# Patient Record
Sex: Male | Born: 1955 | Race: White | Hispanic: No | Marital: Married | Smoking: Never smoker
Health system: Southern US, Community
[De-identification: ages and names within clinical notes are randomized; demographics above are authoritative.]

## PROBLEM LIST (undated history)

## (undated) DIAGNOSIS — K219 Gastro-esophageal reflux disease without esophagitis: Secondary | ICD-10-CM

## (undated) DIAGNOSIS — N200 Calculus of kidney: Secondary | ICD-10-CM

## (undated) DIAGNOSIS — E785 Hyperlipidemia, unspecified: Secondary | ICD-10-CM

## (undated) DIAGNOSIS — K635 Polyp of colon: Secondary | ICD-10-CM

## (undated) DIAGNOSIS — Z87442 Personal history of urinary calculi: Secondary | ICD-10-CM

## (undated) HISTORY — PX: APPENDECTOMY: SHX54

## (undated) HISTORY — DX: Hyperlipidemia, unspecified: E78.5

## (undated) HISTORY — PX: VASECTOMY: SHX75

## (undated) HISTORY — DX: Calculus of kidney: N20.0

## (undated) HISTORY — PX: CORONARY ANGIOPLASTY: SHX604

## (undated) HISTORY — DX: Gastro-esophageal reflux disease without esophagitis: K21.9

## (undated) HISTORY — DX: Polyp of colon: K63.5

---

## 2007-03-29 DIAGNOSIS — Z8249 Family history of ischemic heart disease and other diseases of the circulatory system: Secondary | ICD-10-CM | POA: Insufficient documentation

## 2007-03-30 ENCOUNTER — Ambulatory Visit: Payer: Self-pay | Admitting: Family Medicine

## 2007-03-30 LAB — HM HIV SCREENING LAB: HM HIV SCREENING: NEGATIVE

## 2007-07-08 DIAGNOSIS — B029 Zoster without complications: Secondary | ICD-10-CM

## 2007-07-08 HISTORY — DX: Zoster without complications: B02.9

## 2009-06-28 ENCOUNTER — Ambulatory Visit: Payer: Self-pay | Admitting: General Surgery

## 2009-06-28 HISTORY — PX: UPPER GI ENDOSCOPY: SHX6162

## 2009-06-28 HISTORY — PX: COLONOSCOPY: SHX174

## 2009-09-04 ENCOUNTER — Ambulatory Visit: Payer: Self-pay | Admitting: Internal Medicine

## 2009-11-11 DIAGNOSIS — G4700 Insomnia, unspecified: Secondary | ICD-10-CM | POA: Insufficient documentation

## 2009-11-11 DIAGNOSIS — E782 Mixed hyperlipidemia: Secondary | ICD-10-CM | POA: Insufficient documentation

## 2010-11-02 ENCOUNTER — Emergency Department: Payer: Self-pay | Admitting: Emergency Medicine

## 2013-03-17 ENCOUNTER — Encounter: Payer: Self-pay | Admitting: *Deleted

## 2014-04-18 ENCOUNTER — Ambulatory Visit: Payer: Self-pay | Admitting: Family Medicine

## 2014-04-25 ENCOUNTER — Ambulatory Visit: Payer: Self-pay | Admitting: Family Medicine

## 2014-07-18 ENCOUNTER — Encounter: Payer: Self-pay | Admitting: General Surgery

## 2014-07-19 ENCOUNTER — Ambulatory Visit (INDEPENDENT_AMBULATORY_CARE_PROVIDER_SITE_OTHER): Payer: Managed Care, Other (non HMO) | Admitting: General Surgery

## 2014-07-19 ENCOUNTER — Encounter: Payer: Self-pay | Admitting: General Surgery

## 2014-07-19 VITALS — BP 118/70 | HR 68 | Resp 12 | Ht 70.0 in | Wt 202.0 lb

## 2014-07-19 DIAGNOSIS — Z86010 Personal history of colonic polyps: Secondary | ICD-10-CM

## 2014-07-19 DIAGNOSIS — K219 Gastro-esophageal reflux disease without esophagitis: Secondary | ICD-10-CM

## 2014-07-19 MED ORDER — POLYETHYLENE GLYCOL 3350 17 GM/SCOOP PO POWD: ORAL | Status: DC

## 2014-07-19 NOTE — Progress Notes (Signed)
Patient ID: Richard Warren, male   DOB: 11-23-1955, 58 y.o.   MRN: [id number]  Chief Complaint  Patient presents with  . Colonoscopy    HPI Richard Warren is a 58 y.o. male.  Here today to discuss having a colonoscopy. Last completed 06-28-09. Denies any gastrointestinal issues. He does states he has had increased heartburn over the past 2 months. He uses Zantac OTC.    HPI  Past Medical History  Diagnosis Date  . Hyperlipidemia   . GERD (gastroesophageal reflux disease)   . Kidney stone   . Colon polyp     Past Surgical History  Procedure Laterality Date  . Vasectomy    . Appendectomy  1968?  Marland Kitchen Colonoscopy  06-28-09    Dr Bary Castilla  . Upper gi endoscopy  06-28-09    Dr Bary Castilla    No family history on file.  Social History History  Substance Use Topics  . Smoking status: Never Smoker   . Smokeless tobacco: Never Used  . Alcohol Use: 0.0 oz/week    0 Not specified per week     Comment: occasionally    No Known Allergies  Current Outpatient Prescriptions  Medication Sig Dispense Refill  . B Complex Vitamins (VITAMIN-B COMPLEX PO) Take by mouth.    . Cholecalciferol (VITAMIN D-3 PO) Take by mouth.    Marland Kitchen CINNAMON PO Take by mouth.    . Coenzyme Q10 (CO Q 10 PO) Take by mouth.    . Cyanocobalamin (VITAMIN B12 PO) Take by mouth daily.    . Lactobacillus (ACIDOPHILUS PO) Take by mouth.    . Milk Thistle 250 MG CAPS Take by mouth.    . Multiple Vitamin (MULTIVITAMIN) capsule Take 1 capsule by mouth daily.    . Multiple Vitamins-Minerals (MEGA BASIC PO) Take by mouth.    . Omega-3 Fatty Acids (FISH OIL) 1200 MG CAPS Take by mouth daily.    . ranitidine (ZANTAC) 150 MG tablet Take 150 mg by mouth at bedtime.    . Selenium 200 MCG CAPS Take by mouth daily.    . vitamin E 400 UNIT capsule Take 400 Units by mouth daily.    . polyethylene glycol powder (GLYCOLAX/MIRALAX) powder 255 grams one bottle for colonoscopy prep 255 g 0   No current facility-administered  medications for this visit.    Review of Systems Review of Systems  Constitutional: Negative.   Respiratory: Negative.   Cardiovascular: Negative.     Blood pressure 118/70, pulse 68, resp. rate 12, height 5\' 10"  (1.778 m), weight 202 lb (91.627 kg).  Physical Exam Physical Exam  Constitutional: He is oriented to person, place, and time. He appears well-developed and well-nourished.  Neck: Neck supple.  Cardiovascular: Normal rate, regular rhythm and normal heart sounds.   Pulmonary/Chest: Effort normal and breath sounds normal.  Lymphadenopathy:    He has no cervical adenopathy.  Neurological: He is alert and oriented to person, place, and time.  Skin: Skin is warm and dry.    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  Upper endoscopy showed mild chronic and focal erosive gastritis. Distal esophagus biopsies showed reactive appearing squamous mucosa. No columnar mucosa identified.  No dysplasia.  Assessment    Worsening reflux symptoms.  Previous colonic polyps.     Plan    Repeat upper and lower endoscopy.   Colonoscopy with possible biopsy/polypectomy prn: Information regarding the procedure, including its potential risks and complications (including but not limited to  perforation of the bowel, which may require emergency surgery to repair, and bleeding) was verbally given to the patient. Educational information regarding lower instestinal endoscopy was given to the patient. Written instructions for how to complete the bowel prep using Miralax were provided. The importance of drinking ample fluids to avoid dehydration as a result of the prep emphasized.     Discussed upper endoscopy risk and benefits.  Recommend trying prevacid 15 mg daily in place of Zantac for reflux.  Patient has been scheduled for an upper and lower endoscopy on 08-14-14 at [facility]. It is okay for patient to continue 81 mg aspirin. He has been asked to discontinue fish  oil one week prior to procedure.    PCP: Nolene Bernheim 07/20/2014, 9:30 PM

## 2014-07-19 NOTE — Patient Instructions (Addendum)
Esophagogastroduodenoscopy Esophagogastroduodenoscopy (EGD) is a procedure to examine the lining of the esophagus, stomach, and first part of the small intestine (duodenum). A long, flexible, lighted tube with a camera attached (endoscope) is inserted down the throat to view these organs. This procedure is done to detect problems or abnormalities, such as inflammation, bleeding, ulcers, or growths, in order to treat them. The procedure lasts about 5-20 minutes. It is usually an outpatient procedure, but it may need to be performed in emergency cases in the hospital. LET YOUR CAREGIVER KNOW ABOUT:   Allergies to food or medicine.  All medicines you are taking, including vitamins, herbs, eyedrops, and over-the-counter medicines and creams.  Use of steroids (by mouth or creams).  Previous problems you or members of your family have had with the use of anesthetics.  Any blood disorders you have.  Previous surgeries you have had.  Other health problems you have.  Possibility of pregnancy, if this applies. RISKS AND COMPLICATIONS  Generally, EGD is a safe procedure. However, as with any procedure, complications can occur. Possible complications include:  Infection.  Bleeding.  Tearing (perforation) of the esophagus, stomach, or duodenum.  Difficulty breathing or not being able to breath.  Excessive sweating.  Spasms of the larynx.  Slowed heartbeat.  Low blood pressure. BEFORE THE PROCEDURE  Do not eat or drink anything for 6-8 hours before the procedure or as directed by your caregiver.  Ask your caregiver about changing or stopping your regular medicines.  If you wear dentures, be prepared to remove them before the procedure.  Arrange for someone to drive you home after the procedure. PROCEDURE   A vein will be accessed to give medicines and fluids. A medicine to relax you (sedative) and a pain reliever will be given through that access into the vein.  A numbing medicine  (local anesthetic) may be sprayed on your throat for comfort and to stop you from gagging or coughing.  A mouth guard may be placed in your mouth to protect your teeth and to keep you from biting on the endoscope.  You will be asked to lie on your left side.  The endoscope is inserted down your throat and into the esophagus, stomach, and duodenum.  Air is put through the endoscope to allow your caregiver to view the lining of your esophagus clearly.  The esophagus, stomach, and duodenum is then examined. During the exam, your caregiver may:  Remove tissue to be examined under a microscope (biopsy) for inflammation, infection, or other medical problems.  Remove growths.  Remove objects (foreign bodies) that are stuck.  Treat any bleeding with medicines or other devices that stop tissues from bleeding (hot cautery, clipping devices).  Widen (dilate) or stretch narrowed areas of the esophagus and stomach.  The endoscope will then be withdrawn. AFTER THE PROCEDURE  You will be taken to a recovery area to be monitored. You will be able to go home once you are stable and alert.  Do not eat or drink anything until the local anesthetic and numbing medicines have worn off. You may choke.  It is normal to feel bloated, have pain with swallowing, or have a sore throat for a short time. This will wear off.  Your caregiver should be able to discuss his or her findings with you. It will take longer to discuss the test results if any biopsies were taken. Document Released: 12/25/2004 Document Revised: 01/08/2014 Document Reviewed: 07/27/2012 [facility] Patient Information 2015 [place], [place]. This information is not  intended to replace advice given to you by your health care provider. Make sure you discuss any questions you have with your health care provider.   Colonoscopy A colonoscopy is an exam to look at the entire large intestine (colon). This exam can help find problems such as tumors,  polyps, inflammation, and areas of bleeding. The exam takes about 1 hour.  LET [facility] CARE PROVIDER KNOW ABOUT:   Any allergies you have.  All medicines you are taking, including vitamins, herbs, eye drops, creams, and over-the-counter medicines.  Previous problems you or members of your family have had with the use of anesthetics.  Any blood disorders you have.  Previous surgeries you have had.  Medical conditions you have. RISKS AND COMPLICATIONS  Generally, this is a safe procedure. However, as with any procedure, complications can occur. Possible complications include:  Bleeding.  Tearing or rupture of the colon wall.  Reaction to medicines given during the exam.  Infection (rare). BEFORE THE PROCEDURE   Ask your health care provider about changing or stopping your regular medicines.  You may be prescribed an oral bowel prep. This involves drinking a large amount of medicated liquid, starting the day before your procedure. The liquid will cause you to have multiple loose stools until your stool is almost clear or light green. This cleans out your colon in preparation for the procedure.  Do not eat or drink anything else once you have started the bowel prep, unless your health care provider tells you it is safe to do so.  Arrange for someone to drive you home after the procedure. PROCEDURE   You will be given medicine to help you relax (sedative).  You will lie on your side with your knees bent.  A long, flexible tube with a light and camera on the end (colonoscope) will be inserted through the rectum and into the colon. The camera sends video back to a computer screen as it moves through the colon. The colonoscope also releases carbon dioxide gas to inflate the colon. This helps your health care provider see the area better.  During the exam, your health care provider may take a small tissue sample (biopsy) to be examined under a microscope if any abnormalities are  found.  The exam is finished when the entire colon has been viewed. AFTER THE PROCEDURE   Do not drive for 24 hours after the exam.  You may have a small amount of blood in your stool.  You may pass moderate amounts of gas and have mild abdominal cramping or bloating. This is caused by the gas used to inflate your colon during the exam.  Ask when your test results will be ready and how you will get your results. Make sure you get your test results. Document Released: 08/21/2000 Document Revised: 06/14/2013 Document Reviewed: 05/01/2013 [facility] Patient Information 2015 [place], [place]. This information is not intended to replace advice given to you by your health care provider. Make sure you discuss any questions you have with your health care provider.  Patient has been scheduled for an upper and lower endoscopy on 08-14-14 at [facility]. It is okay for patient to continue 81 mg aspirin. He has been asked to discontinue fish oil one week prior to procedure.

## 2014-07-20 ENCOUNTER — Other Ambulatory Visit: Payer: Self-pay | Admitting: General Surgery

## 2014-07-20 DIAGNOSIS — Z86010 Personal history of colonic polyps: Secondary | ICD-10-CM

## 2014-07-20 DIAGNOSIS — K219 Gastro-esophageal reflux disease without esophagitis: Secondary | ICD-10-CM

## 2014-08-14 ENCOUNTER — Ambulatory Visit: Payer: Self-pay | Admitting: General Surgery

## 2014-08-14 DIAGNOSIS — Z86010 Personal history of colonic polyps: Secondary | ICD-10-CM

## 2014-08-14 DIAGNOSIS — K210 Gastro-esophageal reflux disease with esophagitis: Secondary | ICD-10-CM

## 2014-08-14 DIAGNOSIS — D122 Benign neoplasm of ascending colon: Secondary | ICD-10-CM

## 2014-08-15 ENCOUNTER — Encounter: Payer: Self-pay | Admitting: General Surgery

## 2014-08-16 ENCOUNTER — Telehealth: Payer: Self-pay | Admitting: *Deleted

## 2014-08-16 ENCOUNTER — Encounter: Payer: Self-pay | Admitting: General Surgery

## 2014-08-16 NOTE — Telephone Encounter (Signed)
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 She states she will tell him when he wakes up (he works third shift) Discussed follow-up appointments. Placed in recalls.

## 2014-08-16 NOTE — Telephone Encounter (Signed)
-----   Message from Robert Bellow, MD sent at 08/16/2014 12:23 PM EST ----- Please notify the patient the biopsies of the esophagus showed some reflux changes, nothing severe. Colon biopsy did show one polyp that will require a recheck in three years. (Sorry).  Please put in recalls. He should continue his present PPI and avoid eating late at night.

## 2014-12-31 LAB — SURGICAL PATHOLOGY

## 2015-04-02 ENCOUNTER — Encounter: Payer: Self-pay | Admitting: Family Medicine

## 2015-04-02 ENCOUNTER — Ambulatory Visit (INDEPENDENT_AMBULATORY_CARE_PROVIDER_SITE_OTHER): Payer: Managed Care, Other (non HMO) | Admitting: Family Medicine

## 2015-04-02 ENCOUNTER — Ambulatory Visit
Admission: RE | Admit: 2015-04-02 | Discharge: 2015-04-02 | Disposition: A | Discharge status: Home / Self Care | Payer: Managed Care, Other (non HMO) | Source: Ambulatory Visit | Attending: Family Medicine | Admitting: Family Medicine

## 2015-04-02 ENCOUNTER — Other Ambulatory Visit: Payer: Self-pay | Admitting: Family Medicine

## 2015-04-02 VITALS — BP 118/82 | HR 84 | Temp 98.0°F | Resp 16 | Wt 206.6 lb

## 2015-04-02 DIAGNOSIS — N200 Calculus of kidney: Secondary | ICD-10-CM | POA: Insufficient documentation

## 2015-04-02 DIAGNOSIS — R030 Elevated blood-pressure reading, without diagnosis of hypertension: Secondary | ICD-10-CM | POA: Insufficient documentation

## 2015-04-02 DIAGNOSIS — R1311 Dysphagia, oral phase: Secondary | ICD-10-CM | POA: Insufficient documentation

## 2015-04-02 DIAGNOSIS — M545 Low back pain, unspecified: Secondary | ICD-10-CM

## 2015-04-02 DIAGNOSIS — G4700 Insomnia, unspecified: Secondary | ICD-10-CM | POA: Diagnosis not present

## 2015-04-02 DIAGNOSIS — R42 Dizziness and giddiness: Secondary | ICD-10-CM | POA: Insufficient documentation

## 2015-04-02 DIAGNOSIS — Z87442 Personal history of urinary calculi: Secondary | ICD-10-CM

## 2015-04-02 DIAGNOSIS — E7800 Pure hypercholesterolemia, unspecified: Secondary | ICD-10-CM | POA: Insufficient documentation

## 2015-04-02 LAB — POCT URINALYSIS DIPSTICK
Bilirubin, UA: NEGATIVE
Blood, UA: NEGATIVE
Glucose, UA: NEGATIVE
KETONES UA: NEGATIVE
Leukocytes, UA: NEGATIVE
Nitrite, UA: NEGATIVE
PH UA: 5
PROTEIN UA: NEGATIVE
Spec Grav, UA: >=1.03
Urobilinogen, UA: 0.2

## 2015-04-02 MED ORDER — TRAZODONE HCL 150 MG PO TABS: 150.0000 mg | ORAL_TABLET | Freq: Every day | ORAL | Status: DC

## 2015-04-02 NOTE — Progress Notes (Signed)
Subjective:    Patient ID: Richard Warren, male    DOB: [birth year], 59 y.o.   MRN: [id number] Chief Complaint  Patient presents with  . Back Pain    lower left X 2 days    HPI  This 59 year old male developed left low back and flank pain 2 days ago. Pain was worse/more intense yesterday but has nearly resolved today. Has had some constipation yesterday and continues to use stool softeners. No hematuria, hematemesis, hematochezia or dysuria. Worried about movement of a known left renal stone identified August 2015. Denies recent strain or injury to back. No fever, rash, recent tick bite, cough or congestion. Slight nausea without vomiting yesterday with intense left flank pain. Some soreness in left side of abdomen and lower back muscles today.  Past Medical History  Diagnosis Date  . Hyperlipidemia   . GERD (gastroesophageal reflux disease)   . Kidney stone   . Colon polyp    Past Surgical History  Procedure Laterality Date  . Vasectomy    . Appendectomy  1968?  Marland Kitchen Colonoscopy  06-28-09    Dr Bary Castilla  . Upper gi endoscopy  06-28-09    Dr Bary Castilla   Family History  Problem Relation Age of Onset  . Dementia Mother   . Heart attack Sister   . Hypertension Brother   . Coronary artery disease Sister    History  Substance Use Topics  . Smoking status: Never Smoker   . Smokeless tobacco: Never Used  . Alcohol Use: 0.0 oz/week    0 Standard drinks or equivalent per week     Comment: occasionally    Current Outpatient Prescriptions on File Prior to Visit  Medication Sig Dispense Refill  . B Complex Vitamins (VITAMIN-B COMPLEX PO) Take by mouth.    . Cholecalciferol (VITAMIN D-3 PO) Take by mouth.    Marland Kitchen CINNAMON PO Take by mouth.    . Coenzyme Q10 (CO Q 10 PO) Take by mouth.    . Cyanocobalamin (VITAMIN B12 PO) Take by mouth daily.    . Lactobacillus (ACIDOPHILUS PO) Take by mouth.    . Milk Thistle 250 MG CAPS Take by mouth.    . Multiple Vitamin (MULTIVITAMIN) capsule  Take 1 capsule by mouth daily.    . Multiple Vitamins-Minerals (MEGA BASIC PO) Take by mouth.    . Omega-3 Fatty Acids (FISH OIL) 1200 MG CAPS Take by mouth daily.    . polyethylene glycol powder (GLYCOLAX/MIRALAX) powder 255 grams one bottle for colonoscopy prep 255 g 0  . Selenium 200 MCG CAPS Take by mouth daily.    . vitamin E 400 UNIT capsule Take 400 Units by mouth daily.     No current facility-administered medications on file prior to visit.   Allergies  Allergen Reactions  . Zolpidem     Depression   Review of Systems  Constitutional: Negative.   HENT: Negative.   Respiratory: Negative.   Cardiovascular: Negative.   Gastrointestinal: Positive for nausea.       Slight nausea with initial pain in back and left flank  Genitourinary: Negative.   Musculoskeletal: Positive for back pain.  Neurological: Negative.       BP 118/82 mmHg  Pulse 84  Temp(Src) 98 F (36.7 C) (Oral)  Resp 16  Wt 206 lb 9.6 oz (93.713 kg)    Objective:   Physical Exam  Constitutional: He is oriented to person, place, and time. He appears well-developed and well-nourished. No distress.  HENT:  Head: Normocephalic and atraumatic.  Right Ear: Hearing normal.  Left Ear: Hearing normal.  Nose: Nose normal.  Eyes: Conjunctivae and lids are normal. Right eye exhibits no discharge. Left eye exhibits no discharge. No scleral icterus.  Cardiovascular: Normal rate and regular rhythm.   Pulmonary/Chest: Effort normal and breath sounds normal. No respiratory distress.  Abdominal: Soft. Bowel sounds are normal. He exhibits no mass. There is tenderness.  Tenderness to deep palpation right and left lower quadrants   Musculoskeletal: He exhibits tenderness.  Soreness to palpate left lower back. No CVA tenderness to percussion posteriorly. SLR's 80 degrees without pain. Decreased ROM/stiffness. Pain with lateral flexion bilaterally. Good lower extremity strength.  Neurological: He is alert and oriented to  person, place, and time. He has normal reflexes.  Skin: Skin is intact. No lesion and no rash noted.  Psychiatric: He has a normal mood and affect. His speech is normal and behavior is normal. Thought content normal.      Assessment & Plan:  1. Left-sided low back pain without sciatica Onset over the past 2 days. No hematuria. Urinalysis shows concentrated urine with mucus and a few RBC's and WBC's. Will get urine culture and check KUB with history of left renal stone. May use Advil or Aleve for back soreness. Pain has nearly resolved since attack yesterday. Recommend he increase fluid intake and work on weight loss. Recheck pending reports. - POCT urinalysis dipstick - Urine culture - CBC with Differential/Platelet - Comprehensive metabolic panel - DG Abd 2 Views  2. Personal history of kidney stones Diagnosed with a 4 mm left renal stone. No hematuria but having back and flank pain similar to episode August 2015 when CT scan showed stone. Using extra antacids (calcium based) and PPI for dyspepsia/reflux. Will get labs and abdominal x-ray to check on position of stone (may have moved and cause recent pain. - CBC with Differential/Platelet - Comprehensive metabolic panel - DG Abd 2 Views  3. Insomnia, persistent Still having difficulty staying asleep. 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 Has been off the Trazodone for 6-9 months. Will refill medication (increase Trazodone to 150 mg hs), encouraged to exercise, limit caffeine and limit energy drinks. Recheck in 3 months.

## 2015-04-02 NOTE — Patient Instructions (Signed)
Kidney Stones Kidney stones (urolithiasis) are deposits that form inside your kidneys. The intense pain is caused by the stone moving through the urinary tract. When the stone moves, the ureter goes into spasm around the stone. The stone is usually passed in the urine.  CAUSES   A disorder that makes certain neck glands produce too much parathyroid hormone (primary hyperparathyroidism).  A buildup of uric acid crystals, similar to gout in your joints.  Narrowing (stricture) of the ureter.  A kidney obstruction present at birth (congenital obstruction).  Previous surgery on the kidney or ureters.  Numerous kidney infections. SYMPTOMS   Feeling sick to your stomach (nauseous).  Throwing up (vomiting).  Blood in the urine (hematuria).  Pain that usually spreads (radiates) to the groin.  Frequency or urgency of urination. DIAGNOSIS   Taking a history and physical exam.  Blood or urine tests.  CT scan.  Occasionally, an examination of the inside of the urinary bladder (cystoscopy) is performed. TREATMENT   Observation.  Increasing your fluid intake.  Extracorporeal shock wave lithotripsy--This is a noninvasive procedure that uses shock waves to break up kidney stones.  Surgery may be needed if you have severe pain or persistent obstruction. There are various surgical procedures. Most of the procedures are performed with the use of small instruments. Only small incisions are needed to accommodate these instruments, so recovery time is minimized. The size, location, and chemical composition are all important variables that will determine the proper choice of action for you. Talk to your health care provider to better understand your situation so that you will minimize the risk of injury to yourself and your kidney.  HOME CARE INSTRUCTIONS   Drink enough water and fluids to keep your urine clear or pale yellow. This will help you to pass the stone or stone fragments.  Strain  all urine through the provided strainer. Keep all particulate matter and stones for your health care provider to see. The stone causing the pain may be as small as a grain of salt. It is very important to use the strainer each and every time you pass your urine. The collection of your stone will allow your health care provider to analyze it and verify that a stone has actually passed. The stone analysis will often identify what you can do to reduce the incidence of recurrences.  Only take over-the-counter or prescription medicines for pain, discomfort, or fever as directed by your health care provider.  Make a follow-up appointment with your health care provider as directed.  Get follow-up X-rays if required. The absence of pain does not always mean that the stone has passed. It may have only stopped moving. If the urine remains completely obstructed, it can cause loss of kidney function or even complete destruction of the kidney. It is your responsibility to make sure X-rays and follow-ups are completed. Ultrasounds of the kidney can show blockages and the status of the kidney. Ultrasounds are not associated with any radiation and can be performed easily in a matter of minutes. SEEK MEDICAL CARE IF:  You experience pain that is progressive and unresponsive to any pain medicine you have been prescribed. SEEK IMMEDIATE MEDICAL CARE IF:   Pain cannot be controlled with the prescribed medicine.  You have a fever or shaking chills.  The severity or intensity of pain increases over 18 hours and is not relieved by pain medicine.  You develop a new onset of abdominal pain.  You feel faint or pass out.  You are unable to urinate. MAKE SURE YOU:   Understand these instructions.  Will watch your condition.  Will get help right away if you are not doing well or get worse. Document Released: 08/24/2005 Document Revised: 04/26/2013 Document Reviewed: 01/25/2013 [facility] Patient Information 2015  [place], [place]. This information is not intended to replace advice given to you by your health care provider. Make sure you discuss any questions you have with your health care provider. Insomnia Insomnia is frequent trouble falling and/or staying asleep. Insomnia can be a long term problem or a short term problem. Both are common. Insomnia can be a short term problem when the wakefulness is related to a certain stress or worry. Long term insomnia is often related to ongoing stress during waking hours and/or poor sleeping habits. Overtime, sleep deprivation itself can make the problem worse. Every little thing feels more severe because you are overtired and your ability to cope is decreased. CAUSES   Stress, anxiety, and depression.  Poor sleeping habits.  Distractions such as TV in the bedroom.  Naps close to bedtime.  Engaging in emotionally charged conversations before bed.  Technical reading before sleep.  Alcohol and other sedatives. They may make the problem worse. They can hurt normal sleep patterns and normal dream activity.  Stimulants such as caffeine for several hours prior to bedtime.  Pain syndromes and shortness of breath can cause insomnia.  Exercise late at night.  Changing time zones may cause sleeping problems (jet lag). It is sometimes helpful to have someone observe your sleeping patterns. They should look for periods of not breathing during the night (sleep apnea). They should also look to see how long those periods last. If you live alone or observers are uncertain, you can also be observed at a sleep clinic where your sleep patterns will be professionally monitored. Sleep apnea requires a checkup and treatment. Give your caregivers your medical history. Give your caregivers observations your family has made about your sleep.  SYMPTOMS   Not feeling rested in the morning.  Anxiety and restlessness at bedtime.  Difficulty falling and staying asleep. TREATMENT    Your caregiver may prescribe treatment for an underlying medical disorders. Your caregiver can give advice or help if you are using alcohol or other drugs for self-medication. Treatment of underlying problems will usually eliminate insomnia problems.  Medications can be prescribed for short time use. They are generally not recommended for lengthy use.  Over-the-counter sleep medicines are not recommended for lengthy use. They can be habit forming.  You can promote easier sleeping by making lifestyle changes such as:  Using relaxation techniques that help with breathing and reduce muscle tension.  Exercising earlier in the day.  Changing your diet and the time of your last meal. No night time snacks.  Establish a regular time to go to bed.  Counseling can help with stressful problems and worry.  Soothing music and white noise may be helpful if there are background noises you cannot remove.  Stop tedious detailed work at least one hour before bedtime. HOME CARE INSTRUCTIONS   Keep a diary. Inform your caregiver about your progress. This includes any medication side effects. See your caregiver regularly. Take note of:  Times when you are asleep.  Times when you are awake during the night.  The quality of your sleep.  How you feel the next day. This information will help your caregiver care for you.  Get out of bed if you are still awake after  15 minutes. Read or do some quiet activity. Keep the lights down. Wait until you feel sleepy and go back to bed.  Keep regular sleeping and waking hours. Avoid naps.  Exercise regularly.  Avoid distractions at bedtime. Distractions include watching television or engaging in any intense or detailed activity like attempting to balance the household checkbook.  Develop a bedtime ritual. Keep a familiar routine of bathing, brushing your teeth, climbing into bed at the same time each night, listening to soothing music. Routines increase the  success of falling to sleep faster.  Use relaxation techniques. This can be using breathing and muscle tension release routines. It can also include visualizing peaceful scenes. You can also help control troubling or intruding thoughts by keeping your mind occupied with boring or repetitive thoughts like the old concept of counting sheep. You can make it more creative like imagining planting one beautiful flower after another in your backyard garden.  During your day, work to eliminate stress. When this is not possible use some of the previous suggestions to help reduce the anxiety that accompanies stressful situations. MAKE SURE YOU:   Understand these instructions.  Will watch your condition.  Will get help right away if you are not doing well or get worse. Document Released: 08/21/2000 Document Revised: 11/16/2011 Document Reviewed: 09/21/2007 [facility] Patient Information 2015 [place], [place]. This information is not intended to replace advice given to you by your health care provider. Make sure you discuss any questions you have with your health care provider.

## 2015-04-03 ENCOUNTER — Telehealth: Payer: Self-pay

## 2015-04-03 LAB — COMPREHENSIVE METABOLIC PANEL
ALBUMIN: 4.5 g/dL (ref 3.5–5.5)
ALT: 33 IU/L (ref 0–44)
AST: 16 IU/L (ref 0–40)
Albumin/Globulin Ratio: 1.7 (ref 1.1–2.5)
Alkaline Phosphatase: 49 IU/L (ref 39–117)
BILIRUBIN TOTAL: 0.5 mg/dL (ref 0.0–1.2)
BUN/Creatinine Ratio: 18 (ref 9–20)
BUN: 15 mg/dL (ref 6–24)
CHLORIDE: 102 mmol/L (ref 97–108)
CO2: 21 mmol/L (ref 18–29)
CREATININE: 0.85 mg/dL (ref 0.76–1.27)
Calcium: 9.5 mg/dL (ref 8.7–10.2)
GFR calc non Af Amer: 97 mL/min/{1.73_m2} (ref >59)
GFR, EST AFRICAN AMERICAN: 112 mL/min/{1.73_m2} (ref >59)
GLOBULIN, TOTAL: 2.7 g/dL (ref 1.5–4.5)
Glucose: 106 mg/dL — ABNORMAL HIGH (ref 65–99)
POTASSIUM: 4.6 mmol/L (ref 3.5–5.2)
SODIUM: 145 mmol/L — AB (ref 134–144)
Total Protein: 7.2 g/dL (ref 6.0–8.5)

## 2015-04-03 LAB — CBC WITH DIFFERENTIAL/PLATELET
BASOS: 0 %
Basophils Absolute: 0 10*3/uL (ref 0.0–0.2)
EOS (ABSOLUTE): 0.1 10*3/uL (ref 0.0–0.4)
Eos: 3 %
Hematocrit: 45.9 % (ref 37.5–51.0)
Hemoglobin: 15.6 g/dL (ref 12.6–17.7)
Immature Grans (Abs): 0 10*3/uL (ref 0.0–0.1)
Immature Granulocytes: 0 %
LYMPHS: 29 %
Lymphocytes Absolute: 1.5 10*3/uL (ref 0.7–3.1)
MCH: 31.3 pg (ref 26.6–33.0)
MCHC: 34 g/dL (ref 31.5–35.7)
MCV: 92 fL (ref 79–97)
MONOCYTES: 9 %
MONOS ABS: 0.4 10*3/uL (ref 0.1–0.9)
NEUTROS PCT: 59 %
Neutrophils Absolute: 3 10*3/uL (ref 1.4–7.0)
PLATELETS: 225 10*3/uL (ref 150–379)
RBC: 4.98 x10E6/uL (ref 4.14–5.80)
RDW: 14.3 % (ref 12.3–15.4)
WBC: 5 10*3/uL (ref 3.4–10.8)

## 2015-04-03 LAB — URINE CULTURE: Organism ID, Bacteria: NO GROWTH

## 2015-04-03 NOTE — Telephone Encounter (Signed)
-----   Message from Margo [place], [place] sent at 04/02/2015  5:08 PM EDT ----- Richard Warren still in the left kidney without sign of movement. Size seems a little bigger but not in a position to create blockage. Awaiting culture report to see if any infection. Continue to drink extra fluids.

## 2015-04-03 NOTE — Telephone Encounter (Signed)
Patient's wife Inez Catalina returned call. Patient's wife advised as directed below.

## 2015-04-03 NOTE — Telephone Encounter (Signed)
LMTCB

## 2015-04-05 ENCOUNTER — Telehealth: Payer: Self-pay

## 2015-04-05 NOTE — Telephone Encounter (Signed)
-----   Message from [place], [place] sent at 04/05/2015  1:59 PM EDT ----- Blood tests normal and urine culture negative for infection. If back discomfort persists after finishing the medications, will need to recheck in the office to see if urology referral is needed.

## 2015-04-05 NOTE — Telephone Encounter (Signed)
LMTCB

## 2015-04-05 NOTE — Telephone Encounter (Signed)
Patient's wife Inez Catalina advised as directed below. Wife verbalized understanding.

## 2015-08-09 ENCOUNTER — Telehealth: Payer: Self-pay

## 2015-08-09 NOTE — Telephone Encounter (Signed)
Opened in error  Thanks -Jamarrion Budai

## 2015-08-12 ENCOUNTER — Ambulatory Visit: Payer: Self-pay | Admitting: Physician Assistant

## 2015-10-14 ENCOUNTER — Encounter: Payer: Self-pay | Admitting: Family Medicine

## 2015-10-14 ENCOUNTER — Ambulatory Visit (INDEPENDENT_AMBULATORY_CARE_PROVIDER_SITE_OTHER): Payer: Managed Care, Other (non HMO) | Admitting: Family Medicine

## 2015-10-14 VITALS — BP 122/84 | HR 84 | Temp 98.3°F | Resp 16 | Wt 211.2 lb

## 2015-10-14 DIAGNOSIS — E7800 Pure hypercholesterolemia, unspecified: Secondary | ICD-10-CM | POA: Diagnosis not present

## 2015-10-14 NOTE — Progress Notes (Signed)
Patient ID: Richard Warren, male   DOB: 1956/06/20, 60 y.o.   MRN: [id number]   Patient: Richard Warren Male    DOB: [birth year]   60 y.o.   MRN: [id number] Visit Date: 10/14/2015  Today's Provider: Vernie Murders, PA   Chief Complaint  Patient presents with  . Hyperlipidemia  . Follow-up   Subjective:    HPI   Lipid/Cholesterol, Follow-up:   Last seen for this 08/21/2013  Management since that visit includes patient stopped taking Lovastatin.  Last Lipid Panel: 08-21-13     Total cholesterol 182, triglycerides 146, HDL 50, LDL 103 while on Lovastatin 20 mg HS and Krill Oil 500 mg qd with low fat diet.  He reports poor compliance with treatment. He is not having side effects.   Wt Readings from Last 3 Encounters:  10/14/15 211 lb 3.2 oz (95.8 kg)  04/02/15 206 lb 9.6 oz (93.713 kg)  04/18/14 201 lb (91.173 kg)    ------------------------------------------------------------------------ Patient Active Problem List   Diagnosis Date Noted  . Insomnia, persistent 04/02/2015  . Dizziness 04/02/2015  . Dysphagia, oral phase 04/02/2015  . Elevated blood-pressure reading without diagnosis of hypertension 04/02/2015  . Hypercholesteremia 04/02/2015  . Calculus of kidney 04/02/2015  . Esophageal reflux 07/20/2014  . History of colonic polyps 07/20/2014  . 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 Cannot sleep 11/11/2009  . Herpes zoster without complication [id number]  . Family history of cardiovascular disease 03/29/2007   Past Surgical History  Procedure Laterality Date  . Vasectomy    . Appendectomy  1968?  [facility] Colonoscopy  06-28-09    Dr Bary Castilla  . Upper gi endoscopy  06-28-09    Dr Bary Castilla   Family History  Problem Relation Age of Onset  . Dementia Mother   . Heart attack Sister   . Hypertension Brother   . Coronary artery disease Sister      Previous Medications   ASPIRIN 81 MG EC TABLET    Take 1 tablet by mouth daily.   B COMPLEX  VITAMINS (VITAMIN-B COMPLEX PO)    Take by mouth.   CHOLECALCIFEROL (VITAMIN D-3 PO)    Take by mouth.   CINNAMON PO    Take by mouth.   COCONUT OIL PO    Take by mouth.   COENZYME Q10 (CO Q 10 PO)    Take by mouth.   CYANOCOBALAMIN (VITAMIN B12 PO)    Take by mouth daily.   GARLIC PO    Take by mouth.   LACTOBACILLUS (ACIDOPHILUS PO)    Take by mouth.   LOVASTATIN (MEVACOR) 20 MG TABLET    Take 1 tablet by mouth at bedtime. Reported on 10/14/2015   MILK THISTLE 250 MG CAPS    Take by mouth.   MULTIPLE VITAMINS-MINERALS (MEGA BASIC PO)    Take by mouth.   OMEGA-3 FATTY ACIDS (OMEGA 3 PO)    Take 3,000 mg by mouth 2 (two) times daily.   OMEPRAZOLE (PRILOSEC) 10 MG CAPSULE    Take 10 mg by mouth daily.   POLYETHYLENE GLYCOL POWDER (GLYCOLAX/MIRALAX) POWDER    255 grams one bottle for colonoscopy prep   SELENIUM 200 MCG CAPS    Take by mouth daily.   SUCRALFATE (CARAFATE) 1 G TABLET    Take 1 tablet by mouth. Before meals and at bedtime   TRAZODONE (DESYREL) 150 MG TABLET    Take 1 tablet (150 mg total) by mouth at bedtime.   VITAMIN  E 400 UNIT CAPSULE    Take 400 Units by mouth daily.   Allergies  Allergen Reactions  . Zolpidem     Depression    Review of Systems  Constitutional: Positive for activity change.       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  HENT: Negative.   Eyes: Negative.        Corrected vision by glasses.  Respiratory: Negative.   Cardiovascular: Negative.   Gastrointestinal: Negative.   Genitourinary: Negative.   Neurological: Negative.   Psychiatric/Behavioral: Negative.     Social History  Substance Use Topics  . Smoking status: Never Smoker   . Smokeless tobacco: Never Used  . Alcohol Use: 0.0 oz/week    0 Standard drinks or equivalent per week     Comment: occasionally   Objective:   BP 122/84 mmHg  Pulse 84  Temp(Src) 98.3 F (36.8 C) (Oral)  Resp 16  Wt 211 lb 3.2 oz (95.8 kg)  Physical Exam  Constitutional: He  is oriented to person, place, and time. He appears well-developed and well-nourished. No distress.  HENT:  Head: Normocephalic and atraumatic.  Right Ear: Hearing and external ear normal.  Left Ear: Hearing and external ear normal.  Nose: Nose normal.  Eyes: Conjunctivae and lids are normal. Right eye exhibits no discharge. Left eye exhibits no discharge. No scleral icterus.  Neck: Neck supple.  Cardiovascular: Normal rate, regular rhythm and normal heart sounds.   Pulmonary/Chest: Effort normal and breath sounds normal. No respiratory distress.  Abdominal: Soft. Bowel sounds are normal.  Musculoskeletal: Normal range of motion.  Neurological: He is alert and oriented to person, place, and time.  Skin: Skin is intact. No lesion and no rash noted.  Psychiatric: He has a normal mood and affect. His speech is normal and behavior is normal. Thought content normal.    Assessment & Plan:     1. Hypercholesteremia Has been off the Lovastatin for several months. Still taking Krill Oil but has gained 10 lbs in the past 2 years. Recommend labs before restarting Lovastatin and get back on low fat diet with exercise 20-30 minutes 3 times a week. Recheck pending lab reports. - CBC with Differential/Platelet - COMPLETE METABOLIC PANEL WITH GFR - Lipid panel - TSH

## 2015-10-14 NOTE — Patient Instructions (Signed)
Fat and Cholesterol Restricted Diet High levels of fat and cholesterol in your blood may lead to various health problems, such as diseases of the heart, blood vessels, gallbladder, liver, and pancreas. Fats are concentrated sources of energy that come in various forms. Certain types of fat, including saturated fat, may be harmful in excess. Cholesterol is a substance needed by your body in small amounts. Your body makes all the cholesterol it needs. Excess cholesterol comes from the food you eat. When you have high levels of cholesterol and saturated fat in your blood, health problems can develop because the excess fat and cholesterol will gather along the walls of your blood vessels, causing them to narrow. Choosing the right foods will help you control your intake of fat and cholesterol. This will help keep the levels of these substances in your blood within normal limits and reduce your risk of disease. WHAT IS MY PLAN? Your health care provider recommends that you:  Get no more than __________ % of the total calories in your daily diet from fat.  Limit your intake of saturated fat to less than ______% of your total calories each day.  Limit the amount of cholesterol in your diet to less than _________mg per day. WHAT TYPES OF FAT SHOULD I CHOOSE?  Choose healthy fats more often. Choose monounsaturated and polyunsaturated fats, such as olive and canola oil, flaxseeds, walnuts, almonds, and seeds.  Eat more omega-3 fats. Good choices include salmon, mackerel, sardines, tuna, flaxseed oil, and ground flaxseeds. Aim to eat fish at least two times a week.  Limit saturated fats. Saturated fats are primarily found in animal products, such as meats, butter, and cream. Plant sources of saturated fats include palm oil, palm kernel oil, and coconut oil.  Avoid foods with partially hydrogenated oils in them. These contain trans fats. Examples of foods that contain trans fats are stick margarine, some tub  margarines, cookies, crackers, and other baked goods. WHAT GENERAL GUIDELINES DO I NEED TO FOLLOW? These guidelines for healthy eating will help you control your intake of fat and cholesterol:  Check food labels carefully to identify foods with trans fats or high amounts of saturated fat.  Fill one half of your plate with vegetables and green salads.  Fill one fourth of your plate with whole grains. Look for the word "whole" as the first word in the ingredient list.  Fill one fourth of your plate with lean protein foods.  Limit fruit to two servings a day. Choose fruit instead of juice.  Eat more foods that contain soluble fiber. Examples of foods that contain this type of fiber are apples, broccoli, carrots, beans, peas, and barley. Aim to get 20-30 g of fiber per day.  Eat more home-cooked food and less restaurant, buffet, and fast food.  Limit or avoid alcohol.  Limit foods high in starch and sugar.  Limit fried foods.  Cook foods using methods other than frying. Baking, boiling, grilling, and broiling are all great options.  Lose weight if you are overweight. Losing just 5-10% of your initial body weight can help your overall health and prevent diseases such as diabetes and heart disease. WHAT FOODS CAN I EAT? Grains Whole grains, such as whole wheat or whole grain breads, crackers, cereals, and pasta. Unsweetened oatmeal, bulgur, barley, quinoa, or brown rice. Corn or whole wheat flour tortillas. Vegetables Fresh or frozen vegetables (raw, steamed, roasted, or grilled). Green salads. Fruits All fresh, canned (in natural juice), or frozen fruits. Meat and  Other Protein Products Ground beef (85% or leaner), grass-fed beef, or beef trimmed of fat. Skinless chicken or [place]. Ground chicken or [place]. Pork trimmed of fat. All fish and seafood. Eggs. Dried beans, peas, or lentils. Unsalted nuts or seeds. Unsalted canned or dry beans. Dairy Low-fat dairy products, such as skim or  1% milk, 2% or reduced-fat cheeses, low-fat ricotta or cottage cheese, or plain low-fat yogurt. Fats and Oils Tub margarines without trans fats. Light or reduced-fat mayonnaise and salad dressings. Avocado. Olive, canola, sesame, or safflower oils. Natural peanut or almond butter (choose ones without added sugar and oil). The items listed above may not be a complete list of recommended foods or beverages. Contact your dietitian for more options. WHAT FOODS ARE NOT RECOMMENDED? Grains White bread. White pasta. White rice. Cornbread. Bagels, pastries, and croissants. Crackers that contain trans fat. Vegetables White potatoes. Corn. Creamed or fried vegetables. Vegetables in a cheese sauce. Fruits Dried fruits. Canned fruit in light or heavy syrup. Fruit juice. Meat and Other Protein Products Fatty cuts of meat. Ribs, chicken wings, bacon, sausage, bologna, salami, chitterlings, fatback, hot dogs, bratwurst, and packaged luncheon meats. Liver and organ meats. Dairy Whole or 2% milk, cream, half-and-half, and cream cheese. Whole milk cheeses. Whole-fat or sweetened yogurt. Full-fat cheeses. Nondairy creamers and whipped toppings. Processed cheese, cheese spreads, or cheese curds. Sweets and Desserts Corn syrup, sugars, honey, and molasses. Candy. Jam and jelly. Syrup. Sweetened cereals. Cookies, pies, cakes, donuts, muffins, and ice cream. Fats and Oils Butter, stick margarine, lard, shortening, ghee, or bacon fat. Coconut, palm kernel, or palm oils. Beverages Alcohol. Sweetened drinks (such as sodas, lemonade, and fruit drinks or punches). The items listed above may not be a complete list of foods and beverages to avoid. Contact your dietitian for more information.   This information is not intended to replace advice given to you by your health care provider. Make sure you discuss any questions you have with your health care provider.   Document Released: 08/24/2005 Document Revised: 09/14/2014  Document Reviewed: 11/22/2013 Elsevier Interactive Patient Education 2016 [employer]. Exercising to [employer] Exercising regularly is important. It has many health benefits, such as:  Improving your overall fitness, flexibility, and endurance.  Increasing your bone density.  Helping with weight control.  Decreasing your body fat.  Increasing your muscle strength.  Reducing stress and tension.  Improving your overall health. In order to become healthy and stay healthy, it is recommended that you do moderate-intensity and vigorous-intensity exercise. You can tell that you are exercising at a moderate intensity if you have a higher heart rate and faster breathing, but you are still able to hold a conversation. You can tell that you are exercising at a vigorous intensity if you are breathing much harder and faster and cannot hold a conversation while exercising. HOW OFTEN SHOULD I EXERCISE? Choose an activity that you enjoy and set realistic goals. Your health care provider can help you to make an activity plan that works for you. Exercise regularly as directed by your health care provider. This may include:   Doing resistance training twice each week, such as:  Push-ups.  Sit-ups.  Lifting weights.  Using resistance bands.  Doing a given intensity of exercise for a given amount of time. Choose from these options:  150 minutes of moderate-intensity exercise every week.  75 minutes of vigorous-intensity exercise every week.  A mix of moderate-intensity and vigorous-intensity exercise every week. Children, pregnant women, people who are out  of shape, people who are overweight, and older adults may need to consult a health care provider for individual recommendations. If you have any sort of medical condition, be sure to consult your health care provider before starting a new exercise program.  WHAT ARE SOME EXERCISE IDEAS? Some moderate-intensity exercise ideas include:    Walking at a rate of 1 mile in 15 minutes.  Biking.  Hiking.  Golfing.  Dancing. Some vigorous-intensity exercise ideas include:   Walking at a rate of at least 4.5 miles per hour.  Jogging or running at a rate of 5 miles per hour.  Biking at a rate of at least 10 miles per hour.  Lap swimming.  Roller-skating or in-line skating.  Cross-country skiing.  Vigorous competitive sports, such as football, basketball, and soccer.  Jumping rope.  Aerobic dancing. WHAT ARE SOME EVERYDAY ACTIVITIES THAT CAN HELP ME TO GET EXERCISE?  Yard work, such as:  Psychologist, educational.  Raking and bagging leaves.  Washing and waxing your car.  Pushing a stroller.  Shoveling snow.  Gardening.  Washing windows or floors. HOW CAN I BE MORE ACTIVE IN MY DAY-TO-DAY ACTIVITIES?  Use the stairs instead of the elevator.  Take a walk during your lunch break.  If you drive, park your car farther away from work or school.  If you take public transportation, get off one stop early and walk the rest of the way.  Make all of your phone calls while standing up and walking around.  Get up, stretch, and walk around every 30 minutes throughout the day. WHAT GUIDELINES SHOULD I FOLLOW WHILE EXERCISING?  Do not exercise so much that you hurt yourself, feel dizzy, or get very short of breath.  Consult your health care provider before starting a new exercise program.  Wear comfortable clothes and shoes with good support.  Drink plenty of water while you exercise to prevent dehydration or heat stroke. Body water is lost during exercise and must be replaced.  Work out until you breathe faster and your heart beats faster.   This information is not intended to replace advice given to you by your health care provider. Make sure you discuss any questions you have with your health care provider.   Document Released: 09/26/2010 Document Revised: 09/14/2014 Document Reviewed:  01/25/2014 Elsevier Interactive Patient Education [employer].

## 2015-10-15 LAB — CBC WITH DIFFERENTIAL/PLATELET
BASOS: 0 %
Basophils Absolute: 0 10*3/uL (ref 0.0–0.2)
EOS (ABSOLUTE): 0.1 10*3/uL (ref 0.0–0.4)
EOS: 2 %
HEMATOCRIT: 45.9 % (ref 37.5–51.0)
HEMOGLOBIN: 15.6 g/dL (ref 12.6–17.7)
IMMATURE GRANULOCYTES: 1 %
Immature Grans (Abs): 0 10*3/uL (ref 0.0–0.1)
Lymphocytes Absolute: 1.7 10*3/uL (ref 0.7–3.1)
Lymphs: 49 %
MCH: 31.6 pg (ref 26.6–33.0)
MCHC: 34 g/dL (ref 31.5–35.7)
MCV: 93 fL (ref 79–97)
Monocytes Absolute: 0.3 10*3/uL (ref 0.1–0.9)
Monocytes: 8 %
Neutrophils Absolute: 1.4 10*3/uL (ref 1.4–7.0)
Neutrophils: 40 %
PLATELETS: 228 10*3/uL (ref 150–379)
RBC: 4.94 x10E6/uL (ref 4.14–5.80)
RDW: 14.2 % (ref 12.3–15.4)
WBC: 3.5 10*3/uL (ref 3.4–10.8)

## 2015-10-15 LAB — COMPREHENSIVE METABOLIC PANEL
ALBUMIN: 4.5 g/dL (ref 3.5–5.5)
ALK PHOS: 45 IU/L (ref 39–117)
ALT: 28 IU/L (ref 0–44)
AST: 22 IU/L (ref 0–40)
Albumin/Globulin Ratio: 1.8 (ref 1.1–2.5)
BUN / CREAT RATIO: 15 (ref 9–20)
BUN: 13 mg/dL (ref 6–24)
Bilirubin Total: <0.2 mg/dL (ref 0.0–1.2)
CALCIUM: 9.3 mg/dL (ref 8.7–10.2)
CO2: 21 mmol/L (ref 18–29)
CREATININE: 0.85 mg/dL (ref 0.76–1.27)
Chloride: 104 mmol/L (ref 96–106)
GFR calc Af Amer: 111 mL/min/{1.73_m2} (ref >59)
GFR, EST NON AFRICAN AMERICAN: 96 mL/min/{1.73_m2} (ref >59)
GLUCOSE: 89 mg/dL (ref 65–99)
Globulin, Total: 2.5 g/dL (ref 1.5–4.5)
Potassium: 4.7 mmol/L (ref 3.5–5.2)
Sodium: 145 mmol/L — ABNORMAL HIGH (ref 134–144)
Total Protein: 7 g/dL (ref 6.0–8.5)

## 2015-10-15 LAB — LIPID PANEL
Chol/HDL Ratio: 5.4 ratio units — ABNORMAL HIGH (ref 0.0–5.0)
Cholesterol, Total: 216 mg/dL — ABNORMAL HIGH (ref 100–199)
HDL: 40 mg/dL (ref >39)
LDL Calculated: 128 mg/dL — ABNORMAL HIGH (ref 0–99)
Triglycerides: 241 mg/dL — ABNORMAL HIGH (ref 0–149)
VLDL CHOLESTEROL CAL: 48 mg/dL — AB (ref 5–40)

## 2015-10-15 LAB — TSH: TSH: 0.64 u[IU]/mL (ref 0.450–4.500)

## 2015-10-16 ENCOUNTER — Telehealth: Payer: Self-pay

## 2015-10-16 DIAGNOSIS — E785 Hyperlipidemia, unspecified: Secondary | ICD-10-CM

## 2015-10-16 MED ORDER — LOVASTATIN 40 MG PO TABS: 40.0000 mg | ORAL_TABLET | Freq: Every day | ORAL | Status: DC

## 2015-10-16 NOTE — Telephone Encounter (Signed)
-----   Message from [place], [place] sent at 10/15/2015  4:47 PM EST ----- All blood tests normal except cholesterol and triglycerides worse than last check. Need to increase Lovastatin to 40 mg qd #30 & 3 RF with continuation of exercise and low fat diet. Recheck blood levels in 3 months.

## 2015-10-16 NOTE — Telephone Encounter (Signed)
Patient advised as directed below. Patient verbalized understanding and agrees with plan of care. RX sent to pharmacy.  

## 2015-12-10 ENCOUNTER — Encounter: Payer: Self-pay | Admitting: Family Medicine

## 2015-12-10 ENCOUNTER — Ambulatory Visit (INDEPENDENT_AMBULATORY_CARE_PROVIDER_SITE_OTHER): Payer: Managed Care, Other (non HMO) | Admitting: Family Medicine

## 2015-12-10 VITALS — BP 140/92 | HR 78 | Temp 98.8°F | Resp 16 | Wt 207.4 lb

## 2015-12-10 DIAGNOSIS — K219 Gastro-esophageal reflux disease without esophagitis: Secondary | ICD-10-CM | POA: Diagnosis not present

## 2015-12-10 DIAGNOSIS — R1033 Periumbilical pain: Secondary | ICD-10-CM

## 2015-12-10 DIAGNOSIS — J069 Acute upper respiratory infection, unspecified: Secondary | ICD-10-CM

## 2015-12-10 MED ORDER — SUCRALFATE 1 G PO TABS: 1.0000 g | ORAL_TABLET | Freq: Three times a day (TID) | ORAL | Status: DC

## 2015-12-10 NOTE — Progress Notes (Signed)
Patient ID: Richard Warren, male   DOB: 11-Apr-1956, 60 y.o.   MRN: [id number]   Patient: Richard Warren Male    DOB: Aug 22, 1956   60 y.o.   MRN: [id number] Visit Date: 12/10/2015  Today's Provider: Vernie Murders, PA   Chief Complaint  Patient presents with  . Abdominal Pain  . URI   Subjective:    Abdominal Pain This is a recurrent problem. The current episode started 1 to 4 weeks ago. The problem occurs intermittently. The problem has been unchanged. The pain is located in the generalized abdominal region ("heartburn pain/indigestion" since he stopped  taking the Prilosec or Nexium.). The quality of the pain is burning. The abdominal pain does not radiate. Associated symptoms include belching, flatus and hematochezia. Pertinent negatives include no constipation, melena, nausea or vomiting. The pain is aggravated by eating. Treatments tried: Patient was prevously taking Prilosec and stopped. Symptoms worsen after stopping medication  His past medical history is significant for GERD. upper endoscopy on 08-14-14 by Dr. Bary Castilla showed LA Grade A esophagitis.  URI  This is a new problem. The current episode started in the past 7 days. The problem has been unchanged. Associated symptoms include abdominal pain, congestion, coughing and a sore throat. Pertinent negatives include no nausea or vomiting. He has tried nothing for the symptoms.   Past Medical History  Diagnosis Date  . Hyperlipidemia   . GERD (gastroesophageal reflux disease)   . Kidney stone   . Colon polyp    Past Surgical History  Procedure Laterality Date  . Vasectomy    . Appendectomy  1968?  Marland Kitchen Colonoscopy  06-28-09    Dr Bary Castilla  . Upper gi endoscopy  06-28-09    Dr Bary Castilla   Family History  Problem Relation Age of Onset  . Dementia Mother   . Heart attack Sister   . Hypertension Brother   . Coronary artery disease Sister    Previous Medications   ASPIRIN 81 MG EC TABLET    Take 1 tablet by mouth daily.   B  COMPLEX VITAMINS (VITAMIN-B COMPLEX PO)    Take by mouth.   CHOLECALCIFEROL (VITAMIN D-3 PO)    Take by mouth.   CINNAMON PO    Take by mouth.   COCONUT OIL PO    Take by mouth.   COENZYME Q10 (CO Q 10 PO)    Take by mouth.   CYANOCOBALAMIN (VITAMIN B12 PO)    Take by mouth daily.   GARLIC PO    Take by mouth.   LACTOBACILLUS (ACIDOPHILUS PO)    Take by mouth.   LOVASTATIN (MEVACOR) 40 MG TABLET    Take 1 tablet (40 mg total) by mouth at bedtime. Reported on 10/14/2015   MILK THISTLE 250 MG CAPS    Take by mouth.   MULTIPLE VITAMINS-MINERALS (MEGA BASIC PO)    Take by mouth.   OMEGA-3 FATTY ACIDS (OMEGA 3 PO)    Take 3,000 mg by mouth 2 (two) times daily.   OMEPRAZOLE (PRILOSEC) 10 MG CAPSULE    Take 10 mg by mouth daily. Reported on 12/10/2015   POLYETHYLENE GLYCOL POWDER (GLYCOLAX/MIRALAX) POWDER    255 grams one bottle for colonoscopy prep   SELENIUM 200 MCG CAPS    Take by mouth daily.   SUCRALFATE (CARAFATE) 1 G TABLET    Take 1 tablet by mouth. Before meals and at bedtime   TRAZODONE (DESYREL) 150 MG TABLET    Take 1 tablet (150 mg  total) by mouth at bedtime.   VITAMIN E 400 UNIT CAPSULE    Take 400 Units by mouth daily.   Allergies  Allergen Reactions  . Zolpidem     Depression    Review of Systems  Constitutional: Negative.   HENT: Positive for congestion and sore throat.   Eyes: Negative.   Respiratory: Positive for cough.   Cardiovascular: Negative.   Gastrointestinal: Positive for abdominal pain, hematochezia and flatus. Negative for nausea, vomiting, constipation and melena.  Endocrine: Negative.   Genitourinary: Negative.   Musculoskeletal: Negative.   Skin: Negative.   Allergic/Immunologic: Negative.   Neurological: Negative.   Hematological: Negative.   Psychiatric/Behavioral: Negative.     Social History  Substance Use Topics  . Smoking status: Never Smoker   . Smokeless tobacco: Never Used  . Alcohol Use: 0.0 oz/week    0 Standard drinks or equivalent per  week     Comment: occasionally   Objective:   BP 140/92 mmHg  Pulse 78  Temp(Src) 98.8 F (37.1 C) (Oral)  Resp 16  Wt 207 lb 6.4 oz (94.076 kg)  Physical Exam  Constitutional: He is oriented to person, place, and time. He appears well-developed and well-nourished.  HENT:  Head: Normocephalic.  Right Ear: External ear normal.  Left Ear: External ear normal.  Nose: Nose normal.  Slightly irritated and cobblestone appearing posterior pharynx.  Eyes: Conjunctivae and EOM are normal.  Neck: Normal range of motion. Neck supple.  Cardiovascular: Normal rate and regular rhythm.   Pulmonary/Chest: Effort normal and breath sounds normal.  Abdominal: Soft. Bowel sounds are normal. There is tenderness.  Slight periumbilical and epigastric discomfort. No masses. Normal bowel sounds.  Neurological: He is alert and oriented to person, place, and time.  Skin: No rash noted.  Psychiatric: He has a normal mood and affect. His behavior is normal.      Assessment & Plan:     1. Periumbilical abdominal pain Recurrence of indigestion/dyspepsia similar to past discomfort from LA Grade A esophagitis. Has been off his PPI and reports it required Carafate at time. Denies hematemesis or melena. No nausea, vomiting or diarrhea. Will get routine labs and recommend he restart the Nexium he has at home. Restrict fatty or spicy foods and limit caffeine. - Comprehensive metabolic panel  2. Upper respiratory infection Slight cough and congestion over the past few days without fever. Will get CBC with diff. May use Dayquil prn symptoms and increase fluid intake. If antipyretic and analgesic needed, use Tylenol instead of NSAID. - CBC with Differential/Platelet  3. Gastroesophageal reflux disease, esophagitis presence not specified Last upper endoscopy was on 08-14-14 by Dr. Bary Castilla that confirmed LA Grade A esophagitis. With return of dyspepsia, will check H. Pylori and refill Carafate to use with Nexium if  needed. Denies any hematemesis or melena. Recheck pending lab reports. May need referral to GI if no better in a week. - H. pylori breath test - sucralfate (CARAFATE) 1 g tablet; Take 1 tablet (1 g total) by mouth 4 (four) times daily -  with meals and at bedtime. Before meals and at bedtime  Dispense: 90 tablet; Refill: 0

## 2015-12-11 ENCOUNTER — Telehealth: Payer: Self-pay

## 2015-12-11 LAB — COMPREHENSIVE METABOLIC PANEL
ALK PHOS: 44 IU/L (ref 39–117)
ALT: 33 IU/L (ref 0–44)
AST: 22 IU/L (ref 0–40)
Albumin/Globulin Ratio: 1.6 (ref 1.2–2.2)
Albumin: 4.2 g/dL (ref 3.5–5.5)
BILIRUBIN TOTAL: 0.4 mg/dL (ref 0.0–1.2)
BUN/Creatinine Ratio: 20 (ref 9–20)
BUN: 17 mg/dL (ref 6–24)
CHLORIDE: 104 mmol/L (ref 96–106)
CO2: 22 mmol/L (ref 18–29)
Calcium: 9.3 mg/dL (ref 8.7–10.2)
Creatinine, Ser: 0.87 mg/dL (ref 0.76–1.27)
GFR, EST AFRICAN AMERICAN: 110 mL/min/{1.73_m2} (ref >59)
GFR, EST NON AFRICAN AMERICAN: 95 mL/min/{1.73_m2} (ref >59)
GLUCOSE: 90 mg/dL (ref 65–99)
Globulin, Total: 2.6 g/dL (ref 1.5–4.5)
Potassium: 4.3 mmol/L (ref 3.5–5.2)
Sodium: 144 mmol/L (ref 134–144)
Total Protein: 6.8 g/dL (ref 6.0–8.5)

## 2015-12-11 LAB — H. PYLORI BREATH TEST: H. pylori UBiT: NEGATIVE

## 2015-12-11 LAB — CBC WITH DIFFERENTIAL/PLATELET
BASOS ABS: 0 10*3/uL (ref 0.0–0.2)
Basos: 0 %
EOS (ABSOLUTE): 0.1 10*3/uL (ref 0.0–0.4)
Eos: 2 %
Hematocrit: 45.6 % (ref 37.5–51.0)
Hemoglobin: 15.4 g/dL (ref 12.6–17.7)
Immature Grans (Abs): 0 10*3/uL (ref 0.0–0.1)
Immature Granulocytes: 0 %
LYMPHS ABS: 1.5 10*3/uL (ref 0.7–3.1)
Lymphs: 28 %
MCH: 31.3 pg (ref 26.6–33.0)
MCHC: 33.8 g/dL (ref 31.5–35.7)
MCV: 93 fL (ref 79–97)
MONOCYTES: 9 %
Monocytes Absolute: 0.5 10*3/uL (ref 0.1–0.9)
NEUTROS ABS: 3.2 10*3/uL (ref 1.4–7.0)
Neutrophils: 61 %
PLATELETS: 225 10*3/uL (ref 150–379)
RBC: 4.92 x10E6/uL (ref 4.14–5.80)
RDW: 13.9 % (ref 12.3–15.4)
WBC: 5.2 10*3/uL (ref 3.4–10.8)

## 2015-12-11 NOTE — Telephone Encounter (Signed)
Patient advised as directed below. Patient is requesting a note to return to work tonight. Patient advised Simona Huh is out of the office today. Patient states if note can not be written for tonight then it would be okay for tomorrow night. Patient said he is not able to be out of work the whole week.

## 2015-12-11 NOTE — Telephone Encounter (Signed)
-----   Message from [place], [place] sent at 12/11/2015  9:33 AM EDT ----- Normal liver function, no anemia or infection and normal kidney function. Awaiting final report on H.pylori breath test. Proceed with Nexium as planned.

## 2015-12-12 MED ORDER — PSEUDOEPH-BROMPHEN-DM 30-2-10 MG/5ML PO SYRP: 5.0000 mL | ORAL_SOLUTION | Freq: Four times a day (QID) | ORAL | Status: DC | PRN

## 2015-12-12 NOTE — Telephone Encounter (Signed)
Advised patient as below. Patient reports that his cough doesn't seem to be getting any better. He reports that he has been taking the OTC meds that you recommended with no relief. Patient denies any fever or shortness of breath. He reports that his cough is becoming more productive. Patient is requesting that something be called into the pharmacy. Patient uses [facility] on [address]

## 2015-12-12 NOTE — Telephone Encounter (Signed)
Sent cough syrup to the pharmacy. Should recheck in the office in 5-6 days if no better.

## 2015-12-12 NOTE — Telephone Encounter (Signed)
-----   Message from [place], [place] sent at 12/12/2015  8:15 AM EDT ----- H.pylori test negative. Proceed with use of Nexium daily for the next 3-4 weeks and add Carafate if having breakthrough indigestion/heartburn. Recheck in a month.

## 2015-12-12 NOTE — Telephone Encounter (Signed)
Patient advised as directed below. Patient verbalized understanding.  

## 2015-12-12 NOTE — Telephone Encounter (Signed)
Left message to call back  

## 2015-12-13 ENCOUNTER — Telehealth: Payer: Self-pay | Admitting: Family Medicine

## 2015-12-13 NOTE — Telephone Encounter (Signed)
Pt stated he isn't feeling better and would like his work note to be changed to him returning to work Monday night 12/16/15. Pt stated that he doesn't feel like he could drive safely feeling as bad as he does. Please advise. Thanks TNP

## 2015-12-13 NOTE — Telephone Encounter (Signed)
Ok to write work note? Please advise. Thanks!

## 2015-12-13 NOTE — Telephone Encounter (Signed)
Work note printed and ready to be signed. Patient was called and L/M that it was ready.

## 2015-12-13 NOTE — Telephone Encounter (Signed)
That's fine, can change return to work date to Monday 4/10

## 2016-02-10 ENCOUNTER — Other Ambulatory Visit: Payer: Self-pay | Admitting: Family Medicine

## 2016-03-19 ENCOUNTER — Other Ambulatory Visit: Payer: Self-pay | Admitting: Family Medicine

## 2016-03-19 NOTE — Telephone Encounter (Signed)
Please review. Thanks!  

## 2016-03-19 NOTE — Telephone Encounter (Signed)
Pt contacted office for refill request on the following medications:  lovastatin (MEVACOR) 40 MG tablet.  Pt is requesting refills if possible.  [place]  CB#[phone]/MW

## 2016-04-13 ENCOUNTER — Encounter: Payer: Self-pay | Admitting: Family Medicine

## 2016-04-13 ENCOUNTER — Ambulatory Visit (INDEPENDENT_AMBULATORY_CARE_PROVIDER_SITE_OTHER): Payer: Managed Care, Other (non HMO) | Admitting: Family Medicine

## 2016-04-13 VITALS — BP 110/82 | HR 76 | Temp 98.6°F | Resp 16 | Ht 70.0 in | Wt 210.0 lb

## 2016-04-13 DIAGNOSIS — K219 Gastro-esophageal reflux disease without esophagitis: Secondary | ICD-10-CM | POA: Diagnosis not present

## 2016-04-13 DIAGNOSIS — E7800 Pure hypercholesterolemia, unspecified: Secondary | ICD-10-CM | POA: Diagnosis not present

## 2016-04-13 MED ORDER — LOVASTATIN 40 MG PO TABS: ORAL_TABLET | ORAL | 3 refills | Status: DC

## 2016-04-13 NOTE — Progress Notes (Signed)
Patient: Richard Warren Male    DOB: 1956/07/11   60 y.o.   MRN: [id number] Visit Date: 04/13/2016  Today's Provider: Vernie Murders, PA   Chief Complaint  Patient presents with  . Hyperlipidemia   Subjective:    Hyperlipidemia     Lipid/Cholesterol, Follow-up:   Last seen for this6 months ago.  Management changes since that visit include restarting Lovastatin. . Last Lipid Panel:    Component Value Date/Time   CHOL 216 (H) 10/14/2015 0928   TRIG 241 (H) 10/14/2015 0928   HDL 40 10/14/2015 0928   CHOLHDL 5.4 (H) 10/14/2015 0928   LDLCALC 128 (H) 10/14/2015 [id number]    Risk factors for vascular disease include hypercholesterolemia  He reports excellent compliance with treatment. He is not having side effects.  Current symptoms include none and have been stable. Weight trend: increasing steadily Prior visit with dietician: no Current diet: in general, a "healthy" diet   Current exercise: none  Wt Readings from Last 3 Encounters:  04/13/16 210 lb (95.3 kg)  12/10/15 207 lb 6.4 oz (94.1 kg)  10/14/15 211 lb 3.2 oz (95.8 kg)    -------------------------------------------------------------------  Past Medical History:  Diagnosis Date  . Colon polyp   . GERD (gastroesophageal reflux disease)   . Hyperlipidemia   . Kidney stone    Past Surgical History:  Procedure Laterality Date  . APPENDECTOMY  1968?  . COLONOSCOPY  06-28-09   Dr Bary Castilla  . UPPER GI ENDOSCOPY  06-28-09   Dr Bary Castilla  . VASECTOMY     Family History  Problem Relation Age of Onset  . Dementia Mother   . Heart attack Sister   . Hypertension Brother   . Coronary artery disease Sister    Allergies  Allergen Reactions  . Zolpidem     Depression   Current Meds  Medication Sig  . Aspirin 81 MG EC tablet Take 1 tablet by mouth daily.  . B Complex Vitamins (VITAMIN-B COMPLEX PO) Take by mouth.  . brompheniramine-pseudoephedrine-DM 30-2-10 MG/5ML syrup Take 5 mLs by mouth 4 (four)  times daily as needed.  . Cholecalciferol (VITAMIN D-3 PO) Take by mouth.  Marland Kitchen CINNAMON PO Take by mouth.  . COCONUT OIL PO Take by mouth.  . Coenzyme Q10 (CO Q 10 PO) Take by mouth.  . Cyanocobalamin (VITAMIN B12 PO) Take by mouth daily.  Marland Kitchen GARLIC PO Take by mouth.  . Lactobacillus (ACIDOPHILUS PO) Take by mouth.  . lovastatin (MEVACOR) 40 MG tablet TAKE ONE TABLET BY MOUTH AT BEDTIME *DUE  FOR  APPOINTMENT  TO  CHECK  EFFECT  OF  INCREASE  IN  LOVASTATIN  DOSAGE*  . Milk Thistle 250 MG CAPS Take by mouth.  . Multiple Vitamins-Minerals (MEGA BASIC PO) Take by mouth.  . Omega-3 Fatty Acids (OMEGA 3 PO) Take 3,000 mg by mouth 2 (two) times daily.  Marland Kitchen omeprazole (PRILOSEC) 10 MG capsule Take 10 mg by mouth daily. Reported on 12/10/2015  . Selenium 200 MCG CAPS Take by mouth daily.  . sucralfate (CARAFATE) 1 g tablet Take 1 tablet (1 g total) by mouth 4 (four) times daily -  with meals and at bedtime. Before meals and at bedtime  . traZODone (DESYREL) 150 MG tablet Take 1 tablet (150 mg total) by mouth at bedtime.  . vitamin E 400 UNIT capsule Take 400 Units by mouth daily.    Review of Systems  Constitutional: Negative.   HENT: Negative.  Respiratory: Negative.   Cardiovascular: Negative.     Social History  Substance Use Topics  . Smoking status: Never Smoker  . Smokeless tobacco: Never Used  . Alcohol use 0.0 oz/week     Comment: occasionally   Objective:   BP 110/82 (BP Location: Right Arm)   Pulse 76   Temp 98.6 F (37 C) (Oral)   Resp 16   Ht 5\' 10"  (1.778 m)   Wt 210 lb (95.3 kg)   BMI 30.13 kg/m   Physical Exam  Constitutional: He is oriented to person, place, and time. He appears well-developed and well-nourished. No distress.  HENT:  Head: Normocephalic and atraumatic.  Right Ear: Hearing normal.  Left Ear: Hearing normal.  Nose: Nose normal.  Eyes: Conjunctivae and lids are normal. Right eye exhibits no discharge. Left eye exhibits no discharge. No scleral  icterus.  Pulmonary/Chest: Effort normal. No respiratory distress.  Musculoskeletal: Normal range of motion.  Neurological: He is alert and oriented to person, place, and time.  Skin: Skin is intact. No lesion and no rash noted.  Psychiatric: He has a normal mood and affect. His speech is normal and behavior is normal. Thought content normal.    Assessment & Plan:     1. Hypercholesteremia Tolerating Lovastatin without side effects. Trying to follow low fat diet but has regained 3 lbs. Will refill medications and recheck labs. Follow up pending reports. - lovastatin (MEVACOR) 40 MG tablet; TAKE ONE TABLET BY MOUTH AT BEDTIME  Dispense: 90 tablet; Refill: 3 - CBC with Differential/Platelet - Lipid panel  2. Gastroesophageal reflux disease, esophagitis presence not specified Well controlled dyspepsia with use of Omeprazole. Concerned about taking it every day. Recommend step down to Zantac 150 mg BID for a month then try one at bedtime. Recheck if symptoms return. May need referral to GI for upper endoscopy. - CBC with Differential/Platelet - Comprehensive metabolic panel       Vernie Murders, PA  [facility]

## 2016-04-14 ENCOUNTER — Telehealth: Payer: Self-pay

## 2016-04-14 LAB — LIPID PANEL
CHOLESTEROL TOTAL: 192 mg/dL (ref 100–199)
Chol/HDL Ratio: 4.5 ratio units (ref 0.0–5.0)
HDL: 43 mg/dL (ref >39)
LDL CALC: 104 mg/dL — AB (ref 0–99)
TRIGLYCERIDES: 225 mg/dL — AB (ref 0–149)
VLDL CHOLESTEROL CAL: 45 mg/dL — AB (ref 5–40)

## 2016-04-14 LAB — CBC WITH DIFFERENTIAL/PLATELET
BASOS ABS: 0 10*3/uL (ref 0.0–0.2)
Basos: 1 %
EOS (ABSOLUTE): 0.1 10*3/uL (ref 0.0–0.4)
Eos: 1 %
HEMOGLOBIN: 15.7 g/dL (ref 12.6–17.7)
Hematocrit: 45.3 % (ref 37.5–51.0)
Immature Grans (Abs): 0 10*3/uL (ref 0.0–0.1)
Immature Granulocytes: 0 %
LYMPHS ABS: 1.8 10*3/uL (ref 0.7–3.1)
Lymphs: 40 %
MCH: 31.3 pg (ref 26.6–33.0)
MCHC: 34.7 g/dL (ref 31.5–35.7)
MCV: 90 fL (ref 79–97)
MONOCYTES: 8 %
Monocytes Absolute: 0.4 10*3/uL (ref 0.1–0.9)
Neutrophils Absolute: 2.2 10*3/uL (ref 1.4–7.0)
Neutrophils: 50 %
PLATELETS: 225 10*3/uL (ref 150–379)
RBC: 5.02 x10E6/uL (ref 4.14–5.80)
RDW: 14.1 % (ref 12.3–15.4)
WBC: 4.5 10*3/uL (ref 3.4–10.8)

## 2016-04-14 LAB — COMPREHENSIVE METABOLIC PANEL
ALBUMIN: 4.5 g/dL (ref 3.5–5.5)
ALK PHOS: 47 IU/L (ref 39–117)
ALT: 31 IU/L (ref 0–44)
AST: 22 IU/L (ref 0–40)
Albumin/Globulin Ratio: 1.6 (ref 1.2–2.2)
BILIRUBIN TOTAL: 0.3 mg/dL (ref 0.0–1.2)
BUN/Creatinine Ratio: 20 (ref 9–20)
BUN: 17 mg/dL (ref 6–24)
CO2: 23 mmol/L (ref 18–29)
CREATININE: 0.85 mg/dL (ref 0.76–1.27)
Calcium: 9.2 mg/dL (ref 8.7–10.2)
Chloride: 106 mmol/L (ref 96–106)
GFR calc Af Amer: 111 mL/min/{1.73_m2} (ref >59)
GFR calc non Af Amer: 96 mL/min/{1.73_m2} (ref >59)
GLUCOSE: 96 mg/dL (ref 65–99)
Globulin, Total: 2.9 g/dL (ref 1.5–4.5)
Potassium: 4.5 mmol/L (ref 3.5–5.2)
Sodium: 147 mmol/L — ABNORMAL HIGH (ref 134–144)
Total Protein: 7.4 g/dL (ref 6.0–8.5)

## 2016-04-14 NOTE — Telephone Encounter (Signed)
-----   Message from [place], [place] sent at 04/14/2016  9:25 AM EDT ----- Blood tests essentially normal except triglycerides elevated but better than February results. Continue present medications and recheck levels in 6 months.

## 2016-04-14 NOTE — Telephone Encounter (Signed)
[email or web address] sd  

## 2016-04-22 NOTE — Telephone Encounter (Signed)
LMTCB  Thanks, [employer]

## 2016-04-22 NOTE — Telephone Encounter (Signed)
Patient's wife Inez Catalina (on consent) advised as directed below.   Thanks, [employer]

## 2016-07-20 ENCOUNTER — Ambulatory Visit (INDEPENDENT_AMBULATORY_CARE_PROVIDER_SITE_OTHER): Payer: Managed Care, Other (non HMO) | Admitting: Family Medicine

## 2016-07-20 ENCOUNTER — Encounter: Payer: Self-pay | Admitting: Family Medicine

## 2016-07-20 VITALS — BP 126/88 | HR 76 | Temp 98.0°F | Resp 16 | Wt 215.0 lb

## 2016-07-20 DIAGNOSIS — K219 Gastro-esophageal reflux disease without esophagitis: Secondary | ICD-10-CM

## 2016-07-20 DIAGNOSIS — E7800 Pure hypercholesterolemia, unspecified: Secondary | ICD-10-CM

## 2016-07-20 NOTE — Progress Notes (Signed)
Patient: Richard Warren Male    DOB: 04-Jan-1956   60 y.o.   MRN: [id number] Visit Date: 07/20/2016  Today's Provider: Vernie Murders, PA   Chief Complaint  Patient presents with  . Hyperlipidemia  . Gastroesophageal Reflux  . Follow-up   Subjective:    HPI   Lipid/Cholesterol, Follow-up:   Last seen for this 3 months ago.  Management since that visit includes continue medication.  Last Lipid Panel:    Component Value Date/Time   CHOL 192 04/13/2016 0925   TRIG 225 (H) 04/13/2016 0925   HDL 43 04/13/2016 0925   CHOLHDL 4.5 04/13/2016 0925   LDLCALC 104 (H) 04/13/2016 0925    He reports excellent compliance with treatment. He is not having side effects.   Wt Readings from Last 3 Encounters:  07/20/16 215 lb (97.5 kg)  04/13/16 210 lb (95.3 kg)  12/10/15 207 lb 6.4 oz (94.1 kg)    ------------------------------------------------------------------------  GERD, Follow up:  The patient was last seen for GERD 3 months ago. Changes made since that visit include step down to Zantac 150 mg BID for 1 month then try 1 tablet @ bedtime. Patient states regimen did not work so he started Nexium which is working.   He reports excellent compliance with treatment. He is not having side effects. Marland Kitchen  He IS experiencing heartburn. ------------------------------------------------------------------------  Past Medical History:  Diagnosis Date  . Colon polyp   . GERD (gastroesophageal reflux disease)   . Hyperlipidemia   . Kidney stone    Past Surgical History:  Procedure Laterality Date  . APPENDECTOMY  1968?  . COLONOSCOPY  06-28-09   Dr Bary Castilla  . UPPER GI ENDOSCOPY  06-28-09   Dr Bary Castilla  . VASECTOMY     Family History  Problem Relation Age of Onset  . Dementia Mother   . Heart attack Sister   . Hypertension Brother   . Coronary artery disease Sister    Allergies  Allergen Reactions  . Zolpidem     Depression     Previous Medications   ASPIRIN 81 MG  EC TABLET    Take 1 tablet by mouth daily.   B COMPLEX VITAMINS (VITAMIN-B COMPLEX PO)    Take by mouth.   BROMPHENIRAMINE-PSEUDOEPHEDRINE-DM 30-2-10 MG/5ML SYRUP    Take 5 mLs by mouth 4 (four) times daily as needed.   CHOLECALCIFEROL (VITAMIN D-3 PO)    Take by mouth.   CINNAMON PO    Take by mouth.   COCONUT OIL PO    Take by mouth.   COENZYME Q10 (CO Q 10 PO)    Take by mouth.   CYANOCOBALAMIN (VITAMIN B12 PO)    Take by mouth daily.   ESOMEPRAZOLE MAGNESIUM (NEXIUM PO)    Take by mouth.   GARLIC PO    Take by mouth.   LACTOBACILLUS (ACIDOPHILUS PO)    Take by mouth.   LOVASTATIN (MEVACOR) 40 MG TABLET    TAKE ONE TABLET BY MOUTH AT BEDTIME   MILK THISTLE 250 MG CAPS    Take by mouth.   MULTIPLE VITAMINS-MINERALS (MEGA BASIC PO)    Take by mouth.   OMEGA-3 FATTY ACIDS (OMEGA 3 PO)    Take 3,000 mg by mouth 2 (two) times daily.   SELENIUM 200 MCG CAPS    Take by mouth daily.   TRAZODONE (DESYREL) 150 MG TABLET    Take 1 tablet (150 mg total) by mouth at bedtime.   VITAMIN E 400 UNIT  CAPSULE    Take 400 Units by mouth daily.    Review of Systems  Constitutional: Negative.   Respiratory: Negative.   Gastrointestinal: Negative.   Musculoskeletal: Negative.     Social History  Substance Use Topics  . Smoking status: Never Smoker  . Smokeless tobacco: Never Used  . Alcohol use 0.0 oz/week     Comment: occasionally   Objective:   BP 126/88 (BP Location: Right Arm, Patient Position: Sitting, Cuff Size: Large)   Pulse 76   Temp 98 F (36.7 C) (Oral)   Resp 16   Wt 215 lb (97.5 kg)   BMI 30.85 kg/m   Physical Exam  Constitutional: He is oriented to person, place, and time. He appears well-developed and well-nourished. No distress.  HENT:  Head: Normocephalic and atraumatic.  Right Ear: Hearing normal.  Left Ear: Hearing normal.  Nose: Nose normal.  Eyes: Conjunctivae and lids are normal. Right eye exhibits no discharge. Left eye exhibits no discharge. No scleral icterus.    Neck: Neck supple.  Cardiovascular: Normal rate and regular rhythm.   Pulmonary/Chest: Effort normal and breath sounds normal. No respiratory distress.  Abdominal: Soft. Bowel sounds are normal.  Musculoskeletal: Normal range of motion.  Neurological: He is alert and oriented to person, place, and time.  Skin: Skin is intact. No lesion and no rash noted.  Psychiatric: He has a normal mood and affect. His speech is normal and behavior is normal. Thought content normal.      Assessment & Plan:     1. Hypercholesteremia Tolerating Lovastatin 40 mg qd and trying to follow low fat diet. Needs exercise and weight loss. Will recheck labs and follow up pending reports. - Comprehensive metabolic panel - Lipid panel  2. Gastroesophageal reflux disease, esophagitis presence not specified Used only one Zantac 150 mg qd instead of BID when he stopped the Omeprazole. No GI bleed. Return of dyspepsia and went on to Nexium-24HR 20 mg qd with good relief. Wants to try the Zantac 150 mg BID and only use Nexium if needed. Recheck CBC and follow up with Dr. Bary Castilla for screening colonoscopy and may need repeat of upper endoscopy to reassess LA Grade A esophagitis for progression. Recheck pending lab reports. - CBC with Differential/Platelet

## 2016-07-21 ENCOUNTER — Telehealth: Payer: Self-pay

## 2016-07-21 LAB — COMPREHENSIVE METABOLIC PANEL
A/G RATIO: 1.9 (ref 1.2–2.2)
ALBUMIN: 4.4 g/dL (ref 3.5–5.5)
ALK PHOS: 47 IU/L (ref 39–117)
ALT: 31 IU/L (ref 0–44)
AST: 19 IU/L (ref 0–40)
BILIRUBIN TOTAL: 0.2 mg/dL (ref 0.0–1.2)
BUN / CREAT RATIO: 16 (ref 9–20)
BUN: 14 mg/dL (ref 6–24)
CO2: 26 mmol/L (ref 18–29)
Calcium: 8.8 mg/dL (ref 8.7–10.2)
Chloride: 107 mmol/L — ABNORMAL HIGH (ref 96–106)
Creatinine, Ser: 0.9 mg/dL (ref 0.76–1.27)
GFR calc Af Amer: 108 mL/min/{1.73_m2} (ref >59)
GFR calc non Af Amer: 94 mL/min/{1.73_m2} (ref >59)
GLOBULIN, TOTAL: 2.3 g/dL (ref 1.5–4.5)
Glucose: 98 mg/dL (ref 65–99)
POTASSIUM: 4.1 mmol/L (ref 3.5–5.2)
SODIUM: 146 mmol/L — AB (ref 134–144)
Total Protein: 6.7 g/dL (ref 6.0–8.5)

## 2016-07-21 LAB — CBC WITH DIFFERENTIAL/PLATELET
BASOS: 1 %
Basophils Absolute: 0 10*3/uL (ref 0.0–0.2)
EOS (ABSOLUTE): 0.1 10*3/uL (ref 0.0–0.4)
EOS: 2 %
HEMATOCRIT: 43.3 % (ref 37.5–51.0)
HEMOGLOBIN: 14.9 g/dL (ref 12.6–17.7)
Immature Grans (Abs): 0 10*3/uL (ref 0.0–0.1)
Immature Granulocytes: 0 %
LYMPHS ABS: 1.8 10*3/uL (ref 0.7–3.1)
Lymphs: 44 %
MCH: 31.6 pg (ref 26.6–33.0)
MCHC: 34.4 g/dL (ref 31.5–35.7)
MCV: 92 fL (ref 79–97)
MONOCYTES: 11 %
MONOS ABS: 0.5 10*3/uL (ref 0.1–0.9)
NEUTROS ABS: 1.7 10*3/uL (ref 1.4–7.0)
Neutrophils: 42 %
Platelets: 218 10*3/uL (ref 150–379)
RBC: 4.71 x10E6/uL (ref 4.14–5.80)
RDW: 13.5 % (ref 12.3–15.4)
WBC: 4.1 10*3/uL (ref 3.4–10.8)

## 2016-07-21 LAB — LIPID PANEL
CHOLESTEROL TOTAL: 180 mg/dL (ref 100–199)
Chol/HDL Ratio: 4.6 ratio units (ref 0.0–5.0)
HDL: 39 mg/dL — AB (ref >39)
LDL CALC: 104 mg/dL — AB (ref 0–99)
Triglycerides: 187 mg/dL — ABNORMAL HIGH (ref 0–149)
VLDL CHOLESTEROL CAL: 37 mg/dL (ref 5–40)

## 2016-07-21 NOTE — Telephone Encounter (Signed)
-----   Message from Margo [place], [place] sent at 07/21/2016 10:27 AM EST ----- All blood tests normal except salt level elevated (restrict intake). Cholesterol and triglycerides are improved. Continue Lovastatin and low fat diet. Work on weight loss and if use of Zantac 150 mg BID regularly no help, may switch to the Nexium. Recheck progress of reflux esophagitis treatment in 3 months.

## 2016-07-21 NOTE — Telephone Encounter (Signed)
LMTCB

## 2016-07-22 NOTE — Telephone Encounter (Signed)
Pt's wife returned call and would like a call back. Wife is on [place]

## 2016-07-22 NOTE — Telephone Encounter (Signed)
[email or web address]-[email or web address] 

## 2016-07-23 NOTE — Telephone Encounter (Signed)
Patient advised. Patient scheduled for 3 month follow up appointment.

## 2016-10-26 ENCOUNTER — Ambulatory Visit (INDEPENDENT_AMBULATORY_CARE_PROVIDER_SITE_OTHER): Payer: Managed Care, Other (non HMO) | Admitting: Family Medicine

## 2016-10-26 ENCOUNTER — Encounter: Payer: Self-pay | Admitting: Family Medicine

## 2016-10-26 VITALS — BP 132/96 | HR 81 | Temp 97.8°F | Resp 16 | Wt 212.4 lb

## 2016-10-26 DIAGNOSIS — K219 Gastro-esophageal reflux disease without esophagitis: Secondary | ICD-10-CM

## 2016-10-26 DIAGNOSIS — E870 Hyperosmolality and hypernatremia: Secondary | ICD-10-CM

## 2016-10-26 DIAGNOSIS — J069 Acute upper respiratory infection, unspecified: Secondary | ICD-10-CM

## 2016-10-26 DIAGNOSIS — E7800 Pure hypercholesterolemia, unspecified: Secondary | ICD-10-CM | POA: Diagnosis not present

## 2016-10-26 DIAGNOSIS — B9789 Other viral agents as the cause of diseases classified elsewhere: Secondary | ICD-10-CM

## 2016-10-26 NOTE — Progress Notes (Signed)
Patient: Richard Warren Male    DOB: [birth year]   61 y.o.   MRN: [id number] Visit Date: 10/26/2016  Today's Provider: Vernie Murders, PA   Chief Complaint  Patient presents with  . Hyperlipidemia  . Gastroesophageal Reflux  . Follow-up   Subjective:    HPI  Lipid/Cholesterol, Follow-up:   Last seen for this 3 months ago.  Management since that visit includes continue medications. Restrict salt intake. Sodium levels elevated on 07/20/2016.  Last Lipid Panel:    Component Value Date/Time   CHOL 180 07/20/2016 0926   TRIG 187 (H) 07/20/2016 0926   HDL 39 (L) 07/20/2016 0926   CHOLHDL 4.6 07/20/2016 0926   LDLCALC 104 (H) 07/20/2016 0926    He reports good compliance with treatment. He is not having side effects.   Wt Readings from Last 3 Encounters:  10/26/16 212 lb 6.4 oz (96.3 kg)  07/20/16 215 lb (97.5 kg)  04/13/16 210 lb (95.3 kg)    ------------------------------------------------------------------------  GERD, Follow up:  The patient was last seen for GERD 3 months ago. Changes made since that visit include switched to Zantac 150 mg BID and use Nexium if needed.  He reports fair compliance with treatment. He is not having side effects. Marland Kitchen   ------------------------------------------------------------------------  Patient Active Problem List   Diagnosis Date Noted  . Insomnia, persistent 04/02/2015  . Dizziness 04/02/2015  . Dysphagia, oral phase 04/02/2015  . Elevated blood-pressure reading without diagnosis of hypertension 04/02/2015  . Hypercholesteremia 04/02/2015  . Calculus of kidney 04/02/2015  . Esophageal reflux 07/20/2014  . History of colonic polyps 07/20/2014  . 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 Cannot sleep 11/11/2009  . Herpes zoster without complication 99991111  . Family history of cardiovascular disease 03/29/2007   Past Surgical History:  Procedure Laterality Date  . APPENDECTOMY  1968?  .  COLONOSCOPY  06-28-09   Dr Bary Castilla  . UPPER GI ENDOSCOPY  06-28-09   Dr Bary Castilla  . VASECTOMY     Family History  Problem Relation Age of Onset  . Dementia Mother   . Heart attack Sister   . Hypertension Brother   . Coronary artery disease Sister    Allergies  Allergen Reactions  . Zolpidem     Depression     Previous Medications   ASPIRIN 81 MG EC TABLET    Take 1 tablet by mouth daily.   B COMPLEX VITAMINS (VITAMIN-B COMPLEX PO)    Take by mouth.   BROMPHENIRAMINE-PSEUDOEPHEDRINE-DM 30-2-10 MG/5ML SYRUP    Take 5 mLs by mouth 4 (four) times daily as needed.   CHOLECALCIFEROL (VITAMIN D-3 PO)    Take by mouth.   CINNAMON PO    Take by mouth.   COCONUT OIL PO    Take by mouth.   COENZYME Q10 (CO Q 10 PO)    Take by mouth.   CYANOCOBALAMIN (VITAMIN B12 PO)    Take by mouth daily.   ESOMEPRAZOLE MAGNESIUM (NEXIUM PO)    Take by mouth.   GARLIC PO    Take by mouth.   LACTOBACILLUS (ACIDOPHILUS PO)    Take by mouth.   LOVASTATIN (MEVACOR) 40 MG TABLET    TAKE ONE TABLET BY MOUTH AT BEDTIME   MILK THISTLE 250 MG CAPS    Take by mouth.   MULTIPLE VITAMINS-MINERALS (MEGA BASIC PO)    Take by mouth.   OMEGA-3 FATTY ACIDS (OMEGA 3 PO)    Take 3,000 mg by  mouth 2 (two) times daily.   SELENIUM 200 MCG CAPS    Take by mouth daily.   TRAZODONE (DESYREL) 150 MG TABLET    Take 1 tablet (150 mg total) by mouth at bedtime.   VITAMIN E 400 UNIT CAPSULE    Take 400 Units by mouth daily.    Review of Systems  Constitutional: Negative.   HENT: Positive for rhinorrhea.   Respiratory: Positive for cough.   Cardiovascular: Negative.     Social History  Substance Use Topics  . Smoking status: Never Smoker  . Smokeless tobacco: Never Used  . Alcohol use 0.0 oz/week     Comment: occasionally   Objective:   BP (!) 132/96 (BP Location: Right Arm, Patient Position: Sitting, Cuff Size: Normal)   Pulse 81   Temp 97.8 F (36.6 C) (Oral)   Resp 16   Wt 212 lb 6.4 oz (96.3 kg)   SpO2 96%    BMI 30.48 kg/m   Physical Exam  Constitutional: He is oriented to person, place, and time. He appears well-developed and well-nourished. No distress.  HENT:  Head: Normocephalic and atraumatic.  Right Ear: Hearing and external ear normal.  Left Ear: Hearing and external ear normal.  Nose: Nose normal.  Mouth/Throat: Oropharynx is clear and moist.  Eyes: Conjunctivae and lids are normal. Right eye exhibits no discharge. Left eye exhibits no discharge. No scleral icterus.  Neck: Neck supple.  Cardiovascular: Normal rate and regular rhythm.   Pulmonary/Chest: Effort normal and breath sounds normal. No respiratory distress.  Abdominal: Soft. Bowel sounds are normal.  Musculoskeletal: Normal range of motion.  Lymphadenopathy:    He has no cervical adenopathy.  Neurological: He is alert and oriented to person, place, and time.  Skin: Skin is intact. No lesion and no rash noted.  Psychiatric: He has a normal mood and affect. His speech is normal and behavior is normal. Thought content normal.      Assessment & Plan:     1. Hypercholesteremia Tolerating Lovastatin 40 mg qd and Omega-3 Fish Oil 3000 mg 2 qd without side effects. Will check labs to assess progress. Continue low fat diet and recheck pending reports. - Comprehensive metabolic panel - Lipid panel  2. Gastroesophageal reflux disease, esophagitis presence not specified Zantac helping control dyspepsia but can't stop it without symptoms returning. No melena or hematemesis.  - CBC with Differential/Platelet  3. Viral upper respiratory tract infection Has some rhinorrhea, scratchy throat and cough yesterday. No symptoms today. Did not have any fever. Will check CBC with differential. May use Mucinex-DM prn and increase fluid intake. - CBC with Differential/Platelet  4. Serum sodium elevated Has sodium up to 146 in November 2017 with chloride 107. Encouraged to drink extra water and will recheck CMP. - Comprehensive metabolic  panel

## 2016-10-27 LAB — CBC WITH DIFFERENTIAL/PLATELET
BASOS ABS: 0 10*3/uL (ref 0.0–0.2)
Basos: 1 %
EOS (ABSOLUTE): 0.1 10*3/uL (ref 0.0–0.4)
Eos: 2 %
Hematocrit: 45.7 % (ref 37.5–51.0)
Hemoglobin: 15.4 g/dL (ref 13.0–17.7)
IMMATURE GRANS (ABS): 0 10*3/uL (ref 0.0–0.1)
IMMATURE GRANULOCYTES: 0 %
LYMPHS: 44 %
Lymphocytes Absolute: 1.6 10*3/uL (ref 0.7–3.1)
MCH: 31.3 pg (ref 26.6–33.0)
MCHC: 33.7 g/dL (ref 31.5–35.7)
MCV: 93 fL (ref 79–97)
Monocytes Absolute: 0.4 10*3/uL (ref 0.1–0.9)
Monocytes: 11 %
NEUTROS ABS: 1.5 10*3/uL (ref 1.4–7.0)
NEUTROS PCT: 42 %
PLATELETS: 211 10*3/uL (ref 150–379)
RBC: 4.92 x10E6/uL (ref 4.14–5.80)
RDW: 13.9 % (ref 12.3–15.4)
WBC: 3.6 10*3/uL (ref 3.4–10.8)

## 2016-10-27 LAB — COMPREHENSIVE METABOLIC PANEL
ALT: 30 IU/L (ref 0–44)
AST: 23 IU/L (ref 0–40)
Albumin/Globulin Ratio: 1.5 (ref 1.2–2.2)
Albumin: 4.3 g/dL (ref 3.5–5.5)
Alkaline Phosphatase: 45 IU/L (ref 39–117)
BILIRUBIN TOTAL: 0.3 mg/dL (ref 0.0–1.2)
BUN/Creatinine Ratio: 13 (ref 9–20)
BUN: 12 mg/dL (ref 6–24)
CHLORIDE: 103 mmol/L (ref 96–106)
CO2: 23 mmol/L (ref 18–29)
Calcium: 9 mg/dL (ref 8.7–10.2)
Creatinine, Ser: 0.96 mg/dL (ref 0.76–1.27)
GFR calc non Af Amer: 86 mL/min/{1.73_m2} (ref >59)
GFR, EST AFRICAN AMERICAN: 100 mL/min/{1.73_m2} (ref >59)
GLUCOSE: 97 mg/dL (ref 65–99)
Globulin, Total: 2.8 g/dL (ref 1.5–4.5)
Potassium: 4.8 mmol/L (ref 3.5–5.2)
Sodium: 144 mmol/L (ref 134–144)
TOTAL PROTEIN: 7.1 g/dL (ref 6.0–8.5)

## 2016-10-27 LAB — LIPID PANEL
CHOLESTEROL TOTAL: 165 mg/dL (ref 100–199)
Chol/HDL Ratio: 4.7 ratio units (ref 0.0–5.0)
HDL: 35 mg/dL — ABNORMAL LOW (ref >39)
LDL Calculated: 87 mg/dL (ref 0–99)
Triglycerides: 215 mg/dL — ABNORMAL HIGH (ref 0–149)
VLDL CHOLESTEROL CAL: 43 mg/dL — AB (ref 5–40)

## 2016-10-28 NOTE — Progress Notes (Signed)
Advised  ED 

## 2016-12-30 ENCOUNTER — Emergency Department: Payer: Managed Care, Other (non HMO)

## 2016-12-30 ENCOUNTER — Emergency Department
Admission: EM | Admit: 2016-12-30 | Discharge: 2016-12-30 | Disposition: A | Discharge status: Home / Self Care | Payer: Managed Care, Other (non HMO) | Attending: Student in an Organized Health Care Education/Training Program | Admitting: Student in an Organized Health Care Education/Training Program

## 2016-12-30 ENCOUNTER — Encounter: Payer: Self-pay | Admitting: Emergency Medicine

## 2016-12-30 DIAGNOSIS — Z79899 Other long term (current) drug therapy: Secondary | ICD-10-CM | POA: Insufficient documentation

## 2016-12-30 DIAGNOSIS — Q6211 Congenital occlusion of ureteropelvic junction: Secondary | ICD-10-CM

## 2016-12-30 DIAGNOSIS — R10A2 Flank pain, left side: Secondary | ICD-10-CM

## 2016-12-30 DIAGNOSIS — Z7982 Long term (current) use of aspirin: Secondary | ICD-10-CM | POA: Insufficient documentation

## 2016-12-30 DIAGNOSIS — N201 Calculus of ureter: Secondary | ICD-10-CM

## 2016-12-30 DIAGNOSIS — N130 Hydronephrosis with ureteropelvic junction obstruction: Secondary | ICD-10-CM | POA: Insufficient documentation

## 2016-12-30 DIAGNOSIS — R109 Unspecified abdominal pain: Secondary | ICD-10-CM | POA: Diagnosis present

## 2016-12-30 LAB — CBC
HEMATOCRIT: 44.7 % (ref 40.0–52.0)
HEMOGLOBIN: 15.2 g/dL (ref 13.0–18.0)
MCH: 32.1 pg (ref 26.0–34.0)
MCHC: 34.1 g/dL (ref 32.0–36.0)
MCV: 94.1 fL (ref 80.0–100.0)
Platelets: 203 10*3/uL (ref 150–440)
RBC: 4.75 MIL/uL (ref 4.40–5.90)
RDW: 14.1 % (ref 11.5–14.5)
WBC: 5.9 10*3/uL (ref 3.8–10.6)

## 2016-12-30 LAB — URINALYSIS, COMPLETE (UACMP) WITH MICROSCOPIC
BACTERIA UA: NONE SEEN
Bilirubin Urine: NEGATIVE
GLUCOSE, UA: NEGATIVE mg/dL
Ketones, ur: NEGATIVE mg/dL
LEUKOCYTES UA: NEGATIVE
Nitrite: NEGATIVE
Protein, ur: 30 mg/dL — AB
SQUAMOUS EPITHELIAL / LPF: NONE SEEN
Specific Gravity, Urine: 1.019 (ref 1.005–1.030)
WBC UA: NONE SEEN WBC/hpf (ref 0–5)
pH: 6 (ref 5.0–8.0)

## 2016-12-30 LAB — COMPREHENSIVE METABOLIC PANEL
ALT: 25 U/L (ref 17–63)
AST: 27 U/L (ref 15–41)
Albumin: 4 g/dL (ref 3.5–5.0)
Alkaline Phosphatase: 37 U/L — ABNORMAL LOW (ref 38–126)
Anion gap: 10 (ref 5–15)
BUN: 16 mg/dL (ref 6–20)
CHLORIDE: 107 mmol/L (ref 101–111)
CO2: 20 mmol/L — ABNORMAL LOW (ref 22–32)
Calcium: 9 mg/dL (ref 8.9–10.3)
Creatinine, Ser: 0.94 mg/dL (ref 0.61–1.24)
GFR calc Af Amer: >60 mL/min (ref >60)
Glucose, Bld: 116 mg/dL — ABNORMAL HIGH (ref 65–99)
POTASSIUM: 4 mmol/L (ref 3.5–5.1)
Sodium: 137 mmol/L (ref 135–145)
Total Bilirubin: 0.9 mg/dL (ref 0.3–1.2)
Total Protein: 7.2 g/dL (ref 6.5–8.1)

## 2016-12-30 LAB — LIPASE, BLOOD: LIPASE: 28 U/L (ref 11–51)

## 2016-12-30 MED ORDER — ONDANSETRON HCL 4 MG PO TABS: 4.0000 mg | ORAL_TABLET | Freq: Every day | ORAL | 0 refills | Status: DC | PRN

## 2016-12-30 MED ORDER — HYDROCODONE-ACETAMINOPHEN 5-325 MG PO TABS
1.0000 | ORAL_TABLET | Freq: Once | ORAL | Status: AC
  Administered 2016-12-30: 1 via ORAL
  Filled 2016-12-30: qty 1

## 2016-12-30 MED ORDER — MORPHINE SULFATE (PF) 4 MG/ML IV SOLN: 4.0000 mg | Freq: Once | INTRAVENOUS | Status: DC

## 2016-12-30 MED ORDER — NAPROXEN 500 MG PO TABS: 500.0000 mg | ORAL_TABLET | Freq: Two times a day (BID) | ORAL | 0 refills | Status: DC

## 2016-12-30 MED ORDER — KETOROLAC TROMETHAMINE 30 MG/ML IJ SOLN
15.0000 mg | Freq: Once | INTRAMUSCULAR | Status: AC
  Administered 2016-12-30: 15 mg via INTRAVENOUS
  Filled 2016-12-30: qty 1

## 2016-12-30 MED ORDER — ONDANSETRON HCL 4 MG/2ML IJ SOLN
4.0000 mg | Freq: Once | INTRAMUSCULAR | Status: AC
  Administered 2016-12-30: 4 mg via INTRAVENOUS

## 2016-12-30 MED ORDER — MORPHINE SULFATE (PF) 4 MG/ML IV SOLN
INTRAVENOUS | Status: AC
  Administered 2016-12-30: 4 mg via INTRAVENOUS
  Filled 2016-12-30: qty 1

## 2016-12-30 MED ORDER — TAMSULOSIN HCL 0.4 MG PO CAPS: 0.4000 mg | ORAL_CAPSULE | Freq: Every day | ORAL | 0 refills | Status: DC

## 2016-12-30 MED ORDER — HYDROCODONE-ACETAMINOPHEN 5-325 MG PO TABS: 1.0000 | ORAL_TABLET | ORAL | 0 refills | Status: DC | PRN

## 2016-12-30 MED ORDER — KETOROLAC TROMETHAMINE 30 MG/ML IJ SOLN
INTRAMUSCULAR | Status: AC
  Administered 2016-12-30: 15 mg via INTRAVENOUS
  Filled 2016-12-30: qty 1

## 2016-12-30 MED ORDER — TAMSULOSIN HCL 0.4 MG PO CAPS
ORAL_CAPSULE | ORAL | Status: AC
  Filled 2016-12-30: qty 1

## 2016-12-30 MED ORDER — KETOROLAC TROMETHAMINE 30 MG/ML IJ SOLN
15.0000 mg | Freq: Once | INTRAMUSCULAR | Status: AC
  Administered 2016-12-30: 15 mg via INTRAVENOUS

## 2016-12-30 MED ORDER — MORPHINE SULFATE (PF) 4 MG/ML IV SOLN
4.0000 mg | INTRAVENOUS | Status: DC | PRN
  Administered 2016-12-30 (×2): 4 mg via INTRAVENOUS
  Filled 2016-12-30: qty 1

## 2016-12-30 MED ORDER — TAMSULOSIN HCL 0.4 MG PO CAPS
0.4000 mg | ORAL_CAPSULE | Freq: Every day | ORAL | Status: DC
  Administered 2016-12-30: 0.4 mg via ORAL

## 2016-12-30 MED ORDER — ONDANSETRON HCL 4 MG/2ML IJ SOLN
INTRAMUSCULAR | Status: AC
  Administered 2016-12-30: 4 mg via INTRAVENOUS
  Filled 2016-12-30: qty 2

## 2016-12-30 NOTE — ED Notes (Signed)
Patient discharged to home per MD order. Patient in stable condition, and deemed medically cleared by ED provider for discharge. Discharge instructions reviewed with patient/family using "Teach Back"; verbalized understanding of medication education and administration, and information about follow-up care. Denies further concerns. ° °

## 2016-12-30 NOTE — ED Triage Notes (Signed)
Pt presents with left flank pain since Monday. He states that it stopped on Tuesday and came back today. Pt denies urinary symptoms, but has hx of kidney stones. Pt alert & oriented with NAD noted.

## 2016-12-30 NOTE — ED Notes (Signed)
Pt states pain had gone down to 2/10 after now back up to 10/10 to left flank.

## 2016-12-30 NOTE — ED Provider Notes (Signed)
[facility] Emergency Department Provider Note    First MD Initiated Contact with Patient 12/30/16 1905     (approximate)  I have reviewed the triage vital signs and the nursing notes.   HISTORY  Chief Complaint Flank Pain    HPI Richard Warren is a 61 y.o. male presents with acute 10 out of 10 left flank pain. Patient states he did have colicky left-sided flank pain on Monday but it resolved. States that it came back early this evening and was very severe associated with nausea but no vomiting. Denies any hematuria or dysuria. No fevers. Points to his left flank when asking where it hurts. Patient is ambulated about the room and unable to stand still. States he's never had pain like this before.4   Past Medical History:  Diagnosis Date  . Colon polyp   . GERD (gastroesophageal reflux disease)   . Hyperlipidemia   . Kidney stone    Family History  Problem Relation Age of Onset  . Dementia Mother   . Heart attack Sister   . Hypertension Brother   . Coronary artery disease Sister    Past Surgical History:  Procedure Laterality Date  . APPENDECTOMY  1968?  . COLONOSCOPY  06-28-09   Dr Bary Castilla  . UPPER GI ENDOSCOPY  06-28-09   Dr Bary Castilla  . VASECTOMY     Patient Active Problem List   Diagnosis Date Noted  . Insomnia, persistent 04/02/2015  . Dizziness 04/02/2015  . Dysphagia, oral phase 04/02/2015  . Elevated blood-pressure reading without diagnosis of hypertension 04/02/2015  . Hypercholesteremia 04/02/2015  . Calculus of kidney 04/02/2015  . Esophageal reflux 07/20/2014  . History of colonic polyps 07/20/2014  . 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 Cannot sleep 11/11/2009  . Herpes zoster without complication 23/76/2831  . Family history of cardiovascular disease 03/29/2007      Prior to Admission medications   Medication Sig Start Date End Date Taking? Authorizing Provider  Aspirin 81 MG EC tablet  Take 1 tablet by mouth daily. 09/16/09   Historical Provider, MD  B Complex Vitamins (VITAMIN-B COMPLEX PO) Take by mouth.    Historical Provider, MD  brompheniramine-pseudoephedrine-DM 30-2-10 MG/5ML syrup Take 5 mLs by mouth 4 (four) times daily as needed. 12/12/15   Vickki Muff Chrismon, PA  Cholecalciferol (VITAMIN D-3 PO) Take by mouth.    Historical Provider, MD  CINNAMON PO Take by mouth.    Historical Provider, MD  COCONUT OIL PO Take by mouth.    Historical Provider, MD  Coenzyme Q10 (CO Q 10 PO) Take by mouth.    Historical Provider, MD  Cyanocobalamin (VITAMIN B12 PO) Take by mouth daily.    Historical Provider, MD  Esomeprazole Magnesium (NEXIUM PO) Take by mouth.    Historical Provider, MD  GARLIC PO Take by mouth.    Historical Provider, MD  HYDROcodone-acetaminophen (NORCO) 5-325 MG tablet Take 1 tablet by mouth every 4 (four) hours as needed for moderate pain. 12/30/16   Merlyn Lot, MD  Lactobacillus (ACIDOPHILUS PO) Take by mouth.    Historical Provider, MD  lovastatin (MEVACOR) 40 MG tablet TAKE ONE TABLET BY MOUTH AT BEDTIME 04/13/16   Vickki Muff Chrismon, PA  Milk Thistle 250 MG CAPS Take by mouth.    Historical Provider, MD  Multiple Vitamins-Minerals (MEGA BASIC PO) Take by mouth.    Historical Provider, MD  naproxen (NAPROSYN) 500 MG tablet Take 1 tablet (500 mg total) by mouth  2 (two) times daily with a meal. 12/30/16 12/30/17  Merlyn Lot, MD  Omega-3 Fatty Acids (OMEGA 3 PO) Take 3,000 mg by mouth 2 (two) times daily.    Historical Provider, MD  ondansetron (ZOFRAN) 4 MG tablet Take 1 tablet (4 mg total) by mouth daily as needed for nausea or vomiting. 12/30/16 12/30/17  Merlyn Lot, MD  Selenium 200 MCG CAPS Take by mouth daily.    Historical Provider, MD  tamsulosin (FLOMAX) 0.4 MG CAPS capsule Take 1 capsule (0.4 mg total) by mouth daily after supper. 12/30/16   Merlyn Lot, MD  traZODone (DESYREL) 150 MG tablet Take 1 tablet (150 mg total) by mouth at  bedtime. Patient not taking: Reported on 10/26/2016 04/02/15   Vickki Muff Chrismon, PA  vitamin E 400 UNIT capsule Take 400 Units by mouth daily.    Historical Provider, MD    Allergies Zolpidem    Social History Social History  Substance Use Topics  . Smoking status: Never Smoker  . Smokeless tobacco: Never Used  . Alcohol use 30.0 oz/week    50 Standard drinks or equivalent per week     Comment: occasionally    Review of Systems Patient denies headaches, rhinorrhea, blurry vision, numbness, shortness of breath, chest pain, edema, cough, abdominal pain, nausea, vomiting, diarrhea, dysuria, fevers, rashes or hallucinations unless otherwise stated above in HPI. ____________________________________________   PHYSICAL EXAM:  VITAL SIGNS: Vitals:   12/30/16 1833 12/30/16 1950  BP: (!) 151/88 (!) 151/88  Pulse: 78 82  Resp: 16 20  Temp: 98 F (36.7 C)     Constitutional: Alert and oriented. Uncomfortable appearing but in NAD Eyes: Conjunctivae are normal. PERRL. EOMI. Head: Atraumatic. Nose: No congestion/rhinnorhea. Mouth/Throat: Mucous membranes are moist.  Oropharynx non-erythematous. Neck: No stridor. Painless ROM. No cervical spine tenderness to palpation Hematological/Lymphatic/Immunilogical: No cervical lymphadenopathy. Cardiovascular: Normal rate, regular rhythm. Grossly normal heart sounds.  Good peripheral circulation. Respiratory: Normal respiratory effort.  No retractions. Lungs CTAB. Gastrointestinal: Soft and nontender. No distention. No abdominal bruits. + left sided CVA ttp. Genitourinary:  Musculoskeletal: No lower extremity tenderness nor edema.  No joint effusions. Neurologic:  Normal speech and language. No gross focal neurologic deficits are appreciated. No gait instability. Skin:  Skin is warm, dry and intact. No rash noted. Psychiatric: Mood and affect are normal. Speech and behavior are normal.  ____________________________________________    LABS (all labs ordered are listed, but only abnormal results are displayed)  Results for orders placed or performed during the hospital encounter of 12/30/16 (from the past 24 hour(s))  Lipase, blood     Status: None   Collection Time: 12/30/16  6:41 PM  Result Value Ref Range   Lipase 28 11 - 51 U/L  Comprehensive metabolic panel     Status: Abnormal   Collection Time: 12/30/16  6:41 PM  Result Value Ref Range   Sodium 137 135 - 145 mmol/L   Potassium 4.0 3.5 - 5.1 mmol/L   Chloride 107 101 - 111 mmol/L   CO2 20 (L) 22 - 32 mmol/L   Glucose, Bld 116 (H) 65 - 99 mg/dL   BUN 16 6 - 20 mg/dL   Creatinine, Ser 0.94 0.61 - 1.24 mg/dL   Calcium 9.0 8.9 - 10.3 mg/dL   Total Protein 7.2 6.5 - 8.1 g/dL   Albumin 4.0 3.5 - 5.0 g/dL   AST 27 15 - 41 U/L   ALT 25 17 - 63 U/L   Alkaline Phosphatase 37 (L) 38 -  126 U/L   Total Bilirubin 0.9 0.3 - 1.2 mg/dL   GFR calc non Af Amer >60 >60 mL/min   GFR calc Af Amer >60 >60 mL/min   Anion gap 10 5 - 15  CBC     Status: None   Collection Time: 12/30/16  6:41 PM  Result Value Ref Range   WBC 5.9 3.8 - 10.6 K/uL   RBC 4.75 4.40 - 5.90 MIL/uL   Hemoglobin 15.2 13.0 - 18.0 g/dL   HCT 44.7 40.0 - 52.0 %   MCV 94.1 80.0 - 100.0 fL   MCH 32.1 26.0 - 34.0 pg   MCHC 34.1 32.0 - 36.0 g/dL   RDW 14.1 11.5 - 14.5 %   Platelets 203 150 - 440 K/uL  Urinalysis, Complete w Microscopic     Status: Abnormal   Collection Time: 12/30/16  6:41 PM  Result Value Ref Range   Color, Urine YELLOW (A) YELLOW   APPearance CLOUDY (A) CLEAR   Specific Gravity, Urine 1.019 1.005 - 1.030   pH 6.0 5.0 - 8.0   Glucose, UA NEGATIVE NEGATIVE mg/dL   Hgb urine dipstick LARGE (A) NEGATIVE   Bilirubin Urine NEGATIVE NEGATIVE   Ketones, ur NEGATIVE NEGATIVE mg/dL   Protein, ur 30 (A) NEGATIVE mg/dL   Nitrite NEGATIVE NEGATIVE   Leukocytes, UA NEGATIVE NEGATIVE   RBC / HPF TOO NUMEROUS TO COUNT 0 - 5 RBC/hpf   WBC, UA NONE SEEN 0 - 5 WBC/hpf   Bacteria, UA NONE SEEN  NONE SEEN   Squamous Epithelial / LPF NONE SEEN NONE SEEN   Mucous PRESENT    ____________________________________________ ____________________________________________  RADIOLOGY  I personally reviewed all radiographic images ordered to evaluate for the above acute complaints and reviewed radiology reports and findings.  These findings were personally discussed with the patient.  Please see medical record for radiology report.  ____________________________________________   PROCEDURES  Procedure(s) performed:  Procedures    Critical Care performed: no ____________________________________________   INITIAL IMPRESSION / ASSESSMENT AND PLAN / ED COURSE  Pertinent labs & imaging results that were available during my care of the patient were reviewed by me and considered in my medical decision making (see chart for details).  DDX: stone, AAA, pyelo, msk strain , shingles   Richard Warren is a 61 y.o. who presents to the ED  with Hx of stones p/w acute left flank pain. No fevers, no systemic symptoms. no urinary symptoms. Denies trauma or injury. Afebrile in ED. Exam as above. Flank TTP, otherwise abdominal exam is benign. No peritoneal signs. Probable kidney stone, cystitis, or pyelonephritis.   Checking urine. UA with Hgb and no evidence of infection CT Stone with obstructing stone at UPJ with hydro which explains his acute pain. Clinical picture is not consistent with appendicitis, diverticulitis, pancreatitis, cholecystitis, bowel perforation, aortic dissection, splenic injury or acute abdominal process at this time.  Pain improving with IV analgesics.  Will dose Toradol and reassess.   Clinical Course as of Dec 30 2305  Wed Dec 30, 2016  2257 Pain resolved.  Patient was able to tolerate PO and was able to ambulate with a steady gait.  As his pain has resolved I do believe the patient is stable DC home with referral to urology.  Have discussed with the patient and  available family all diagnostics and treatments performed thus far and all questions were answered to the best of my ability. The patient demonstrates understanding and agreement with plan.    [  PR]    Clinical Course User Index [PR] Merlyn Lot, MD     ____________________________________________   FINAL CLINICAL IMPRESSION(S) / ED DIAGNOSES  Final diagnoses:  Acute abdominal pain in left flank  Ureterolithiasis  Hydronephrosis with ureteropelvic junction (UPJ) obstruction      NEW MEDICATIONS STARTED DURING THIS VISIT:  Discharge Medication List as of 12/30/2016 10:56 PM    START taking these medications   Details  HYDROcodone-acetaminophen (NORCO) 5-325 MG tablet Take 1 tablet by mouth every 4 (four) hours as needed for moderate pain., Starting Wed 12/30/2016, Print    naproxen (NAPROSYN) 500 MG tablet Take 1 tablet (500 mg total) by mouth 2 (two) times daily with a meal., Starting Wed 12/30/2016, Until Thu 12/30/2017, Print    ondansetron (ZOFRAN) 4 MG tablet Take 1 tablet (4 mg total) by mouth daily as needed for nausea or vomiting., Starting Wed 12/30/2016, Until Thu 12/30/2017, Print    tamsulosin (FLOMAX) 0.4 MG CAPS capsule Take 1 capsule (0.4 mg total) by mouth daily after supper., Starting Wed 12/30/2016, Print         Note:  This document was prepared using Dragon voice recognition software and may include unintentional dictation errors.    Merlyn Lot, MD 12/30/16 [phone]

## 2016-12-30 NOTE — ED Notes (Signed)
Pt in with co left flank pain since Monday hx of kidney stones. Denies any dysuria, no n.v.d. Pain meds given, pain 10/10 prior to administration.

## 2017-01-01 ENCOUNTER — Ambulatory Visit (INDEPENDENT_AMBULATORY_CARE_PROVIDER_SITE_OTHER): Payer: Managed Care, Other (non HMO) | Admitting: Urology

## 2017-01-01 ENCOUNTER — Encounter: Payer: Self-pay | Admitting: Urology

## 2017-01-01 VITALS — BP 128/77 | HR 87 | Ht 69.0 in | Wt 216.4 lb

## 2017-01-01 DIAGNOSIS — N200 Calculus of kidney: Secondary | ICD-10-CM

## 2017-01-01 LAB — URINALYSIS, COMPLETE
Bilirubin, UA: NEGATIVE
Glucose, UA: NEGATIVE
KETONES UA: NEGATIVE
LEUKOCYTES UA: NEGATIVE
Nitrite, UA: NEGATIVE
PH UA: 5 (ref 5.0–7.5)
Urobilinogen, Ur: 0.2 mg/dL (ref 0.2–1.0)

## 2017-01-01 LAB — MICROSCOPIC EXAMINATION

## 2017-01-01 MED ORDER — OXYCODONE-ACETAMINOPHEN 5-325 MG PO TABS: 1.0000 | ORAL_TABLET | ORAL | 0 refills | Status: DC | PRN

## 2017-01-01 NOTE — Progress Notes (Signed)
01/01/2017 11:17 AM   Altamese Dilling 18-May-1956 [id number]  Referring provider: Birdie Sons, MD [address] [place], [place] [address]  Chief Complaint  Patient presents with  . Hydronephrosis    HPI: The patient is a 61 year old gentleman who presents today for evaluation of a left 6 mm proximal ureteral calculus diagnosed in the emergency department.  This was diagnosed in the emergency department a few days ago. His pain is better controlled at this point though he does have intermittent discomfort. He has used that most of his Norco. He is taking Flomax as well. Using Zofran as needed. He has a known stone on the left side that he is not pass prior to this point. He has never had an episode of stone passage before. This is his first time passing a stone. He has no burning with urination this time. He denies fevers or chills.  On review of the CT, the stone appears to be 3 mm in width and 6 mm in length. There are no other stones on this study. It looks geometrically similar and is the same size as a left nonobstructing stone seen on a KUB in 2016 suggesting that this is the same stone.  PMH: Past Medical History:  Diagnosis Date  . Colon polyp   . GERD (gastroesophageal reflux disease)   . Hyperlipidemia   . Kidney stone     Surgical History: Past Surgical History:  Procedure Laterality Date  . APPENDECTOMY  1968?  . COLONOSCOPY  06-28-09   Dr Bary Castilla  . UPPER GI ENDOSCOPY  06-28-09   Dr Bary Castilla  . VASECTOMY      Home Medications:  Allergies as of 01/01/2017      Reactions   Zolpidem    Depression      Medication List       Accurate as of 01/01/17 11:17 AM. Always use your most recent med list.          ACIDOPHILUS PO Take by mouth.   Aspirin 81 MG EC tablet Take 1 tablet by mouth daily.   CINNAMON PO Take by mouth.   CO Q 10 PO Take by mouth.   COCONUT OIL PO Take by mouth.   GARLIC PO Take by mouth.     HYDROcodone-acetaminophen 5-325 MG tablet Commonly known as:  NORCO Take 1 tablet by mouth every 4 (four) hours as needed for moderate pain.   lovastatin 40 MG tablet Commonly known as:  MEVACOR TAKE ONE TABLET BY MOUTH AT BEDTIME   MEGA BASIC PO Take by mouth.   Milk Thistle 250 MG Caps Take by mouth.   naproxen 500 MG tablet Commonly known as:  NAPROSYN Take 1 tablet (500 mg total) by mouth 2 (two) times daily with a meal.   NEXIUM PO Take by mouth.   OMEGA 3 PO Take 3,000 mg by mouth 2 (two) times daily.   ondansetron 4 MG tablet Commonly known as:  ZOFRAN Take 1 tablet (4 mg total) by mouth daily as needed for nausea or vomiting.   Selenium 200 MCG Caps Take by mouth daily.   tamsulosin 0.4 MG Caps capsule Commonly known as:  FLOMAX Take 1 capsule (0.4 mg total) by mouth daily after supper.   traZODone 150 MG tablet Commonly known as:  DESYREL Take 1 tablet (150 mg total) by mouth at bedtime.   VITAMIN B12 PO Take by mouth daily.   VITAMIN D-3 PO Take by mouth.   vitamin  E 400 UNIT capsule Take 400 Units by mouth daily.   VITAMIN-B COMPLEX PO Take by mouth.       Allergies:  Allergies  Allergen Reactions  . Zolpidem     Depression    Family History: Family History  Problem Relation Age of Onset  . Dementia Mother   . Heart attack Sister   . Hypertension Brother   . Coronary artery disease Sister   . Prostate cancer Neg Hx   . Bladder Cancer Neg Hx   . Kidney cancer Neg Hx     Social History:  reports that he has never smoked. He has never used smokeless tobacco. He reports that he drinks about 30.0 oz of alcohol per week . He reports that he does not use drugs.  ROS: UROLOGY Frequent Urination?: No Hard to postpone urination?: No Burning/pain with urination?: No Get up at night to urinate?: Yes Leakage of urine?: No Urine stream starts and stops?: No Trouble starting stream?: Yes Do you have to strain to urinate?: Yes Blood in  urine?: No Urinary tract infection?: No Sexually transmitted disease?: No Injury to kidneys or bladder?: No Painful intercourse?: No Weak stream?: No Erection problems?: No Penile pain?: No  Gastrointestinal Nausea?: No Vomiting?: No Indigestion/heartburn?: No Diarrhea?: No Constipation?: Yes  Constitutional Fever: No Night sweats?: No Weight loss?: No Fatigue?: No  Skin Skin rash/lesions?: No Itching?: No  Eyes Blurred vision?: No Double vision?: No  Ears/Nose/Throat Sore throat?: No Sinus problems?: No  Hematologic/Lymphatic Swollen glands?: No Easy bruising?: No  Cardiovascular Leg swelling?: No Chest pain?: No  Respiratory Cough?: No Shortness of breath?: No  Endocrine Excessive thirst?: No  Musculoskeletal Back pain?: No Joint pain?: No  Neurological Headaches?: No Dizziness?: No  Psychologic Depression?: No Anxiety?: No  Physical Exam: BP 128/77 (BP Location: Left Arm, Patient Position: Sitting, Cuff Size: Normal)   Pulse 87   Ht 5\' 9"  (1.753 m)   Wt 216 lb 6.4 oz (98.2 kg)   BMI 31.96 kg/m   Constitutional:  Alert and oriented, No acute distress. HEENT: [place] AT, moist mucus membranes.  Trachea midline, no masses. Cardiovascular: No clubbing, cyanosis, or edema. Respiratory: Normal respiratory effort, no increased work of breathing. GI: Abdomen is soft, nontender, nondistended, no abdominal masses GU: Left CVA tenderness.  Skin: No rashes, bruises or suspicious lesions. Lymph: No cervical or inguinal adenopathy. Neurologic: Grossly intact, no focal deficits, moving all 4 extremities. Psychiatric: Normal mood and affect.  Laboratory Data: Lab Results  Component Value Date   WBC 5.9 12/30/2016   HGB 15.2 12/30/2016   HCT 44.7 12/30/2016   MCV 94.1 12/30/2016   PLT 203 12/30/2016    Lab Results  Component Value Date   CREATININE 0.94 12/30/2016    No results found for: PSA  No results found for: TESTOSTERONE  No  results found for: HGBA1C  Urinalysis    Component Value Date/Time   COLORURINE YELLOW (A) 12/30/2016 1841   APPEARANCEUR CLOUDY (A) 12/30/2016 1841   LABSPEC 1.019 12/30/2016 1841   PHURINE 6.0 12/30/2016 1841   GLUCOSEU NEGATIVE 12/30/2016 1841   HGBUR LARGE (A) 12/30/2016 1841   BILIRUBINUR NEGATIVE 12/30/2016 1841   BILIRUBINUR neg 04/02/2015 0817   KETONESUR NEGATIVE 12/30/2016 1841   PROTEINUR 30 (A) 12/30/2016 1841   UROBILINOGEN 0.2 04/02/2015 0817   NITRITE NEGATIVE 12/30/2016 1841   LEUKOCYTESUR NEGATIVE 12/30/2016 1841    Pertinent Imaging: CT and KUB reviewed as above  Assessment & Plan:   1.  Left ureteral calculus I discussed with the patient options for for his left ureteral calculus. We discussed medical expulsive therapy, lithotripsy, and ureteroscopy. After discussing all options in detail, the patient elected to undergo lithotripsy. This stone has been seen on a KUB in the past when he was a nonobstructing stone, so it is amenable to this therapy. We discussed the risks of the surgery which include but are not limited to incomplete stone passage, need for repeat procedures, infection, postoperative pain, and bleeding. All questions were answered. The patient is elected to proceed with lithotripsy next week. I have given him some Percocet to help in the interim. He will also continue his Flomax for now.    Nickie Retort, MD  [facility] [address] [place], [place] [address] [phone]

## 2017-01-03 LAB — URINE CULTURE: ORGANISM ID, BACTERIA: NO GROWTH

## 2017-01-04 ENCOUNTER — Other Ambulatory Visit: Payer: Self-pay | Admitting: Radiology

## 2017-01-04 DIAGNOSIS — N201 Calculus of ureter: Secondary | ICD-10-CM

## 2017-01-07 ENCOUNTER — Ambulatory Visit: Payer: Managed Care, Other (non HMO)

## 2017-01-07 ENCOUNTER — Encounter
Admission: RE | Disposition: A | Discharge status: Home / Self Care | Payer: Self-pay | Source: Ambulatory Visit | Attending: Urology

## 2017-01-07 ENCOUNTER — Ambulatory Visit
Admission: RE | Admit: 2017-01-07 | Discharge: 2017-01-07 | Disposition: A | Discharge status: Home / Self Care | Payer: Managed Care, Other (non HMO) | Source: Ambulatory Visit | Attending: Urology | Admitting: Urology

## 2017-01-07 ENCOUNTER — Encounter: Payer: Self-pay | Admitting: *Deleted

## 2017-01-07 DIAGNOSIS — Z7982 Long term (current) use of aspirin: Secondary | ICD-10-CM | POA: Diagnosis not present

## 2017-01-07 DIAGNOSIS — N201 Calculus of ureter: Secondary | ICD-10-CM | POA: Diagnosis present

## 2017-01-07 HISTORY — PX: EXTRACORPOREAL SHOCK WAVE LITHOTRIPSY: SHX1557

## 2017-01-07 SURGERY — LITHOTRIPSY, ESWL
Anesthesia: Moderate Sedation | Laterality: Left

## 2017-01-07 MED ORDER — DIPHENHYDRAMINE HCL 25 MG PO CAPS
25.0000 mg | ORAL_CAPSULE | ORAL | Status: AC
  Administered 2017-01-07: 25 mg via ORAL

## 2017-01-07 MED ORDER — DIAZEPAM 5 MG PO TABS
10.0000 mg | ORAL_TABLET | ORAL | Status: AC
  Administered 2017-01-07: 10 mg via ORAL

## 2017-01-07 MED ORDER — DIAZEPAM 5 MG PO TABS
ORAL_TABLET | ORAL | Status: AC
  Administered 2017-01-07: 10 mg via ORAL
  Filled 2017-01-07: qty 2

## 2017-01-07 MED ORDER — DIPHENHYDRAMINE HCL 25 MG PO CAPS
ORAL_CAPSULE | ORAL | Status: AC
  Administered 2017-01-07: 25 mg via ORAL
  Filled 2017-01-07: qty 1

## 2017-01-07 MED ORDER — CIPROFLOXACIN HCL 500 MG PO TABS
ORAL_TABLET | ORAL | Status: AC
  Administered 2017-01-07: 500 mg via ORAL
  Filled 2017-01-07: qty 1

## 2017-01-07 MED ORDER — CIPROFLOXACIN HCL 500 MG PO TABS
500.0000 mg | ORAL_TABLET | ORAL | Status: AC
  Administered 2017-01-07: 500 mg via ORAL

## 2017-01-07 MED ORDER — DEXTROSE-NACL 5-0.45 % IV SOLN
INTRAVENOUS | Status: DC
  Administered 2017-01-07: 13:00:00 via INTRAVENOUS

## 2017-01-07 NOTE — Discharge Instructions (Signed)

## 2017-01-11 ENCOUNTER — Encounter: Payer: Self-pay | Admitting: Radiology

## 2017-01-21 ENCOUNTER — Other Ambulatory Visit: Payer: Self-pay | Admitting: Urology

## 2017-01-21 ENCOUNTER — Ambulatory Visit
Admission: RE | Admit: 2017-01-21 | Discharge: 2017-01-21 | Disposition: A | Discharge status: Home / Self Care | Payer: Managed Care, Other (non HMO) | Source: Ambulatory Visit | Attending: Urology | Admitting: Urology

## 2017-01-21 ENCOUNTER — Ambulatory Visit (INDEPENDENT_AMBULATORY_CARE_PROVIDER_SITE_OTHER): Payer: Managed Care, Other (non HMO) | Admitting: Urology

## 2017-01-21 ENCOUNTER — Encounter: Payer: Self-pay | Admitting: Urology

## 2017-01-21 VITALS — BP 150/94 | HR 86 | Ht 70.0 in | Wt 215.0 lb

## 2017-01-21 DIAGNOSIS — N529 Male erectile dysfunction, unspecified: Secondary | ICD-10-CM

## 2017-01-21 DIAGNOSIS — N200 Calculus of kidney: Secondary | ICD-10-CM

## 2017-01-21 DIAGNOSIS — Z87442 Personal history of urinary calculi: Secondary | ICD-10-CM | POA: Insufficient documentation

## 2017-01-21 DIAGNOSIS — Z125 Encounter for screening for malignant neoplasm of prostate: Secondary | ICD-10-CM

## 2017-01-21 MED ORDER — SILDENAFIL CITRATE 20 MG PO TABS: ORAL_TABLET | ORAL | 3 refills | Status: DC

## 2017-01-21 NOTE — Addendum Note (Signed)
Addended by: Kyra Manges on: 01/21/2017 03:11 PM   Modules accepted: Orders

## 2017-01-21 NOTE — Progress Notes (Signed)
01/21/2017 3:02 PM   Richard Warren [birth year] [id number]  Referring provider: Birdie Sons, MD [address] [place], [place] [address]  Chief Complaint  Patient presents with  . Routine Post Op    litho w/KUB    HPI: The patient is a 61 year old gentleman presents today after undergoing lithotripsy for a left 6 mm mid ureteral calculus on 01/07/2017. He has brought stone fragments in with him today. He underwent a KUB today which shows resolution of nephrolithiasis. This was his first stone. He has no symptoms at this time.  The patient is due for prostate cancer screening. He has not had this a number of years.  The patient also notes erectile dysfunction. He is unable to maintain erection long enough to complete intercourse. He is interested in trying medication. He does not have any cardiac issues. He is able to walk up one to 2 flights of stairs without difficulty. He does not take nitrates for chest pain.   PMH: Past Medical History:  Diagnosis Date  . Colon polyp   . GERD (gastroesophageal reflux disease)   . Hyperlipidemia   . Kidney stone     Surgical History: Past Surgical History:  Procedure Laterality Date  . APPENDECTOMY  1968?  . COLONOSCOPY  06-28-09   Dr Bary Castilla  . EXTRACORPOREAL SHOCK WAVE LITHOTRIPSY Left 01/07/2017   Procedure: EXTRACORPOREAL SHOCK WAVE LITHOTRIPSY (ESWL);  Surgeon: Nickie Retort, MD;  Location: [facility] ORS;  Service: Urology;  Laterality: Left;  . UPPER GI ENDOSCOPY  06-28-09   Dr Bary Castilla  . VASECTOMY      Home Medications:  Allergies as of 01/21/2017      Reactions   Zolpidem    Depression      Medication List       Accurate as of 01/21/17  3:02 PM. Always use your most recent med list.          ACIDOPHILUS PO Take by mouth.   CINNAMON PO Take by mouth.   CO Q 10 PO Take by mouth.   COCONUT OIL PO Take by mouth.   GARLIC PO Take by mouth.   lovastatin 40 MG tablet Commonly known as:   MEVACOR TAKE ONE TABLET BY MOUTH AT BEDTIME   MEGA BASIC PO Take by mouth.   Milk Thistle 250 MG Caps Take by mouth.   NEXIUM PO Take by mouth.   OMEGA 3 PO Take 3,000 mg by mouth 2 (two) times daily.   Selenium 200 MCG Caps Take by mouth daily.   sildenafil 20 MG tablet Commonly known as:  REVATIO Take 1 to 5 tabs PO daily prn   VITAMIN B12 PO Take by mouth daily.   VITAMIN D-3 PO Take by mouth.   vitamin E 400 UNIT capsule Take 400 Units by mouth daily.   VITAMIN-B COMPLEX PO Take by mouth.       Allergies:  Allergies  Allergen Reactions  . Zolpidem     Depression    Family History: Family History  Problem Relation Age of Onset  . Dementia Mother   . Heart attack Sister   . Hypertension Brother   . Coronary artery disease Sister   . Prostate cancer Neg Hx   . Bladder Cancer Neg Hx   . Kidney cancer Neg Hx     Social History:  reports that he has never smoked. He has never used smokeless tobacco. He reports that he drinks about 31.8 oz of alcohol per  week . He reports that he does not use drugs.  ROS: UROLOGY Frequent Urination?: No Hard to postpone urination?: No Burning/pain with urination?: No Get up at night to urinate?: No Leakage of urine?: No Urine stream starts and stops?: No Trouble starting stream?: No Do you have to strain to urinate?: No Blood in urine?: No Urinary tract infection?: No Sexually transmitted disease?: No Injury to kidneys or bladder?: No Painful intercourse?: No Weak stream?: No Erection problems?: No Penile pain?: No  Gastrointestinal Nausea?: No Vomiting?: No Indigestion/heartburn?: No Diarrhea?: No Constipation?: No  Constitutional Fever: No Night sweats?: No Weight loss?: No Fatigue?: No  Skin Skin rash/lesions?: No Itching?: No  Eyes Blurred vision?: No Double vision?: No  Ears/Nose/Throat Sore throat?: No Sinus problems?: No  Hematologic/Lymphatic Swollen glands?: No Easy  bruising?: No  Cardiovascular Leg swelling?: No Chest pain?: No  Respiratory Cough?: No Shortness of breath?: No  Endocrine Excessive thirst?: No  Musculoskeletal Back pain?: No Joint pain?: No  Neurological Headaches?: No Dizziness?: No  Psychologic Depression?: No Anxiety?: No  Physical Exam: BP (!) 150/94   Pulse 86   Ht 5\' 10"  (1.778 m)   Wt 215 lb (97.5 kg)   BMI 30.85 kg/m   Constitutional:  Alert and oriented, No acute distress. HEENT: [place] AT, moist mucus membranes.  Trachea midline, no masses. Cardiovascular: No clubbing, cyanosis, or edema. Respiratory: Normal respiratory effort, no increased work of breathing. GI: Abdomen is soft, nontender, nondistended, no abdominal masses GU: No CVA tenderness. DRE: 2+ smooth benign Skin: No rashes, bruises or suspicious lesions. Lymph: No cervical or inguinal adenopathy. Neurologic: Grossly intact, no focal deficits, moving all 4 extremities. Psychiatric: Normal mood and affect.  Laboratory Data: Lab Results  Component Value Date   WBC 5.9 12/30/2016   HGB 15.2 12/30/2016   HCT 44.7 12/30/2016   MCV 94.1 12/30/2016   PLT 203 12/30/2016    Lab Results  Component Value Date   CREATININE 0.94 12/30/2016    No results found for: PSA  No results found for: TESTOSTERONE  No results found for: HGBA1C  Urinalysis    Component Value Date/Time   COLORURINE YELLOW (A) 12/30/2016 1841   APPEARANCEUR Clear 01/01/2017 1059   LABSPEC 1.019 12/30/2016 1841   PHURINE 6.0 12/30/2016 1841   GLUCOSEU Negative 01/01/2017 1059   HGBUR LARGE (A) 12/30/2016 1841   BILIRUBINUR Negative 01/01/2017 1059   KETONESUR NEGATIVE 12/30/2016 1841   PROTEINUR 1+ (A) 01/01/2017 1059   PROTEINUR 30 (A) 12/30/2016 1841   UROBILINOGEN 0.2 04/02/2015 0817   NITRITE Negative 01/01/2017 1059   NITRITE NEGATIVE 12/30/2016 1841   LEUKOCYTESUR Negative 01/01/2017 1059    Pertinent Imaging: KUB reviewed as above  Assessment &  Plan:    1. Nephrolithiasis This is the patient's first stone episode. We went over the ABC's his stone formation. We discussed stone prevention assuming that he has a calcium oxalate stone. We will send the stone for analysis.  2. Erectile dysfunction The patient was started on sildenafil 1-5 tablets by mouth daily as needed. He was warned of risk of priapism need for emergent intervention.  3. Prostate cancer screening Due for PSA today  There are no diagnoses linked to this encounter.  Return in about 1 year (around 01/21/2018).  Nickie Retort, MD  [facility] [address], [place], [place] [address] [phone]

## 2017-01-22 ENCOUNTER — Telehealth: Payer: Self-pay

## 2017-01-22 LAB — PSA TOTAL (REFLEX TO FREE): Prostate Specific Ag, Serum: 0.9 ng/mL (ref 0.0–4.0)

## 2017-01-22 NOTE — Telephone Encounter (Signed)
Richard Retort, MD  [name], LPN        Please let patient know PSA is normal.    Spoke with pt in reference to PSA results. Pt voiced understanding.

## 2017-01-26 ENCOUNTER — Other Ambulatory Visit: Payer: Self-pay | Admitting: Urology

## 2017-05-14 ENCOUNTER — Encounter: Payer: Self-pay | Admitting: Family Medicine

## 2017-05-14 ENCOUNTER — Other Ambulatory Visit: Payer: Self-pay | Admitting: Family Medicine

## 2017-05-14 ENCOUNTER — Ambulatory Visit (INDEPENDENT_AMBULATORY_CARE_PROVIDER_SITE_OTHER): Payer: Managed Care, Other (non HMO) | Admitting: Family Medicine

## 2017-05-14 VITALS — BP 140/96 | HR 81 | Temp 98.2°F | Wt 216.6 lb

## 2017-05-14 DIAGNOSIS — Z1159 Encounter for screening for other viral diseases: Secondary | ICD-10-CM | POA: Diagnosis not present

## 2017-05-14 DIAGNOSIS — R059 Cough, unspecified: Secondary | ICD-10-CM

## 2017-05-14 DIAGNOSIS — R5381 Other malaise: Secondary | ICD-10-CM

## 2017-05-14 DIAGNOSIS — E7800 Pure hypercholesterolemia, unspecified: Secondary | ICD-10-CM

## 2017-05-14 DIAGNOSIS — R05 Cough: Secondary | ICD-10-CM | POA: Diagnosis not present

## 2017-05-14 DIAGNOSIS — E782 Mixed hyperlipidemia: Secondary | ICD-10-CM | POA: Diagnosis not present

## 2017-05-14 NOTE — Progress Notes (Signed)
Patient: Richard Warren Male    DOB: 26-Dec-1955   61 y.o.   MRN: [id number] Visit Date: 05/14/2017  Today's Provider: Vernie Murders, PA   Chief Complaint  Patient presents with  . URI   Subjective:    URI   This is a new problem. Episode onset: Wednesday. The problem has been gradually worsening. There has been no fever. Associated symptoms include congestion, coughing, headaches, rhinorrhea, sneezing and a sore throat. Pertinent negatives include no ear pain. Treatments tried: Mucinex  The treatment provided mild relief.   Past Medical History:  Diagnosis Date  . Colon polyp   . GERD (gastroesophageal reflux disease)   . Hyperlipidemia   . Kidney stone    Past Surgical History:  Procedure Laterality Date  . APPENDECTOMY  1968?  . COLONOSCOPY  06-28-09   Dr Bary Castilla  . EXTRACORPOREAL SHOCK WAVE LITHOTRIPSY Left 01/07/2017   Procedure: EXTRACORPOREAL SHOCK WAVE LITHOTRIPSY (ESWL);  Surgeon: Nickie Retort, MD;  Location: [facility] ORS;  Service: Urology;  Laterality: Left;  . UPPER GI ENDOSCOPY  06-28-09   Dr Bary Castilla  . VASECTOMY     Family History  Problem Relation Age of Onset  . Dementia Mother   . Heart attack Sister   . Hypertension Brother   . Coronary artery disease Sister   . Prostate cancer Neg Hx   . Bladder Cancer Neg Hx   . Kidney cancer Neg Hx    Allergies  Allergen Reactions  . Zolpidem     Depression    Lipid/Cholesterol, Follow-up:   Last seen for this 6 months ago.  Management since that visit includes continue medications. Restrict salt intake. Encouraged to increase water intake.  Last Lipid Panel:  Lab Results  Component Value Date   CHOL 165 10/26/2016   HDL 35 (L) 10/26/2016   LDLCALC 87 10/26/2016   TRIG 215 (H) 10/26/2016   CHOLHDL 4.7 10/26/2016    He reports good compliance with treatment. He is not having side effects.   Wt Readings from Last 3 Encounters:  05/14/17 216 lb 9.6 oz (98.2 kg)  01/21/17 215 lb (97.5 kg)    01/07/17 216 lb (98 kg)     Previous Medications   B COMPLEX VITAMINS (VITAMIN-B COMPLEX PO)    Take by mouth.   CHOLECALCIFEROL (VITAMIN D-3 PO)    Take by mouth.   CINNAMON PO    Take by mouth.   COCONUT OIL PO    Take by mouth.   COENZYME Q10 (CO Q 10 PO)    Take by mouth.   CYANOCOBALAMIN (VITAMIN B12 PO)    Take by mouth daily.   ESOMEPRAZOLE MAGNESIUM (NEXIUM PO)    Take by mouth.   GARLIC PO    Take by mouth.   LACTOBACILLUS (ACIDOPHILUS PO)    Take by mouth.   LOVASTATIN (MEVACOR) 40 MG TABLET    TAKE ONE TABLET BY MOUTH ONCE DAILY AT BEDTIME   METRONIDAZOLE (METROGEL) 0.75 % GEL       MILK THISTLE 250 MG CAPS    Take by mouth.   MULTIPLE VITAMINS-MINERALS (MEGA BASIC PO)    Take by mouth.   OMEGA-3 FATTY ACIDS (OMEGA 3 PO)    Take 3,000 mg by mouth 2 (two) times daily.   SELENIUM 200 MCG CAPS    Take by mouth daily.   SILDENAFIL (REVATIO) 20 MG TABLET    Take 1 to 5 tabs PO daily prn   VITAMIN E 400  UNIT CAPSULE    Take 400 Units by mouth daily.    Review of Systems  HENT: Positive for congestion, rhinorrhea, sneezing and sore throat. Negative for ear pain.   Respiratory: Positive for cough.   Neurological: Positive for headaches.    Social History  Substance Use Topics  . Smoking status: Never Smoker  . Smokeless tobacco: Never Used  . Alcohol use 31.8 oz/week    50 Standard drinks or equivalent, 3 Cans of beer per week     Comment: 2 days ago last drink   Objective:   BP (!) 140/96 (BP Location: Right Arm, Patient Position: Sitting, Cuff Size: Normal)   Pulse 81   Temp 98.2 F (36.8 C) (Oral)   Wt 216 lb 9.6 oz (98.2 kg)   SpO2 96%   BMI 31.08 kg/m   Physical Exam  Constitutional: He is oriented to person, place, and time. He appears well-developed and well-nourished. No distress.  HENT:  Head: Normocephalic and atraumatic.  Right Ear: Hearing normal.  Left Ear: Hearing normal.  Nose: Nose normal.  Eyes: Conjunctivae and lids are normal. Right eye  exhibits no discharge. Left eye exhibits no discharge. No scleral icterus.  Neck: Neck supple.  Cardiovascular: Normal rate and regular rhythm.   Pulmonary/Chest: Effort normal and breath sounds normal. No respiratory distress.  Abdominal: Soft. Bowel sounds are normal.  Musculoskeletal: Normal range of motion.  Lymphadenopathy:    He has no cervical adenopathy.  Neurological: He is alert and oriented to person, place, and time.  Skin: Skin is intact. No lesion and no rash noted.  Psychiatric: He has a normal mood and affect. His speech is normal and behavior is normal. Thought content normal.      Assessment & Plan:     1. Malaise Onset with sore throat 2 days ago. No fever, earache or GI upset. Some headache and rhinorrhea. May use antihistamine with Mucinex-DM for cough. Will check CBC to rule out impending infection. Increase fluid intake and home to rest. Given note for out of work today. - CBC with Differential/Platelet  2. Cough Onset yesterday with malaise and some sore throat initially. Mucinex-DM helps a great deal. Will check CBC for signs of infection. Suspect viral illness. - CBC with Differential/Platelet  3. Combined fat and carbohydrate induced hyperlipemia Tolerating Lovastatin 40 mg qd without side effects. Will recheck labs and continue low fat diet. Need to exercise regularly and lose some weight. Recheck pending reports. - Lipid panel - Comprehensive metabolic panel  4. Need for hepatitis C screening test - Hepatitis C Antibody

## 2017-05-15 LAB — COMPREHENSIVE METABOLIC PANEL
AG RATIO: 1.5 (calc) (ref 1.0–2.5)
ALKALINE PHOSPHATASE (APISO): 37 U/L — AB (ref 40–115)
ALT: 23 U/L (ref 9–46)
AST: 18 U/L (ref 10–35)
Albumin: 4.1 g/dL (ref 3.6–5.1)
BILIRUBIN TOTAL: 0.7 mg/dL (ref 0.2–1.2)
BUN: 12 mg/dL (ref 7–25)
CALCIUM: 9.2 mg/dL (ref 8.6–10.3)
CO2: 25 mmol/L (ref 20–32)
Chloride: 104 mmol/L (ref 98–110)
Creat: 0.81 mg/dL (ref 0.70–1.33)
Globulin: 2.8 g/dL (calc) (ref 1.9–3.7)
Glucose, Bld: 94 mg/dL (ref 65–99)
Potassium: 4.4 mmol/L (ref 3.5–5.3)
SODIUM: 141 mmol/L (ref 135–146)
TOTAL PROTEIN: 6.9 g/dL (ref 6.1–8.1)

## 2017-05-15 LAB — CBC WITH DIFFERENTIAL/PLATELET
BASOS ABS: 33 {cells}/uL (ref 0–200)
Basophils Relative: 0.5 %
EOS ABS: 163 {cells}/uL (ref 15–500)
EOS PCT: 2.5 %
HEMATOCRIT: 45.3 % (ref 38.5–50.0)
Hemoglobin: 15.5 g/dL (ref 13.2–17.1)
LYMPHS ABS: 1008 {cells}/uL (ref 850–3900)
MCH: 32.2 pg (ref 27.0–33.0)
MCHC: 34.2 g/dL (ref 32.0–36.0)
MCV: 94 fL (ref 80.0–100.0)
MPV: 10.4 fL (ref 7.5–12.5)
Monocytes Relative: 9 %
NEUTROS PCT: 72.5 %
Neutro Abs: 4713 cells/uL (ref 1500–7800)
Platelets: 194 10*3/uL (ref 140–400)
RBC: 4.82 10*6/uL (ref 4.20–5.80)
RDW: 12.4 % (ref 11.0–15.0)
Total Lymphocyte: 15.5 %
WBC mixed population: 585 cells/uL (ref 200–950)
WBC: 6.5 10*3/uL (ref 3.8–10.8)

## 2017-05-15 LAB — HEPATITIS C ANTIBODY
HEP C AB: NONREACTIVE
SIGNAL TO CUT-OFF: 0.01 (ref <1.00)

## 2017-05-15 LAB — LIPID PANEL
CHOL/HDL RATIO: 4 (calc) (ref <5.0)
CHOLESTEROL: 186 mg/dL (ref <200)
HDL: 46 mg/dL (ref >40)
LDL Cholesterol (Calc): 110 mg/dL (calc) — ABNORMAL HIGH
NON-HDL CHOLESTEROL (CALC): 140 mg/dL — AB (ref <130)
TRIGLYCERIDES: 182 mg/dL — AB (ref <150)

## 2017-12-14 IMAGING — CT CT RENAL STONE PROTOCOL
2 of 4 series · 16 of 46 positions shown, 18 images · non-contrast
Comparison: None.

CLINICAL DATA: Left flank pain since [REDACTED]

EXAM:
CT ABDOMEN AND PELVIS WITHOUT CONTRAST
TECHNIQUE: Multidetector CT imaging of the abdomen and pelvis was performed
following the standard protocol without IV contrast.

[Series 2: stone full standard · axial · 0.92mm/px · z∈[-608,-173]mm · 13 of 97 slices shown, 15 images]
[im 5/97  soft-tissue]
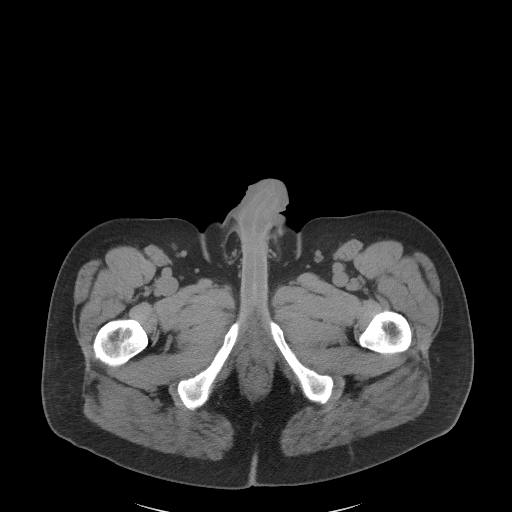
[im 5/97  bone]
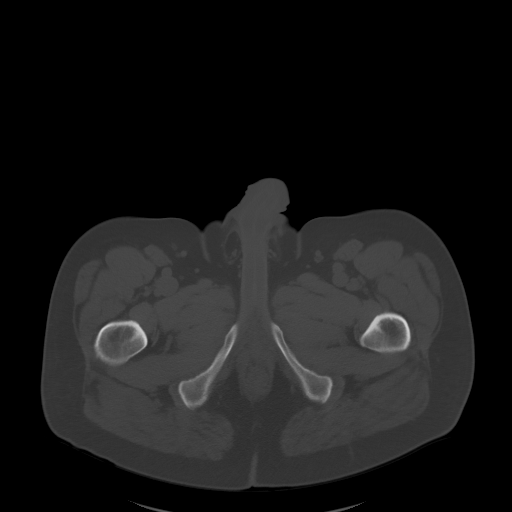
[im 13/97  soft-tissue]
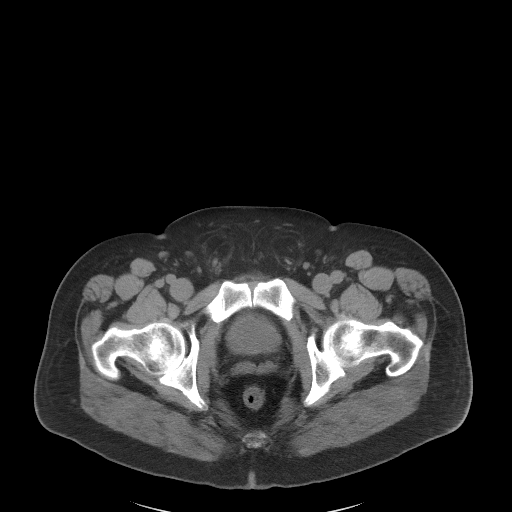
[im 21/97  soft-tissue]
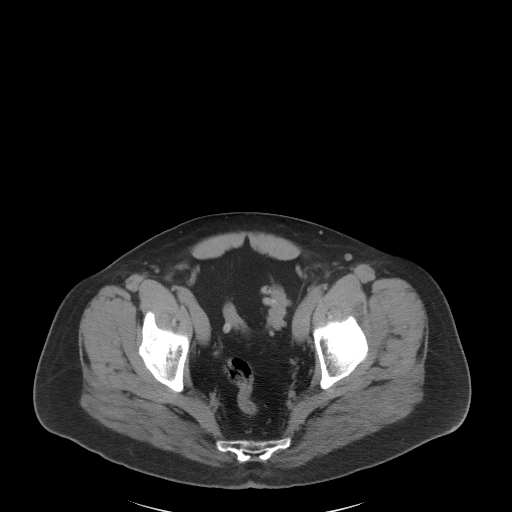
[im 26/97  soft-tissue]
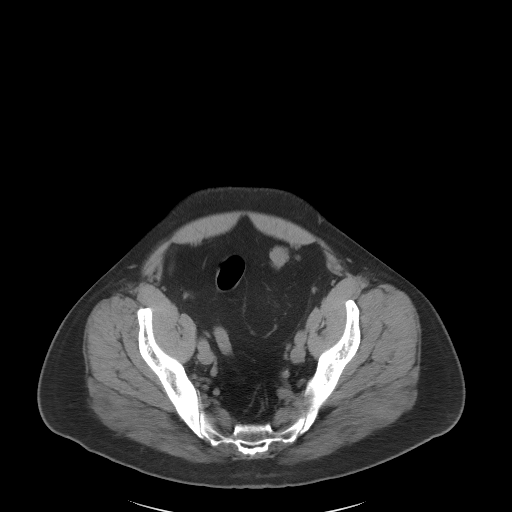
[im 34/97  soft-tissue]
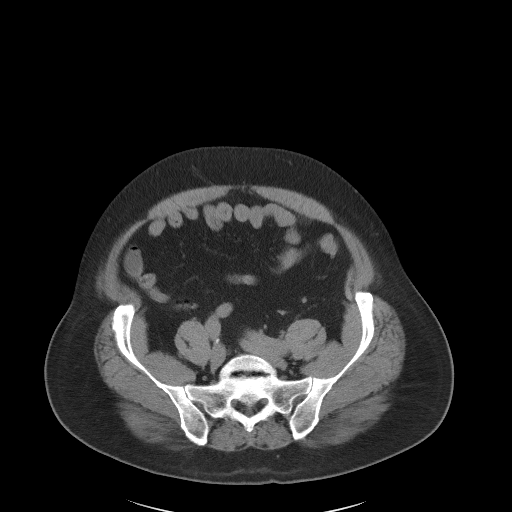
[im 42/97  soft-tissue]
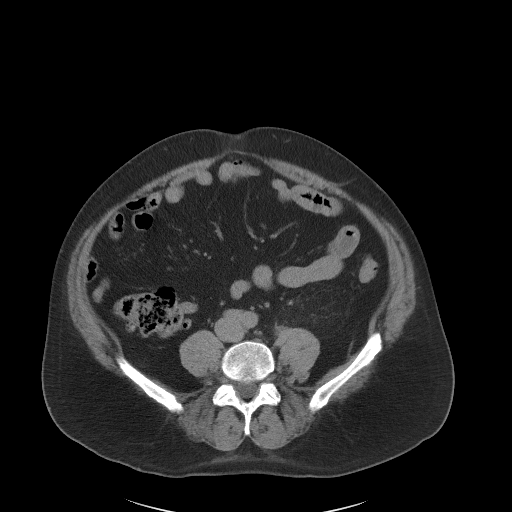
[im 51/97  soft-tissue]
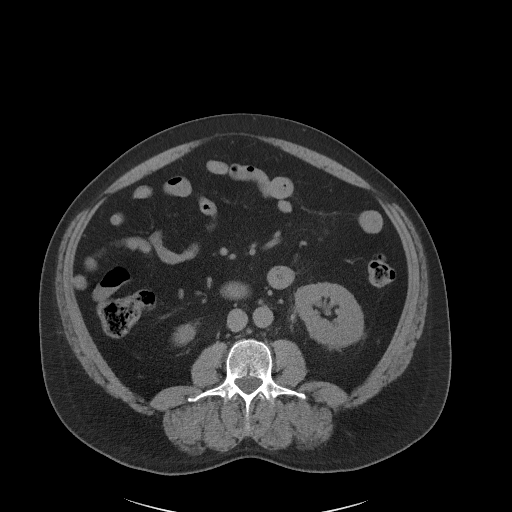
[im 55/97  soft-tissue]
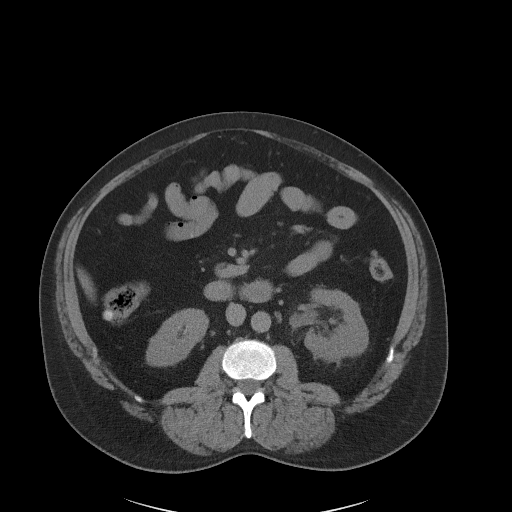
[im 63/97  soft-tissue]
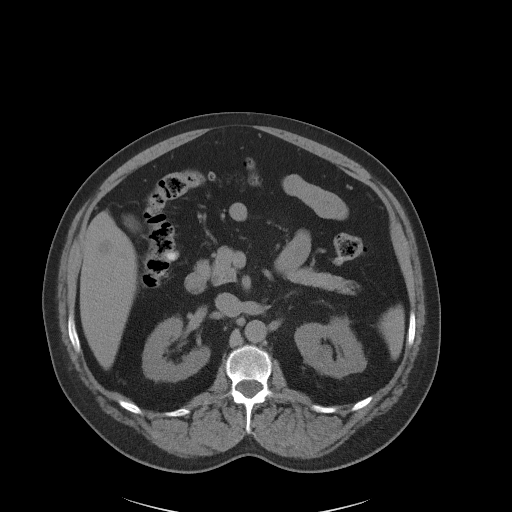
[im 63/97  bone]
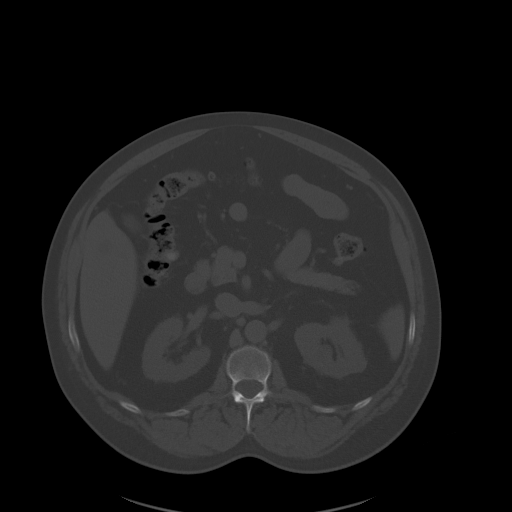
[im 71/97  soft-tissue]
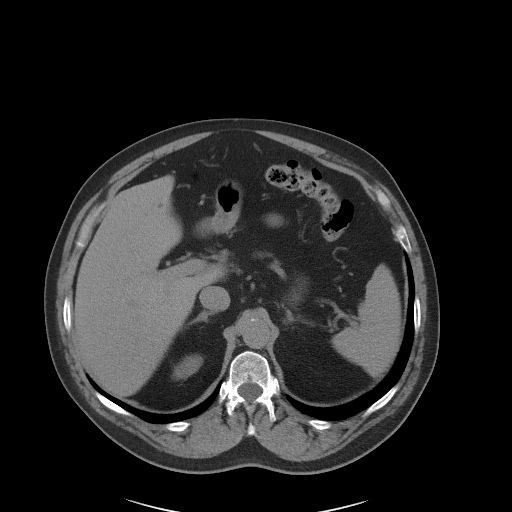
[im 76/97  soft-tissue]
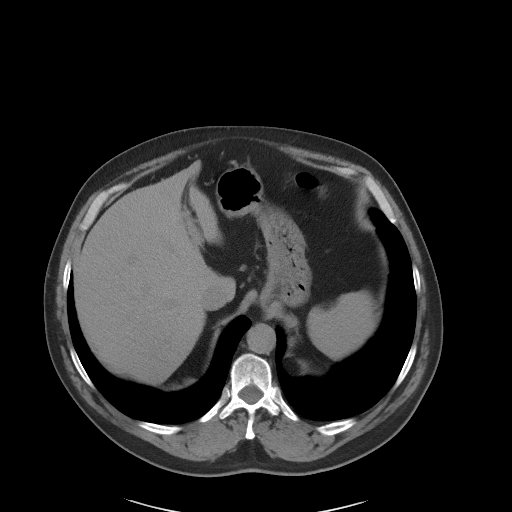
[im 84/97  soft-tissue]
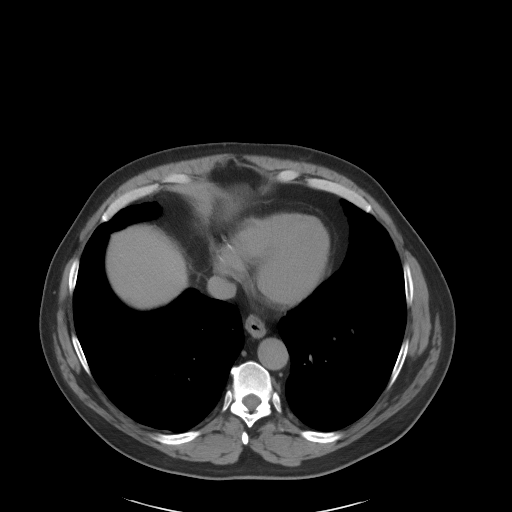
[im 92/97  soft-tissue]
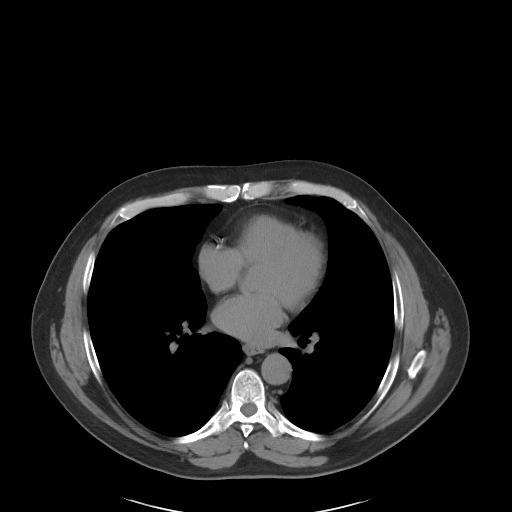

[Series 5: coronal · coronal · 0.81mm/px · 3 of 166 slices shown]
[im 56/166  soft-tissue]
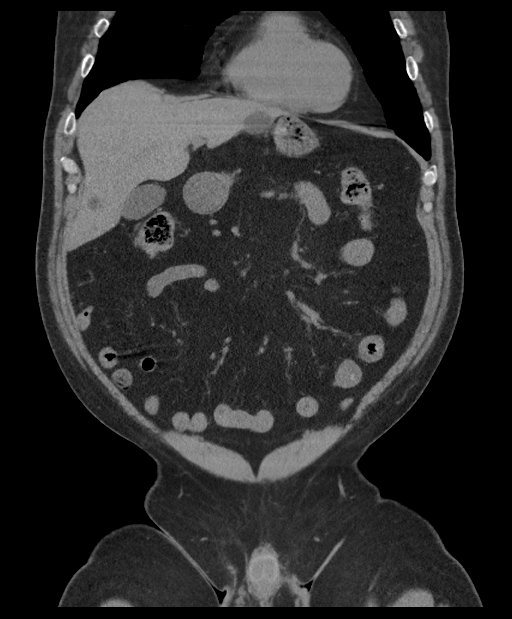
[im 74/166  soft-tissue]
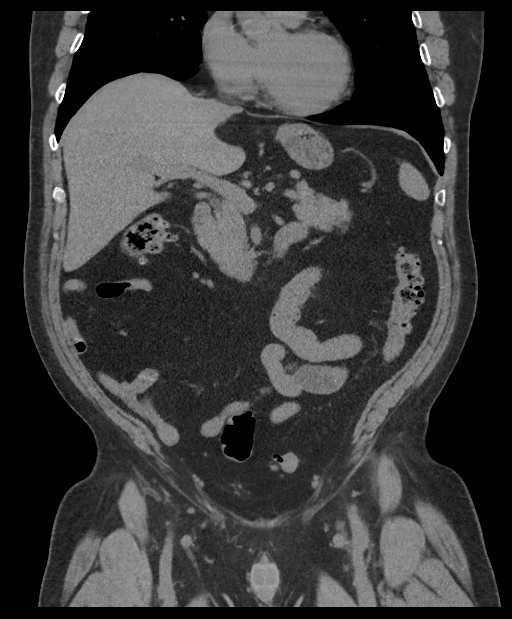
[im 92/166  soft-tissue]
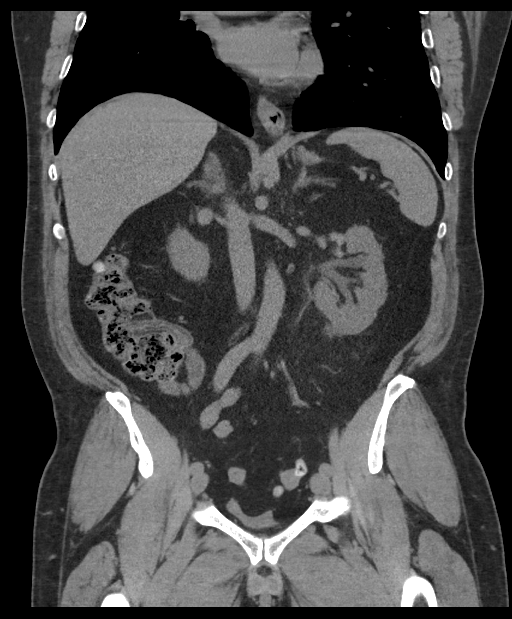

[16 of 46 positions shown; findings below may reference images not displayed]

FINDINGS: Lower chest: Coronary arteriosclerosis of the visualized heart.
Clear lung bases.

Hepatobiliary: Stable water attenuating cysts of the liver, the
largest is in the left hepatic lobe measuring approximately 3.2 cm
and is without significant change. No biliary dilatation. No
gallstones.

Pancreas: Unremarkable. No pancreatic ductal dilatation or
surrounding inflammatory changes.

Spleen: Normal in size without focal abnormality.

Adrenals/Urinary Tract: There is a 7 x 4 x 3 mm (craniocaudad by
transverse by AP) calculus at the left ureteropelvic junction
contributing to mild left hydronephrosis and perinephric fat
stranding. The right kidney is unremarkable as are both adrenal
glands. No distal uropathy is noted. The bladder is contracted.

Stomach/Bowel: Contracted stomach. Normal small bowel rotation
without obstruction or inflammation. Status post appendectomy.
Colonic diverticulosis along the descending and sigmoid colon
without acute diverticulitis.

Vascular/Lymphatic: Aortic atherosclerosis. No enlarged abdominal or
pelvic lymph nodes.

Reproductive: Prostate and seminal vesicles are unremarkable.

Other: Fat noted within both inguinal canals possibly representing
spermatic cord lipomas. Small fat containing umbilical hernia.

Musculoskeletal: Degenerative disc disease L4-5 and L5-S1. No acute
nor suspicious osseous abnormality.
IMPRESSION: 1. 7 x 4 x 3 mm left UPJ stone causing left-sided hydronephrosis.
2. Colonic diverticulosis without acute diverticulitis.
3. Hepatic cysts are again noted the largest approximately 3.2 cm in
the left hepatic lobe.
4. Coronary arteriosclerosis and aortic atherosclerosis.

## 2018-01-27 ENCOUNTER — Ambulatory Visit: Payer: Managed Care, Other (non HMO)

## 2018-04-18 ENCOUNTER — Encounter: Payer: Self-pay | Admitting: Urology

## 2018-04-18 ENCOUNTER — Other Ambulatory Visit: Payer: Self-pay | Admitting: Family Medicine

## 2018-04-18 ENCOUNTER — Other Ambulatory Visit: Payer: Self-pay

## 2018-04-18 ENCOUNTER — Ambulatory Visit: Admission: RE | Admit: 2018-04-18 | Payer: Managed Care, Other (non HMO) | Source: Ambulatory Visit | Admitting: Urology

## 2018-04-18 ENCOUNTER — Ambulatory Visit: Payer: Managed Care, Other (non HMO) | Admitting: Urology

## 2018-04-18 ENCOUNTER — Telehealth: Payer: Self-pay | Admitting: Urology

## 2018-04-18 VITALS — BP 144/87 | HR 77 | Ht 69.0 in | Wt 206.6 lb

## 2018-04-18 DIAGNOSIS — Z87442 Personal history of urinary calculi: Secondary | ICD-10-CM | POA: Insufficient documentation

## 2018-04-18 DIAGNOSIS — N401 Enlarged prostate with lower urinary tract symptoms: Secondary | ICD-10-CM | POA: Insufficient documentation

## 2018-04-18 DIAGNOSIS — N200 Calculus of kidney: Secondary | ICD-10-CM

## 2018-04-18 DIAGNOSIS — R3911 Hesitancy of micturition: Secondary | ICD-10-CM

## 2018-04-18 HISTORY — DX: Personal history of urinary calculi: Z87.442

## 2018-04-18 NOTE — Progress Notes (Signed)
04/18/2018 9:22 AM   Richard Warren 1956/01/08 [id number]  Referring provider: Birdie Sons, MD [address] [place], [place] [address]  Chief Complaint  Patient presents with  . Nephrolithiasis    HPI: 62 year old male presents for annual follow-up.  He last saw Dr. Pilar Jarvis in May 2018.  He underwent shockwave lithotripsy of a ureteral calculus that month.  He had also been complaining of erectile dysfunction and was given an Rx for generic sildenafil.  He states he never took the sildenafil.  He denies recurrent flank pain.  He has mild urinary hesitancy.  Denies dysuria or gross hematuria.   PMH: Past Medical History:  Diagnosis Date  . Colon polyp   . GERD (gastroesophageal reflux disease)   . Hyperlipidemia   . Kidney stone     Surgical History: Past Surgical History:  Procedure Laterality Date  . APPENDECTOMY  1968?  . COLONOSCOPY  06-28-09   Dr Bary Castilla  . EXTRACORPOREAL SHOCK WAVE LITHOTRIPSY Left 01/07/2017   Procedure: EXTRACORPOREAL SHOCK WAVE LITHOTRIPSY (ESWL);  Surgeon: Nickie Retort, MD;  Location: [facility] ORS;  Service: Urology;  Laterality: Left;  . UPPER GI ENDOSCOPY  06-28-09   Dr Bary Castilla  . VASECTOMY      Home Medications:  Allergies as of 04/18/2018      Reactions   Zolpidem    Depression      Medication List        Accurate as of 04/18/18  9:22 AM. Always use your most recent med list.          ACIDOPHILUS PO Take by mouth.   CINNAMON PO Take by mouth.   CO Q 10 PO Take by mouth.   COCONUT OIL PO Take by mouth.   GARLIC PO Take by mouth.   lovastatin 40 MG tablet Commonly known as:  MEVACOR TAKE ONE TABLET BY MOUTH ONCE DAILY AT BEDTIME   MEGA BASIC PO Take by mouth.   metroNIDAZOLE 0.75 % gel Commonly known as:  METROGEL   Milk Thistle 250 MG Caps Take by mouth.   NEXIUM PO Take by mouth.   OMEGA 3 PO Take 3,000 mg by mouth 2 (two) times daily.   Selenium 200 MCG Caps Take by mouth  daily.   VITAMIN B12 PO Take by mouth daily.   VITAMIN D-3 PO Take by mouth.   vitamin E 400 UNIT capsule Take 400 Units by mouth daily.   VITAMIN-B COMPLEX PO Take by mouth.       Allergies:  Allergies  Allergen Reactions  . Zolpidem     Depression    Family History: Family History  Problem Relation Age of Onset  . Dementia Mother   . Heart attack Sister   . Hypertension Brother   . Coronary artery disease Sister   . Prostate cancer Neg Hx   . Bladder Cancer Neg Hx   . Kidney cancer Neg Hx     Social History:  reports that he has never smoked. He has never used smokeless tobacco. He reports that he drinks about 53.0 standard drinks of alcohol per week. He reports that he does not use drugs.  ROS: UROLOGY Frequent Urination?: No Hard to postpone urination?: No Burning/pain with urination?: No Get up at night to urinate?: No Leakage of urine?: No Urine stream starts and stops?: No Trouble starting stream?: Yes Do you have to strain to urinate?: Yes Blood in urine?: No Urinary tract infection?: No Sexually transmitted disease?: No  Injury to kidneys or bladder?: No Painful intercourse?: No Weak stream?: No Erection problems?: No Penile pain?: No  Gastrointestinal Nausea?: No Vomiting?: No Indigestion/heartburn?: Yes Diarrhea?: No Constipation?: No  Constitutional Fever: No Night sweats?: No Weight loss?: No Fatigue?: No  Skin Skin rash/lesions?: No Itching?: No  Eyes Blurred vision?: No Double vision?: No  Ears/Nose/Throat Sore throat?: No Sinus problems?: No  Hematologic/Lymphatic Swollen glands?: No Easy bruising?: No  Cardiovascular Leg swelling?: No Chest pain?: No  Respiratory Cough?: No Shortness of breath?: No  Endocrine Excessive thirst?: No  Musculoskeletal Back pain?: No Joint pain?: No  Neurological Headaches?: No Dizziness?: No  Psychologic Depression?: No Anxiety?: No  Physical Exam: BP (!) 144/87    Pulse 77   Ht 5\' 9"  (1.753 m)   Wt 206 lb 9.6 oz (93.7 kg)   BMI 30.51 kg/m   Constitutional:  Alert and oriented, No acute distress. HEENT: [place] AT, moist mucus membranes.  Trachea midline, no masses. Cardiovascular: No clubbing, cyanosis, or edema. Respiratory: Normal respiratory effort, no increased work of breathing. GI: Abdomen is soft, nontender, nondistended, no abdominal masses GU: No CVA tenderness.  Prostate 35 g, smooth without nodules Lymph: No cervical or inguinal lymphadenopathy. Skin: No rashes, bruises or suspicious lesions. Neurologic: Grossly intact, no focal deficits, moving all 4 extremities. Psychiatric: Normal mood and affect.     Assessment & Plan:   1.  Personal history of urinary tract stones He is asymptomatic.  A KUB was ordered and he will be notified with results.  Continue annual follow-up.  2.  Erectile dysfunction He does not desire any further treatment at this time.  3.  BPH with lower urinary tract symptoms He has mild urinary hesitancy which is not bothersome.  Prostate cancer screening was discussed and he has elected to have a PSA drawn.  If stable continue annual follow-up.   Abbie Sons, [place] [address], [place],  [address] [phone]

## 2018-04-18 NOTE — Telephone Encounter (Signed)
[facility] called to let office know pt showed up for KUB, orders were not in the system, [place] from [facility] called to have orders entered, pt got irritated waiting, Anderson [place] states orders are still not in system and pt has left to go home to go to bed. Just FYI that pt didn't get his KUB today.

## 2018-04-18 NOTE — Progress Notes (Signed)
Order has been put in 

## 2018-04-19 ENCOUNTER — Telehealth: Payer: Self-pay

## 2018-04-19 LAB — PSA: Prostate Specific Ag, Serum: 0.6 ng/mL (ref 0.0–4.0)

## 2018-04-19 NOTE — Telephone Encounter (Signed)
Left detailed message.   

## 2018-04-19 NOTE — Telephone Encounter (Signed)
-----   Message from Abbie Sons, MD sent at 04/19/2018  7:01 AM EDT ----- PSA stable at 0.6

## 2018-05-23 ENCOUNTER — Other Ambulatory Visit: Payer: Self-pay | Admitting: Family Medicine

## 2018-05-23 DIAGNOSIS — E7800 Pure hypercholesterolemia, unspecified: Secondary | ICD-10-CM

## 2018-05-23 MED ORDER — LOVASTATIN 40 MG PO TABS: 40.0000 mg | ORAL_TABLET | Freq: Every day | ORAL | 0 refills | Status: DC

## 2018-05-23 NOTE — Telephone Encounter (Signed)
Pt needs a refill on his   Lovastatin 40 mg  [facility] [address]  [place]

## 2018-09-08 ENCOUNTER — Other Ambulatory Visit: Payer: Self-pay | Admitting: Family Medicine

## 2018-09-08 ENCOUNTER — Telehealth: Payer: Self-pay

## 2018-09-08 DIAGNOSIS — E7800 Pure hypercholesterolemia, unspecified: Secondary | ICD-10-CM

## 2018-09-08 MED ORDER — LOVASTATIN 40 MG PO TABS: 40.0000 mg | ORAL_TABLET | Freq: Every day | ORAL | 0 refills | Status: DC

## 2018-09-08 NOTE — Telephone Encounter (Signed)
Sent to the [facility] for 3 months. Schedule follow up in the next 2-3 months before further refills are needed.

## 2018-09-08 NOTE — Telephone Encounter (Signed)
Patient's wife Richard Warren is requesting a refill on lovastatin (MEVACOR) 40 MG tablet be sent to [place]. She states patient is going to schedule a follow up appointment but has been busy since losing his daughter in October. Patient was going to schedule for Monday but may have jury duty. CB#[phone]

## 2018-09-09 NOTE — Telephone Encounter (Signed)
Patients wife has been advised she states that patient will be in office Tuesday. KW

## 2018-09-12 NOTE — Progress Notes (Signed)
Patient: Richard Warren Male    DOB: 1955-10-28   63 y.o.   MRN: [id number] Visit Date: 09/13/2018  Today's Provider: Vernie Murders, PA   Chief Complaint  Patient presents with  . Follow-up    Hypercholesteremia   Subjective:     HPI  Patient here to follow up.  Lipid/Cholesterol, Follow-up:   Last seen for this1 years ago.  Management changes since that visit include none. . Last Lipid Panel:    Component Value Date/Time   CHOL 186 05/14/2017 1556   CHOL 165 10/26/2016 1041   TRIG 182 (H) 05/14/2017 1556   HDL 46 05/14/2017 1556   HDL 35 (L) 10/26/2016 1041   CHOLHDL 4.0 05/14/2017 1556   LDLCALC 110 (H) 05/14/2017 1556    Risk factors for vascular disease include hypercholesterolemia  He reports excellent compliance with treatment. He is not having side effects.  Current symptoms include none and have been stable. Weight trend: stable Current diet: in general, an "unhealthy" diet Current exercise: none  Wt Readings from Last 3 Encounters:  09/13/18 214 lb 9.6 oz (97.3 kg)  04/18/18 206 lb 9.6 oz (93.7 kg)  05/14/17 216 lb 9.6 oz (98.2 kg)    ------------------------------------------------------------------- Past Medical History:  Diagnosis Date  . Colon polyp   . GERD (gastroesophageal reflux disease)   . Hyperlipidemia   . Kidney stone    Past Surgical History:  Procedure Laterality Date  . APPENDECTOMY  1968?  . COLONOSCOPY  06-28-09   Dr Bary Castilla  . EXTRACORPOREAL SHOCK WAVE LITHOTRIPSY Left 01/07/2017   Procedure: EXTRACORPOREAL SHOCK WAVE LITHOTRIPSY (ESWL);  Surgeon: Nickie Retort, MD;  Location: [facility] OR[facility];  Service: Urology;  Laterality: Left;  . UPPER GI ENDOSCOPY  06-28-09   Dr Bary Castilla  . VASECTOMY     Family History  Problem Relation Age of Onset  . Dementia Mother   . Heart attack Sister   . Hypertension Brother   . Coronary artery disease Sister   . Prostate cancer Neg Hx   . Bladder Cancer Neg Hx   . Kidney  cancer Neg Hx    Allergies  Allergen Reactions  . Zolpidem     Depression    Current Outpatient Medications:  .  B Complex Vitamins (VITAMIN-B COMPLEX PO), Take by mouth., Disp: , Rfl:  .  Cholecalciferol (VITAMIN D-3 PO), Take by mouth., Disp: , Rfl:  .  CINNAMON PO, Take by mouth., Disp: , Rfl:  .  Coenzyme Q10 (CO Q 10 PO), Take by mouth., Disp: , Rfl:  .  Cyanocobalamin (VITAMIN B12 PO), Take by mouth daily., Disp: , Rfl:  .  Esomeprazole Magnesium (NEXIUM PO), Take by mouth., Disp: , Rfl:  .  GARLIC PO, Take by mouth., Disp: , Rfl:  .  Lactobacillus (ACIDOPHILUS PO), Take by mouth., Disp: , Rfl:  .  lovastatin (MEVACOR) 40 MG tablet, Take 1 tablet (40 mg total) by mouth daily with breakfast. Please schedule office visit before future refills, Disp: 90 tablet, Rfl: 0 .  metroNIDAZOLE (METROGEL) 0.75 % gel, , Disp: , Rfl:  .  Milk Thistle 250 MG CAPS, Take by mouth., Disp: , Rfl:  .  Multiple Vitamins-Minerals (MEGA BASIC PO), Take by mouth., Disp: , Rfl:  .  Omega-3 Fatty Acids (OMEGA 3 PO), Take 3,000 mg by mouth 2 (two) times daily., Disp: , Rfl:  .  Selenium 200 MCG CAPS, Take by mouth daily., Disp: , Rfl:  .  vitamin E 400 UNIT capsule, Take 400 Units by mouth daily., Disp: , Rfl:   Review of Systems  Constitutional: Negative.   HENT: Negative.   Eyes: Negative.   Respiratory: Negative.   Cardiovascular: Negative for chest pain, palpitations and leg swelling.  Gastrointestinal: Negative.   Musculoskeletal: Negative.   Skin: Negative.     Social History   Tobacco Use  . Smoking status: Never Smoker  . Smokeless tobacco: Never Used  Substance Use Topics  . Alcohol use: Yes    Alcohol/week: 53.0 standard drinks    Types: 50 Standard drinks or equivalent, 3 Cans of beer per week    Comment: 2 days ago last drink     Objective:   BP 132/60 (BP Location: Right Arm, Patient Position: Sitting, Cuff Size: Large)   Pulse 71   Temp 98.1 F (36.7 C) (Oral)   Resp 16    Wt 214 lb 9.6 oz (97.3 kg)   SpO2 97%   BMI 31.69 kg/m  Vitals:   09/13/18 1456  BP: 132/60  Pulse: 71  Resp: 16  Temp: 98.1 F (36.7 C)  TempSrc: Oral  SpO2: 97%  Weight: 214 lb 9.6 oz (97.3 kg)   Physical Exam Constitutional:      General: He is not in acute distress.    Appearance: He is well-developed.  HENT:     Head: Normocephalic and atraumatic.     Right Ear: Hearing and tympanic membrane normal.     Left Ear: Hearing and tympanic membrane normal.     Nose: Nose normal.     Mouth/Throat:     Pharynx: Oropharynx is clear.  Eyes:     General: Lids are normal. No scleral icterus.       Right eye: No discharge.        Left eye: No discharge.     Conjunctiva/sclera: Conjunctivae normal.  Neck:     Musculoskeletal: Normal range of motion and neck supple.  Cardiovascular:     Rate and Rhythm: Normal rate and regular rhythm.     Pulses: Normal pulses.     Heart sounds: Normal heart sounds.  Pulmonary:     Effort: Pulmonary effort is normal. No respiratory distress.  Abdominal:     General: Bowel sounds are normal.     Palpations: Abdomen is soft.  Musculoskeletal: Normal range of motion.  Skin:    Findings: No lesion or rash.  Neurological:     Mental Status: He is alert and oriented to person, place, and time.  Psychiatric:        Speech: Speech normal.        Behavior: Behavior normal.        Thought Content: Thought content normal.       Assessment & Plan     1. Hypercholesteremia Has gained 8 lbs since August 2019 and continues to take the Lovastatin 40 mg qd with breakfast, Co-Q 10 qd and Omega-3 3000 mg BID. Needs recheck of labs, regular exercise 30-40 minutes 4-5 days a week and low fat diet. Will refill Lovastatin and recheck labs. Follow up pending reports. - CBC with Differential/Platelet - Comprehensive metabolic panel - Lipid panel - TSH - lovastatin (MEVACOR) 40 MG tablet; Take 1 tablet (40 mg total) by mouth daily with breakfast. Please  schedule office visit before future refills  Dispense: 90 tablet; Refill: 3  2. Obesity, Class I, BMI 30-34.9 Weight up over the past 3-4 months. As a truck driver, he has  very little physical exercise each week. Encouraged to lower caloric intake and exercise 30-40 minutes 4-5 days a week. Recheck progress in 3-4 months pending lab reports.     Vernie Murders, PA  [facility]

## 2018-09-13 ENCOUNTER — Ambulatory Visit: Payer: Managed Care, Other (non HMO) | Admitting: Family Medicine

## 2018-09-13 ENCOUNTER — Encounter: Payer: Self-pay | Admitting: Family Medicine

## 2018-09-13 VITALS — BP 132/60 | HR 71 | Temp 98.1°F | Resp 16 | Wt 214.6 lb

## 2018-09-13 DIAGNOSIS — E669 Obesity, unspecified: Secondary | ICD-10-CM | POA: Diagnosis not present

## 2018-09-13 DIAGNOSIS — E7800 Pure hypercholesterolemia, unspecified: Secondary | ICD-10-CM

## 2018-09-13 MED ORDER — LOVASTATIN 40 MG PO TABS: 40.0000 mg | ORAL_TABLET | Freq: Every day | ORAL | 3 refills | Status: DC

## 2018-09-14 LAB — CBC WITH DIFFERENTIAL/PLATELET
BASOS: 1 %
Basophils Absolute: 0 10*3/uL (ref 0.0–0.2)
EOS (ABSOLUTE): 0 10*3/uL (ref 0.0–0.4)
EOS: 1 %
Hematocrit: 44.5 % (ref 37.5–51.0)
Hemoglobin: 15.3 g/dL (ref 13.0–17.7)
IMMATURE GRANS (ABS): 0 10*3/uL (ref 0.0–0.1)
IMMATURE GRANULOCYTES: 0 %
LYMPHS: 28 %
Lymphocytes Absolute: 1.6 10*3/uL (ref 0.7–3.1)
MCH: 31 pg (ref 26.6–33.0)
MCHC: 34.4 g/dL (ref 31.5–35.7)
MCV: 90 fL (ref 79–97)
MONOCYTES: 8 %
Monocytes Absolute: 0.5 10*3/uL (ref 0.1–0.9)
NEUTROS ABS: 3.6 10*3/uL (ref 1.4–7.0)
Neutrophils: 62 %
PLATELETS: 232 10*3/uL (ref 150–450)
RBC: 4.93 x10E6/uL (ref 4.14–5.80)
RDW: 12.8 % (ref 11.6–15.4)
WBC: 5.7 10*3/uL (ref 3.4–10.8)

## 2018-09-14 LAB — COMPREHENSIVE METABOLIC PANEL
A/G RATIO: 1.8 (ref 1.2–2.2)
ALT: 25 IU/L (ref 0–44)
AST: 19 IU/L (ref 0–40)
Albumin: 4.5 g/dL (ref 3.6–4.8)
Alkaline Phosphatase: 45 IU/L (ref 39–117)
BILIRUBIN TOTAL: 0.5 mg/dL (ref 0.0–1.2)
BUN/Creatinine Ratio: 15 (ref 10–24)
BUN: 13 mg/dL (ref 8–27)
CALCIUM: 9.1 mg/dL (ref 8.6–10.2)
CO2: 23 mmol/L (ref 20–29)
Chloride: 104 mmol/L (ref 96–106)
Creatinine, Ser: 0.85 mg/dL (ref 0.76–1.27)
GFR, EST AFRICAN AMERICAN: 109 mL/min/{1.73_m2} (ref >59)
GFR, EST NON AFRICAN AMERICAN: 94 mL/min/{1.73_m2} (ref >59)
GLOBULIN, TOTAL: 2.5 g/dL (ref 1.5–4.5)
Glucose: 95 mg/dL (ref 65–99)
POTASSIUM: 4.1 mmol/L (ref 3.5–5.2)
Sodium: 142 mmol/L (ref 134–144)
Total Protein: 7 g/dL (ref 6.0–8.5)

## 2018-09-14 LAB — LIPID PANEL
CHOL/HDL RATIO: 3.9 ratio (ref 0.0–5.0)
Cholesterol, Total: 179 mg/dL (ref 100–199)
HDL: 46 mg/dL (ref >39)
LDL Calculated: 87 mg/dL (ref 0–99)
Triglycerides: 229 mg/dL — ABNORMAL HIGH (ref 0–149)
VLDL CHOLESTEROL CAL: 46 mg/dL — AB (ref 5–40)

## 2018-09-14 LAB — TSH: TSH: 1.16 u[IU]/mL (ref 0.450–4.500)

## 2018-09-15 ENCOUNTER — Telehealth: Payer: Self-pay

## 2018-09-15 NOTE — Telephone Encounter (Signed)
LMTCB

## 2018-09-15 NOTE — Telephone Encounter (Signed)
-----   Message from Margo Common, [place] sent at 09/15/2018  8:25 AM EST ----- All blood tests are normal except triglycerides higher than a year ago. Weight loss by following a low fat diet, exercising 30-40 minutes 4-5 days a week and losing some weight should get this back on track without additional medications. Should recheck progress in 3 months.

## 2018-09-16 NOTE — Telephone Encounter (Signed)
Advised patient of results.  

## 2018-11-23 ENCOUNTER — Telehealth: Payer: Self-pay

## 2018-11-23 NOTE — Telephone Encounter (Signed)
Tried calling [facility] no answer, will try again later.

## 2018-11-23 NOTE — Telephone Encounter (Signed)
Patients wife called office stating that she had went to [facility] on [address] to pick up patients prescription for Lovastatin and stated that the pharmacist told her that patient had no refills. Looking through chart medication was filled 09/13/18 for 90 tablets with 3 refills. Please contact pharmacy to clarify that they received Rx, patient request that CMA give her a call back once medication has been sent. KW

## 2018-11-29 NOTE — Telephone Encounter (Signed)
Refills are available. Richard Warren was advised.

## 2019-04-21 ENCOUNTER — Ambulatory Visit: Payer: Managed Care, Other (non HMO) | Admitting: Urology

## 2019-06-26 ENCOUNTER — Other Ambulatory Visit: Payer: Self-pay | Admitting: Family Medicine

## 2019-06-26 MED ORDER — METRONIDAZOLE 0.75 % EX GEL: Freq: Two times a day (BID) | CUTANEOUS | 0 refills | Status: DC

## 2019-06-26 NOTE — Telephone Encounter (Signed)
[facility] faxed refill request for the following medications:  metroNIDAZOLE (METROGEL) 0.75 % gel   Please advise.

## 2019-10-01 ENCOUNTER — Other Ambulatory Visit: Payer: Self-pay | Admitting: Family Medicine

## 2019-10-01 DIAGNOSIS — E7800 Pure hypercholesterolemia, unspecified: Secondary | ICD-10-CM

## 2019-10-05 ENCOUNTER — Other Ambulatory Visit: Payer: Self-pay | Admitting: Family Medicine

## 2019-10-05 DIAGNOSIS — E7800 Pure hypercholesterolemia, unspecified: Secondary | ICD-10-CM

## 2019-10-05 NOTE — Telephone Encounter (Signed)
Requested medication (s) are due for refill today:yes  Requested medication (s) are on the active medication list: yes Last refill:  09/13/18  Future visit scheduled: yes 10/23/19  Notes to clinic:  Patient has scheduled appointment. Last request refused. Labs are over due.    Requested Prescriptions  Pending Prescriptions Disp Refills   lovastatin (MEVACOR) 40 MG tablet 90 tablet 3    Sig: Take 1 tablet (40 mg total) by mouth daily with breakfast. Please schedule office visit before future refills      Cardiovascular:  Antilipid - Statins Failed - 10/05/2019  4:59 PM      Failed - Total Cholesterol in normal range and within 360 days    Cholesterol, Total  Date Value Ref Range Status  09/13/2018 179 100 - 199 mg/dL Final          Failed - LDL in normal range and within 360 days    LDL Cholesterol (Calc)  Date Value Ref Range Status  05/14/2017 110 (H) mg/dL (calc) Final    Comment:    Reference range: <100 . Desirable range <100 mg/dL for primary prevention;   <70 mg/dL for patients with CHD or diabetic patients  with > or = 2 CHD risk factors. [place] Kitchen LDL-C is now calculated using the Martin-Hopkins  calculation, which is a validated novel method providing  better accuracy than the Friedewald equation in the  estimation of LDL-C.  Cresenciano Genre et al. Annamaria Helling. [id number]): 2061-2068  (http://education.[employer].com/faq/FAQ164)    LDL Calculated  Date Value Ref Range Status  09/13/2018 87 0 - 99 mg/dL Final          Failed - HDL in normal range and within 360 days    HDL  Date Value Ref Range Status  09/13/2018 46 >39 mg/dL Final          Failed - Triglycerides in normal range and within 360 days    Triglycerides  Date Value Ref Range Status  09/13/2018 229 (H) 0 - 149 mg/dL Final          Failed - Valid encounter within last 12 months    Recent Outpatient Visits           1 year ago [place], Vickki Muff, [place]   2 years ago [place], Vickki Muff, [place]   2 years ago [place], Vickki Muff, [place]   3 years ago [place], Vickki Muff, [place]   3 years ago [place], Vickki Muff, [place]       Future Appointments             In 2 weeks [place], Vickki Muff, [place], [place] - Patient is not pregnant

## 2019-10-05 NOTE — Telephone Encounter (Signed)
Medication Refill - Medication:  lovastatin (MEVACOR) 40 MG tablet  Pt has been scheduled 10/23/2019 please advise   Has the patient contacted their pharmacy? Yes.   (Agent: If no, request that the patient contact the pharmacy for the refill.) (Agent: If yes, when and what did the pharmacy advise?)  Preferred Pharmacy (with phone number or street name):  [facility] [address] [place]  [place] [address]  Phone: [phone] Fax: [phone]     Agent: Please be advised that RX refills may take up to 3 business days. We ask that you follow-up with your pharmacy.

## 2019-10-06 MED ORDER — LOVASTATIN 40 MG PO TABS: 40.0000 mg | ORAL_TABLET | Freq: Every day | ORAL | 0 refills | Status: DC

## 2019-10-16 ENCOUNTER — Other Ambulatory Visit: Payer: Self-pay | Admitting: Urology

## 2019-10-16 DIAGNOSIS — N200 Calculus of kidney: Secondary | ICD-10-CM

## 2019-10-19 NOTE — Progress Notes (Signed)
Richard Warren  MRN: [id number] DOB: 05/30/1956  Subjective:  HPI    Lipid/Cholesterol, Follow-up:   Last seen for this on 09/13/2018. Management changes since that visit include no changes. . Last Lipid Panel:    Component Value Date/Time   CHOL 179 09/13/2018 1620   TRIG 229 (H) 09/13/2018 1620   HDL 46 09/13/2018 1620   CHOLHDL 3.9 09/13/2018 1620   CHOLHDL 4.0 05/14/2017 1556   LDLCALC 87 09/13/2018 1620   LDLCALC 110 (H) 05/14/2017 1556   He reports excellent compliance with treatment. He is not having side effects.  Current symptoms include none  Weight trend: stable   Wt Readings from Last 3 Encounters:  10/23/19 215 lb (97.5 kg)  09/13/18 214 lb 9.6 oz (97.3 kg)  04/18/18 206 lb 9.6 oz (93.7 kg)   ------------------------------------------------------------------- Focus Metrics: Up to date except for Flu Vaccine Health Maintenance: Up to date except for Tetanus Screenings: PHQ2, Fall Risk, Function, Audit C, Exercise  Patient Active Problem List   Diagnosis Date Noted  . Benign prostatic hyperplasia with urinary hesitancy 04/18/2018  . Personal history of kidney stones 04/18/2018  . Insomnia, persistent 04/02/2015  . Dizziness 04/02/2015  . Dysphagia, oral phase 04/02/2015  . Elevated blood-pressure reading without diagnosis of hypertension 04/02/2015  . Hypercholesteremia 04/02/2015  . Calculus of kidney 04/02/2015  . Esophageal reflux 07/20/2014  . History of colonic polyps 07/20/2014  . 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 Cannot sleep 11/11/2009  . Herpes zoster without complication 99991111  . Family history of cardiovascular disease 03/29/2007    Past Medical History:  Diagnosis Date  . Colon polyp   . GERD (gastroesophageal reflux disease)   . Hyperlipidemia   . Kidney stone    Past Surgical History:  Procedure Laterality Date  . APPENDECTOMY  1968?  . COLONOSCOPY  06-28-09   Dr Bary Castilla  .  EXTRACORPOREAL SHOCK WAVE LITHOTRIPSY Left 01/07/2017   Procedure: EXTRACORPOREAL SHOCK WAVE LITHOTRIPSY (ESWL);  Surgeon: Nickie Retort, MD;  Location: [facility] ORS;  Service: Urology;  Laterality: Left;  . UPPER GI ENDOSCOPY  06-28-09   Dr Bary Castilla  . VASECTOMY     Family History  Problem Relation Age of Onset  . Dementia Mother   . Heart attack Sister   . Hypertension Brother   . Coronary artery disease Sister   . Prostate cancer Neg Hx   . Bladder Cancer Neg Hx   . Kidney cancer Neg Hx    Social History   Socioeconomic History  . Marital status: Single    Spouse name: Not on file  . Number of children: Not on file  . Years of education: Not on file  . Highest education level: Not on file  Occupational History  . Not on file  Tobacco Use  . Smoking status: Never Smoker  . Smokeless tobacco: Never Used  Substance and Sexual Activity  . Alcohol use: Yes    Alcohol/week: 53.0 standard drinks    Types: 50 Standard drinks or equivalent, 3 Cans of beer per week    Comment: 2 days ago last drink  . Drug use: No  . Sexual activity: Not on file  Other Topics Concern  . Not on file  Social History Narrative  . Not on file   Social Determinants of Health   Financial Resource Strain:   . Difficulty of Paying Living Expenses: Not on file  Food Insecurity:   . Worried About [employer] of  Food in the Last Year: Not on file  . Ran Out of Food in the Last Year: Not on file  Transportation Needs:   . Lack of Transportation (Medical): Not on file  . Lack of Transportation (Non-Medical): Not on file  Physical Activity:   . Days of Exercise per Week: Not on file  . Minutes of Exercise per Session: Not on file  Stress:   . Feeling of Stress : Not on file  Social Connections:   . Frequency of Communication with Friends and Family: Not on file  . Frequency of Social Gatherings with Friends and Family: Not on file  . Attends Religious Services: Not on file  . Active Member of  Clubs or Organizations: Not on file  . Attends Archivist Meetings: Not on file  . Marital Status: Not on file  Intimate Partner Violence:   . Fear of Current or Ex-Partner: Not on file  . Emotionally Abused: Not on file  . Physically Abused: Not on file  . Sexually Abused: Not on file    Outpatient Encounter Medications as of 10/23/2019  Medication Sig  . B Complex Vitamins (VITAMIN-B COMPLEX PO) Take by mouth.  . Cholecalciferol (VITAMIN D-3 PO) Take by mouth.  Marland Kitchen CINNAMON PO Take by mouth.  . Coenzyme Q10 (CO Q 10 PO) Take by mouth.  . Cyanocobalamin (VITAMIN B12 PO) Take by mouth daily.  Marland Kitchen GARLIC PO Take by mouth.  . Lactobacillus (ACIDOPHILUS PO) Take by mouth.  . lovastatin (MEVACOR) 40 MG tablet Take 1 tablet (40 mg total) by mouth daily with breakfast. Please schedule office visit before future refills  . Milk Thistle 250 MG CAPS Take by mouth.  . Multiple Vitamins-Minerals (MEGA BASIC PO) Take by mouth.  . Omega-3 Fatty Acids (OMEGA 3 PO) Take 3,000 mg by mouth 2 (two) times daily.  . Selenium 200 MCG CAPS Take by mouth daily.  . vitamin E 400 UNIT capsule Take 400 Units by mouth daily.  . Esomeprazole Magnesium (NEXIUM PO) Take by mouth.  . metroNIDAZOLE (METROGEL) 0.75 % gel Apply topically 2 (two) times daily. (Patient not taking: Reported on 10/23/2019)   No facility-administered encounter medications on file as of 10/23/2019.    Allergies  Allergen Reactions  . Zolpidem     Depression    Review of Systems  Constitutional: Negative for chills, fever and malaise/fatigue.  HENT: Negative for congestion and sore throat.   Respiratory: Negative for cough, shortness of breath and wheezing.   Cardiovascular: Negative for chest pain, palpitations, orthopnea, claudication and leg swelling.  Gastrointestinal: Negative for abdominal pain.  Musculoskeletal: Negative for myalgias.  Neurological: Negative for dizziness and headaches.    Objective:  BP (!) 144/92  (BP Location: Right Arm, Patient Position: Sitting, Cuff Size: Normal)   Pulse 81   Temp (!) 96.9 F (36.1 C) (Skin)   Wt 215 lb (97.5 kg)   SpO2 97%   BMI 31.75 kg/m   Vitals:   10/23/19 0918 10/23/19 0923  BP: (!) 144/92 134/90  Pulse: 81   Temp: (!) 96.9 F (36.1 C)   TempSrc: Skin   SpO2: 97%   Weight: 215 lb (97.5 kg)   Body mass index is 31.75 kg/m. BP Readings from Last 3 Encounters:  10/23/19 134/90  09/13/18 132/60  04/18/18 (!) 144/87   Physical Exam  Constitutional: He is oriented to person, place, and time and well-developed, well-nourished, and in no distress.  HENT:  Head: Normocephalic.  Eyes: Conjunctivae  are normal.  Cardiovascular: Normal rate.  Pulmonary/Chest: Effort normal and breath sounds normal.  Abdominal: Soft.  Musculoskeletal:        General: Normal range of motion.     Cervical back: Neck supple.  Neurological: He is alert and oriented to person, place, and time.  Skin: No rash noted.  Psychiatric: Mood, affect and judgment normal.    Assessment and Plan :   1. Hypercholesteremia Tolerating Lovastatin 40 mg qd with breakfast. Difficulty controlling diet driving a truck. Continue Omega-3 3000 mg BID and work on weight loss. Recheck labs and follow up pending reports. - CBC with Differential/Platelet - Comprehensive metabolic panel - Lipid Panel With LDL/HDL Ratio - TSH  2. Body mass index (BMI) of 31.0-31.9 in adult Has gained very little weight in the past several months. Still needs to work on reduced caloric intake and exercise 30-40 minutes 3-4 days a week to reduce weight. Check labs and follow up pending reports. - CBC with Differential/Platelet - Comprehensive metabolic panel - Lipid Panel With LDL/HDL Ratio  3. Calculus of kidney History of stones in the right kidney in 2019 and evaluated by Dr. Bernardo Heater (urologist). Had difficulty getting KUB at that time for follow up. Will get x-ray and labs today. - CBC with  Differential/Platelet - Comprehensive metabolic panel - DG Abd 2 Views  4. Right flank pain Intermittent right flank pain the past month. Most recent episode was a week ago. Denies gross hematuria, nausea or vomiting. Urinalysis normal today without microscopic hematuria or crystals. Check routine labs and KUB to rule out stone. - CBC with Differential/Platelet - Comprehensive metabolic panel - DG Abd 2 Views - POCT urinalysis dipstick

## 2019-10-23 ENCOUNTER — Encounter: Payer: Self-pay | Admitting: Family Medicine

## 2019-10-23 ENCOUNTER — Ambulatory Visit
Admission: RE | Admit: 2019-10-23 | Discharge: 2019-10-23 | Disposition: A | Discharge status: Home / Self Care | Payer: Managed Care, Other (non HMO) | Source: Ambulatory Visit | Attending: Family Medicine | Admitting: Family Medicine

## 2019-10-23 ENCOUNTER — Other Ambulatory Visit: Payer: Self-pay

## 2019-10-23 ENCOUNTER — Ambulatory Visit (INDEPENDENT_AMBULATORY_CARE_PROVIDER_SITE_OTHER): Payer: Managed Care, Other (non HMO) | Admitting: Family Medicine

## 2019-10-23 VITALS — BP 134/90 | HR 81 | Temp 96.9°F | Wt 215.0 lb

## 2019-10-23 DIAGNOSIS — E7800 Pure hypercholesterolemia, unspecified: Secondary | ICD-10-CM | POA: Diagnosis not present

## 2019-10-23 DIAGNOSIS — N200 Calculus of kidney: Secondary | ICD-10-CM | POA: Insufficient documentation

## 2019-10-23 DIAGNOSIS — R109 Unspecified abdominal pain: Secondary | ICD-10-CM | POA: Insufficient documentation

## 2019-10-23 DIAGNOSIS — Z6831 Body mass index (BMI) 31.0-31.9, adult: Secondary | ICD-10-CM

## 2019-10-23 LAB — POCT URINALYSIS DIPSTICK
Bilirubin, UA: NEGATIVE
Blood, UA: NEGATIVE
Glucose, UA: NEGATIVE
Ketones, UA: NEGATIVE
Leukocytes, UA: NEGATIVE
Nitrite, UA: NEGATIVE
Protein, UA: NEGATIVE
Spec Grav, UA: 1.015 (ref 1.010–1.025)
Urobilinogen, UA: 0.2 E.U./dL
pH, UA: 6 (ref 5.0–8.0)

## 2019-10-23 NOTE — Patient Instructions (Signed)
Fat and Cholesterol Restricted Eating Plan Getting too much fat and cholesterol in your diet may cause health problems. Choosing the right foods helps keep your fat and cholesterol at normal levels. This can keep you from getting certain diseases. Your doctor may recommend an eating plan that includes:  Total fat: ______% or less of total calories a day.  Saturated fat: ______% or less of total calories a day.  Cholesterol: less than _________mg a day.  Fiber: ______g a day. What are tips for following this plan? Meal planning  At meals, divide your plate into four equal parts: ? Fill one-half of your plate with vegetables and green salads. ? Fill one-fourth of your plate with whole grains. ? Fill one-fourth of your plate with low-fat (lean) protein foods.  Eat fish that is high in omega-3 fats at least two times a week. This includes mackerel, tuna, sardines, and salmon.  Eat foods that are high in fiber, such as whole grains, beans, apples, broccoli, carrots, peas, and barley. General tips   Work with your doctor to lose weight if you need to.  Avoid: ? Foods with added sugar. ? Fried foods. ? Foods with partially hydrogenated oils.  Limit alcohol intake to no more than 1 drink a day for nonpregnant women and 2 drinks a day for men. One drink equals 12 oz of beer, 5 oz of wine, or 1 oz of hard liquor. Reading food labels  Check food labels for: ? Trans fats. ? Partially hydrogenated oils. ? Saturated fat (g) in each serving. ? Cholesterol (mg) in each serving. ? Fiber (g) in each serving.  Choose foods with healthy fats, such as: ? Monounsaturated fats. ? Polyunsaturated fats. ? Omega-3 fats.  Choose grain products that have whole grains. Look for the word "whole" as the first word in the ingredient list. Cooking  Cook foods using low-fat methods. These include baking, boiling, grilling, and broiling.  Eat more home-cooked foods. Eat at restaurants and buffets  less often.  Avoid cooking using saturated fats, such as butter, cream, palm oil, palm kernel oil, and coconut oil. Recommended foods  Fruits  All fresh, canned (in natural juice), or frozen fruits. Vegetables  Fresh or frozen vegetables (raw, steamed, roasted, or grilled). Green salads. Grains  Whole grains, such as whole wheat or whole grain breads, crackers, cereals, and pasta. Unsweetened oatmeal, bulgur, barley, quinoa, or brown rice. Corn or whole wheat flour tortillas. Meats and other protein foods  Ground beef (85% or leaner), grass-fed beef, or beef trimmed of fat. Skinless chicken or turkey. Ground chicken or turkey. Pork trimmed of fat. All fish and seafood. Egg whites. Dried beans, peas, or lentils. Unsalted nuts or seeds. Unsalted canned beans. Nut butters without added sugar or oil. Dairy  Low-fat or nonfat dairy products, such as skim or 1% milk, 2% or reduced-fat cheeses, low-fat and fat-free ricotta or cottage cheese, or plain low-fat and nonfat yogurt. Fats and oils  Tub margarine without trans fats. Light or reduced-fat mayonnaise and salad dressings. Avocado. Olive, canola, sesame, or safflower oils. The items listed above may not be a complete list of foods and beverages you can eat. Contact a dietitian for more information. Foods to avoid Fruits  Canned fruit in heavy syrup. Fruit in cream or butter sauce. Fried fruit. Vegetables  Vegetables cooked in cheese, cream, or butter sauce. Fried vegetables. Grains  White bread. White pasta. White rice. Cornbread. Bagels, pastries, and croissants. Crackers and snack foods that contain trans fat   and hydrogenated oils. Meats and other protein foods  Fatty cuts of meat. Ribs, chicken wings, bacon, sausage, bologna, salami, chitterlings, fatback, hot dogs, bratwurst, and packaged lunch meats. Liver and organ meats. Whole eggs and egg yolks. Chicken and turkey with skin. Fried meat. Dairy  Whole or 2% milk, cream,  half-and-half, and cream cheese. Whole milk cheeses. Whole-fat or sweetened yogurt. Full-fat cheeses. Nondairy creamers and whipped toppings. Processed cheese, cheese spreads, and cheese curds. Beverages  Alcohol. Sugar-sweetened drinks such as sodas, lemonade, and fruit drinks. Fats and oils  Butter, stick margarine, lard, shortening, ghee, or bacon fat. Coconut, palm kernel, and palm oils. Sweets and desserts  Corn syrup, sugars, honey, and molasses. Candy. Jam and jelly. Syrup. Sweetened cereals. Cookies, pies, cakes, donuts, muffins, and ice cream. The items listed above may not be a complete list of foods and beverages you should avoid. Contact a dietitian for more information. Summary  Choosing the right foods helps keep your fat and cholesterol at normal levels. This can keep you from getting certain diseases.  At meals, fill one-half of your plate with vegetables and green salads.  Eat high-fiber foods, like whole grains, beans, apples, carrots, peas, and barley.  Limit added sugar, saturated fats, alcohol, and fried foods. This information is not intended to replace advice given to you by your health care provider. Make sure you discuss any questions you have with your health care provider. Document Revised: 04/27/2018 Document Reviewed: 05/11/2017 Elsevier Patient Education  2020 Elsevier Inc.  

## 2019-10-24 LAB — COMPREHENSIVE METABOLIC PANEL
ALT: 29 IU/L (ref 0–44)
AST: 20 IU/L (ref 0–40)
Albumin/Globulin Ratio: 1.8 (ref 1.2–2.2)
Albumin: 4.2 g/dL (ref 3.8–4.8)
Alkaline Phosphatase: 48 IU/L (ref 39–117)
BUN/Creatinine Ratio: 16 (ref 10–24)
BUN: 13 mg/dL (ref 8–27)
Bilirubin Total: 0.7 mg/dL (ref 0.0–1.2)
CO2: 23 mmol/L (ref 20–29)
Calcium: 8.9 mg/dL (ref 8.6–10.2)
Chloride: 104 mmol/L (ref 96–106)
Creatinine, Ser: 0.83 mg/dL (ref 0.76–1.27)
GFR calc Af Amer: 109 mL/min/{1.73_m2} (ref >59)
GFR calc non Af Amer: 94 mL/min/{1.73_m2} (ref >59)
Globulin, Total: 2.4 g/dL (ref 1.5–4.5)
Glucose: 99 mg/dL (ref 65–99)
Potassium: 4.2 mmol/L (ref 3.5–5.2)
Sodium: 141 mmol/L (ref 134–144)
Total Protein: 6.6 g/dL (ref 6.0–8.5)

## 2019-10-24 LAB — CBC WITH DIFFERENTIAL/PLATELET
Basophils Absolute: 0 10*3/uL (ref 0.0–0.2)
Basos: 1 %
EOS (ABSOLUTE): 0.1 10*3/uL (ref 0.0–0.4)
Eos: 1 %
Hematocrit: 44.7 % (ref 37.5–51.0)
Hemoglobin: 15.3 g/dL (ref 13.0–17.7)
Immature Grans (Abs): 0 10*3/uL (ref 0.0–0.1)
Immature Granulocytes: 0 %
Lymphocytes Absolute: 1.2 10*3/uL (ref 0.7–3.1)
Lymphs: 33 %
MCH: 32.3 pg (ref 26.6–33.0)
MCHC: 34.2 g/dL (ref 31.5–35.7)
MCV: 95 fL (ref 79–97)
Monocytes Absolute: 0.4 10*3/uL (ref 0.1–0.9)
Monocytes: 10 %
Neutrophils Absolute: 1.9 10*3/uL (ref 1.4–7.0)
Neutrophils: 55 %
Platelets: 231 10*3/uL (ref 150–450)
RBC: 4.73 x10E6/uL (ref 4.14–5.80)
RDW: 12.3 % (ref 11.6–15.4)
WBC: 3.6 10*3/uL (ref 3.4–10.8)

## 2019-10-24 LAB — LIPID PANEL WITH LDL/HDL RATIO
Cholesterol, Total: 165 mg/dL (ref 100–199)
HDL: 42 mg/dL (ref >39)
LDL Chol Calc (NIH): 95 mg/dL (ref 0–99)
LDL/HDL Ratio: 2.3 ratio (ref 0.0–3.6)
Triglycerides: 163 mg/dL — ABNORMAL HIGH (ref 0–149)
VLDL Cholesterol Cal: 28 mg/dL (ref 5–40)

## 2019-10-24 LAB — TSH: TSH: 1.76 u[IU]/mL (ref 0.450–4.500)

## 2019-12-05 ENCOUNTER — Encounter: Payer: Self-pay | Admitting: Family Medicine

## 2019-12-05 ENCOUNTER — Ambulatory Visit (INDEPENDENT_AMBULATORY_CARE_PROVIDER_SITE_OTHER): Payer: Managed Care, Other (non HMO) | Admitting: Family Medicine

## 2019-12-05 ENCOUNTER — Other Ambulatory Visit: Payer: Self-pay

## 2019-12-05 VITALS — Temp 97.8°F

## 2019-12-05 DIAGNOSIS — R509 Fever, unspecified: Secondary | ICD-10-CM | POA: Diagnosis not present

## 2019-12-05 MED ORDER — AZITHROMYCIN 250 MG PO TABS: ORAL_TABLET | ORAL | 0 refills | Status: DC

## 2019-12-05 MED ORDER — PREDNISONE 5 MG PO TABS: ORAL_TABLET | ORAL | 0 refills | Status: DC

## 2019-12-05 NOTE — Progress Notes (Signed)
Virtual telephone visit      Virtual Visit via Telephone Note   This visit type was conducted due to national recommendations for restrictions regarding the COVID-19 Pandemic (e.g. social distancing) in an effort to limit this patient's exposure and mitigate transmission in our community. Due to his co-morbid illnesses, this patient is at least at moderate risk for complications without adequate follow up. This format is felt to be most appropriate for this patient at this time. The patient did not have access to video technology or had technical difficulties with video requiring transitioning to audio format only (telephone). Physical exam was limited to content and character of the telephone converstion.    Patient location: Home Provider location: Office    Patient: Richard Warren   DOB: 1955-12-21   64 y.o. Male  MRN: [id number] Visit Date: 12/05/2019  Today's Provider: Vernie Murders, PA  Subjective:    Chief Complaint  Patient presents with  . Fever   Fever  This is a new problem. The current episode started in the past 7 days. The problem has been resolved. Associated symptoms include coughing, ear pain, headaches, muscle aches and sleepiness. Pertinent negatives include no abdominal pain, chest pain, congestion, diarrhea, nausea, rash, sore throat, urinary pain, vomiting or wheezing. Treatments tried: Nyquil. The treatment provided moderate relief.    Patient Active Problem List   Diagnosis Date Noted  . Benign prostatic hyperplasia with urinary hesitancy 04/18/2018  . Personal history of kidney stones 04/18/2018  . Insomnia, persistent 04/02/2015  . Dizziness 04/02/2015  . Dysphagia, oral phase 04/02/2015  . Elevated blood-pressure reading without diagnosis of hypertension 04/02/2015  . Hypercholesteremia 04/02/2015  . Calculus of kidney 04/02/2015  . Esophageal reflux 07/20/2014  . History of colonic polyps 07/20/2014  . Combined fat and carbohydrate induced  hyperlipemia 11/11/2009  . Cannot sleep 11/11/2009  . Herpes zoster without complication 99991111  . Family history of cardiovascular disease 03/29/2007   Past Surgical History:  Procedure Laterality Date  . APPENDECTOMY  1968?  . COLONOSCOPY  06-28-09   Dr Bary Castilla  . EXTRACORPOREAL SHOCK WAVE LITHOTRIPSY Left 01/07/2017   Procedure: EXTRACORPOREAL SHOCK WAVE LITHOTRIPSY (ESWL);  Surgeon: Nickie Retort, MD;  Location: [facility] ORS;  Service: Urology;  Laterality: Left;  . UPPER GI ENDOSCOPY  06-28-09   Dr Bary Castilla  . VASECTOMY     Allergies  Allergen Reactions  . Zolpidem     Depression      Medications: Outpatient Medications Prior to Visit  Medication Sig  . lovastatin (MEVACOR) 40 MG tablet Take 1 tablet (40 mg total) by mouth daily with breakfast. Please schedule office visit before future refills  . B Complex Vitamins (VITAMIN-B COMPLEX PO) Take by mouth.  . Cholecalciferol (VITAMIN D-3 PO) Take by mouth.  Marland Kitchen CINNAMON PO Take by mouth.  . Coenzyme Q10 (CO Q 10 PO) Take by mouth.  . Cyanocobalamin (VITAMIN B12 PO) Take by mouth daily.  . Esomeprazole Magnesium (NEXIUM PO) Take by mouth.  Marland Kitchen GARLIC PO Take by mouth.  . Lactobacillus (ACIDOPHILUS PO) Take by mouth.  . metroNIDAZOLE (METROGEL) 0.75 % gel Apply topically 2 (two) times daily. (Patient not taking: Reported on 10/23/2019)  . Milk Thistle 250 MG CAPS Take by mouth.  . Multiple Vitamins-Minerals (MEGA BASIC PO) Take by mouth.  . Omega-3 Fatty Acids (OMEGA 3 PO) Take 3,000 mg by mouth 2 (two) times daily.  . Selenium 200 MCG CAPS Take by mouth daily.  . vitamin E  400 UNIT capsule Take 400 Units by mouth daily.   No facility-administered medications prior to visit.    Review of Systems  Constitutional: Positive for appetite change and fever.  HENT: Positive for ear pain. Negative for congestion, postnasal drip and sore throat.   Respiratory: Positive for cough. Negative for wheezing.   Cardiovascular: Negative  for chest pain.  Gastrointestinal: Negative for abdominal pain, diarrhea, nausea and vomiting.  Genitourinary: Negative for dysuria.  Skin: Negative for rash.  Neurological: Positive for headaches.        Objective:    Temp 97.8 F (36.6 C)    Physical: No acute respiratory distress or cough during telephonic interview.     Assessment & Plan:    1. Fever, unspecified fever cause Developed and headache with slight cough Saturday 12-02-19. The next day he had a fever of 100.2 with feeling tired in the evening. Took Nyquil and symptoms have improved. No further fever, sore throat, PND, sputum production or loss of sense of taste. Appetite has been diminished but no nausea, vomiting or diarrhea. Temp only 97.8 today. Does not want to go to the health department for COVID testing because of requirements for prolonged isolation back in January that cost him a large portion of his income. Encouraged to consider going to a walk-in clinic to test if any fever or worsening of symptoms. Will treat with Z-pak, Prednisone taper and may use Nyquil prn. Increase fluid intake and stay home for 3 days until sure he does not have COVID. - azithromycin (ZITHROMAX) 250 MG tablet; Take 2 tablets by mouth today then 1 daily for 4 days.  Dispense: 6 tablet; Refill: 0 - predniSONE (DELTASONE) 5 MG tablet; Taper down tablets by mouth starting at 6 day 1, 5 day 2, 4, day 3, 3 day 4, 2 day 5 and 1 day 6. Divide doses to each meal and bedtime per day.  Dispense: 21 tablet; Refill: 0     I discussed the assessment and treatment plan with the patient. The patient was provided an opportunity to ask questions and all were answered. The patient agreed with the plan and demonstrated an understanding of the instructions.   The patient was advised to call back or seek an in-person evaluation if the symptoms worsen or if the condition fails to improve as anticipated.  I provided 15 minutes of non-face-to-face time during  this encounter.   Vernie Murders, [place] [phone] (phone) [phone] (fax)  [place]

## 2019-12-06 ENCOUNTER — Other Ambulatory Visit
Admission: RE | Admit: 2019-12-06 | Discharge: 2019-12-06 | Disposition: A | Discharge status: Home / Self Care | Payer: Managed Care, Other (non HMO) | Source: Ambulatory Visit | Attending: Family Medicine | Admitting: Family Medicine

## 2019-12-06 DIAGNOSIS — R0600 Dyspnea, unspecified: Secondary | ICD-10-CM | POA: Diagnosis not present

## 2019-12-06 DIAGNOSIS — M79605 Pain in left leg: Secondary | ICD-10-CM | POA: Diagnosis present

## 2019-12-06 DIAGNOSIS — M79604 Pain in right leg: Secondary | ICD-10-CM | POA: Insufficient documentation

## 2019-12-06 LAB — FIBRIN DERIVATIVES D-DIMER (ARMC ONLY): Fibrin derivatives D-dimer (ARMC): 225.32 ng/mL (FEU) (ref 0.00–499.00)

## 2020-02-01 ENCOUNTER — Other Ambulatory Visit: Payer: Self-pay | Admitting: Family Medicine

## 2020-02-01 DIAGNOSIS — E7800 Pure hypercholesterolemia, unspecified: Secondary | ICD-10-CM

## 2020-06-09 ENCOUNTER — Other Ambulatory Visit: Payer: Self-pay | Admitting: Family Medicine

## 2020-06-09 DIAGNOSIS — E7800 Pure hypercholesterolemia, unspecified: Secondary | ICD-10-CM

## 2020-06-09 NOTE — Telephone Encounter (Signed)
Requested Prescriptions  Pending Prescriptions Disp Refills   lovastatin (MEVACOR) 40 MG tablet [Pharmacy Med Name: Lovastatin 40 MG Oral Tablet] 90 tablet 0    Sig: TAKE 1 TABLET BY MOUTH ONCE DAILY WITH BREAKFAST. PLEASE SCHEDULE OFFICE VISIT BEFORE FUTURE REFILLS     Cardiovascular:  Antilipid - Statins Failed - 06/09/2020 11:14 AM      Failed - LDL in normal range and within 360 days    LDL Cholesterol (Calc)  Date Value Ref Range Status  05/14/2017 110 (H) mg/dL (calc) Final    Comment:    Reference range: <100 . Desirable range <100 mg/dL for primary prevention;   <70 mg/dL for patients with CHD or diabetic patients  with > or = 2 CHD risk factors. Marland Kitchen LDL-C is now calculated using the Martin-Hopkins  calculation, which is a validated novel method providing  better accuracy than the Friedewald equation in the  estimation of LDL-C.  Cresenciano Genre et al. Annamaria Helling. 5284;132(44): 2061-2068  (http://education.[facility].com/faq/FAQ164)    LDL Chol Calc (NIH)  Date Value Ref Range Status  10/23/2019 95 0 - 99 mg/dL Final         Failed - Triglycerides in normal range and within 360 days    Triglycerides  Date Value Ref Range Status  10/23/2019 163 (H) 0 - 149 mg/dL Final         Passed - Total Cholesterol in normal range and within 360 days    Cholesterol, Total  Date Value Ref Range Status  10/23/2019 165 100 - 199 mg/dL Final         Passed - HDL in normal range and within 360 days    HDL  Date Value Ref Range Status  10/23/2019 42 >39 mg/dL Final         Passed - Patient is not pregnant      Passed - Valid encounter within last 12 months    Recent Outpatient Visits          6 months ago Fever, unspecified fever cause   [facility], [place], [place]   7 months ago [facility], [place], [place]   1 year ago [facility], [place], [place]   3 years ago [facility], [place]uff, [place]   3 years ago [facility], [place], [place]

## 2020-09-20 ENCOUNTER — Other Ambulatory Visit: Payer: Managed Care, Other (non HMO)

## 2020-10-06 IMAGING — CR DG ABDOMEN 2V
1 series · 3 of 3 positions shown · non-contrast
Comparison: 01/21/2017

CLINICAL DATA: Renal calculus with right flank pain

EXAM:
ABDOMEN - 2 VIEW

[Series 1: dg abd 2 views · 0.14mm/px · 3 of 3 slices shown]
[im 1/3]
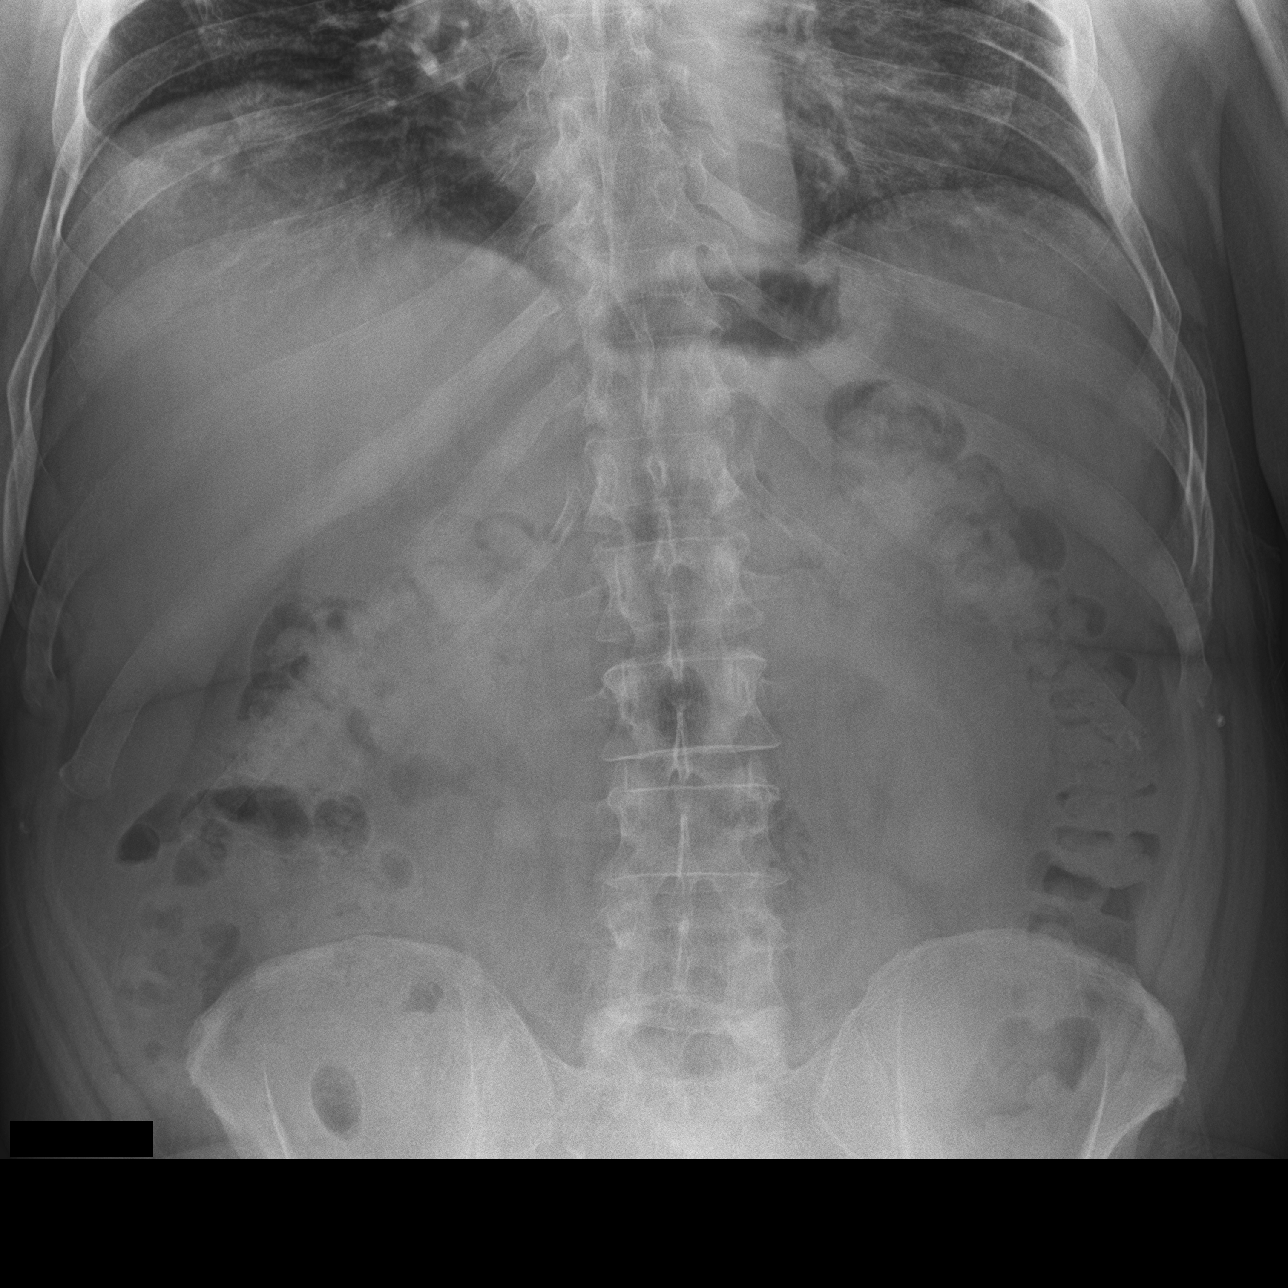
[im 2/3]
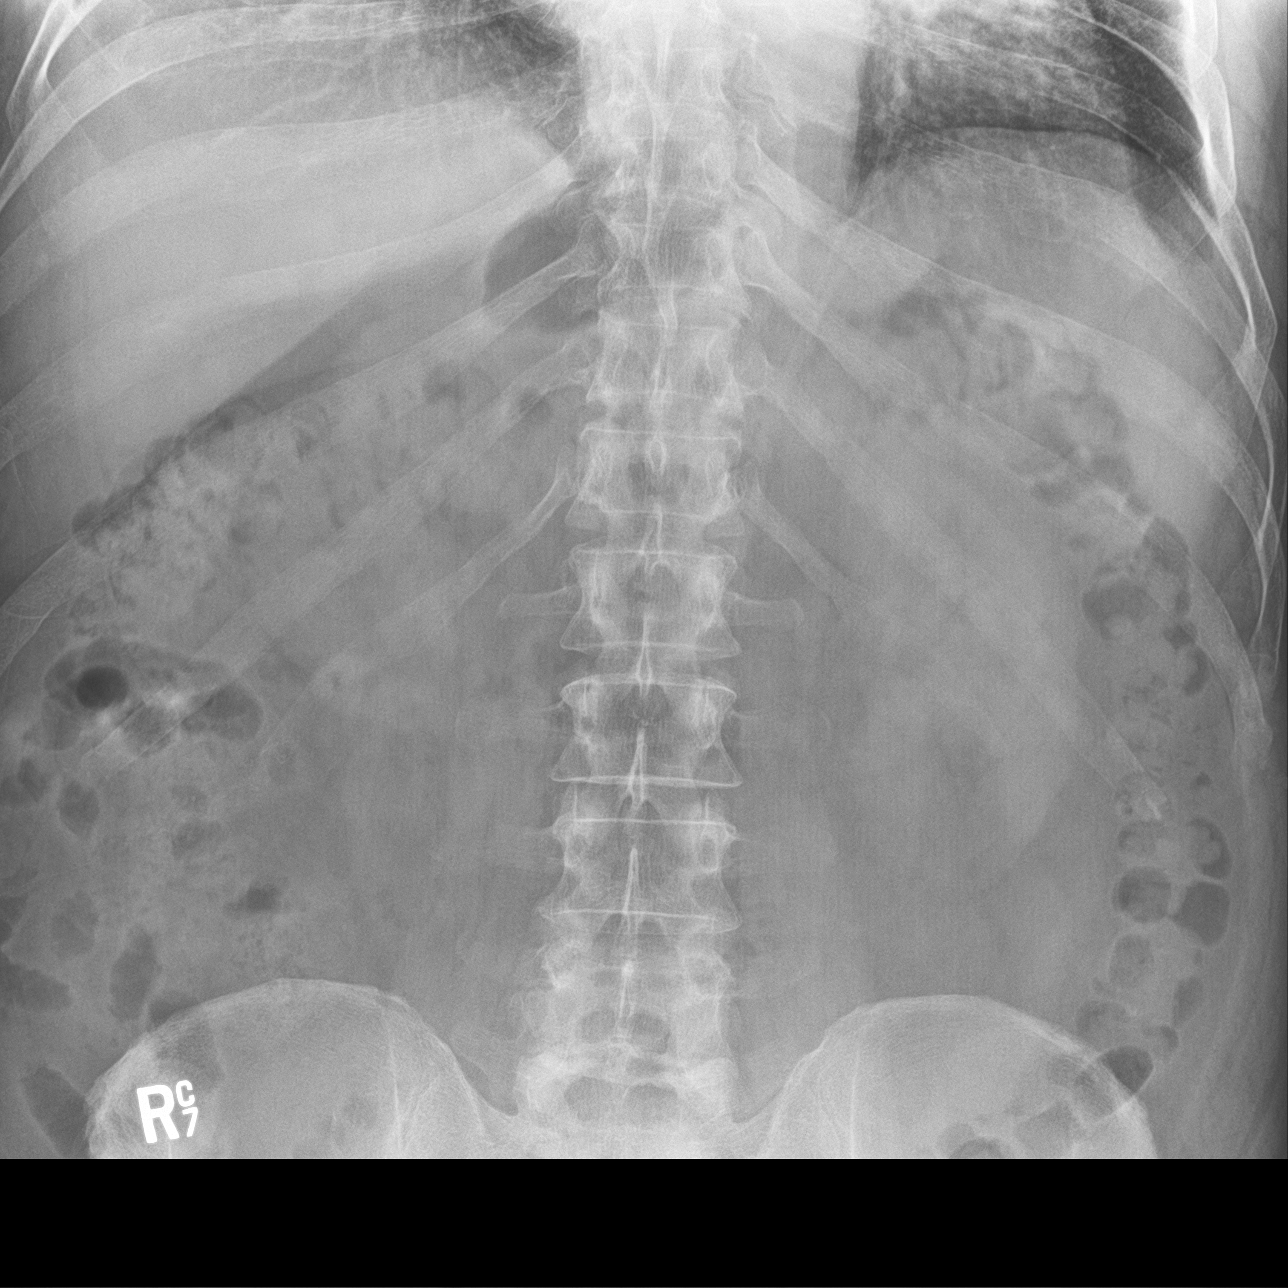
[im 3/3]
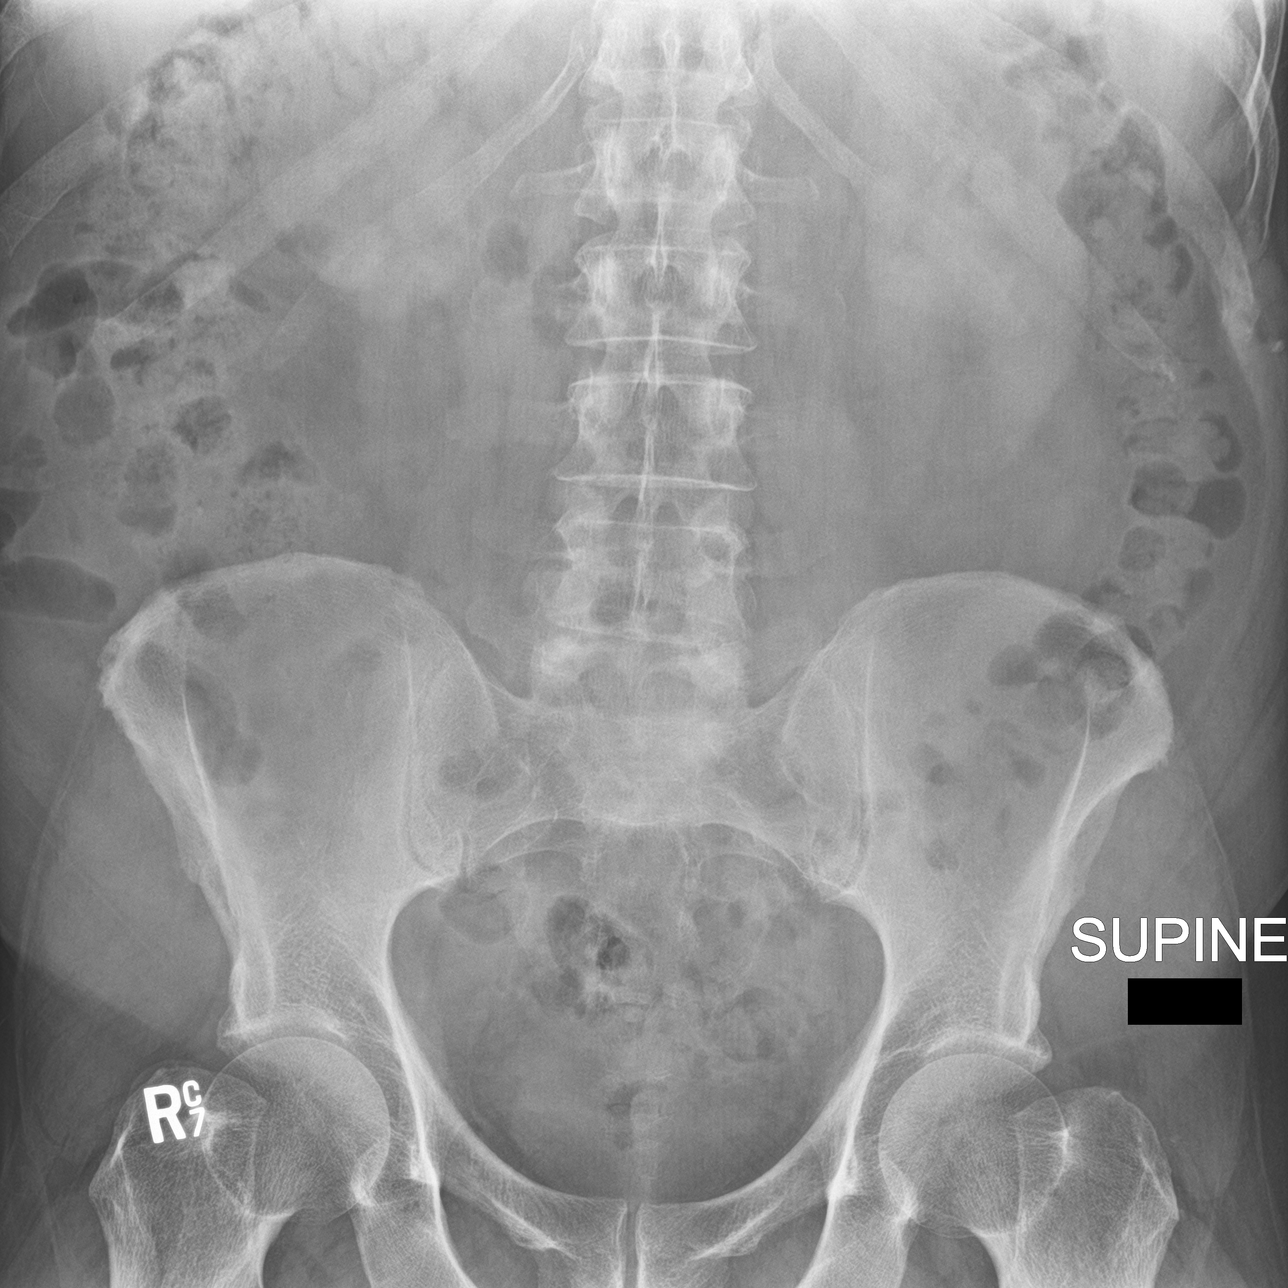

[3 of 3 positions shown; findings below may reference images not displayed]

FINDINGS: The bowel gas pattern is normal. There is no evidence of free air.
No radio-opaque calculi or other significant radiographic
abnormality is seen.
IMPRESSION: Negative.

## 2022-03-02 ENCOUNTER — Ambulatory Visit (INDEPENDENT_AMBULATORY_CARE_PROVIDER_SITE_OTHER): Payer: Managed Care, Other (non HMO) | Admitting: Family Medicine

## 2022-03-02 ENCOUNTER — Ambulatory Visit: Payer: Self-pay | Admitting: *Deleted

## 2022-03-02 ENCOUNTER — Other Ambulatory Visit: Payer: Self-pay

## 2022-03-02 ENCOUNTER — Encounter: Payer: Self-pay | Admitting: Emergency Medicine

## 2022-03-02 ENCOUNTER — Emergency Department: Payer: Managed Care, Other (non HMO)

## 2022-03-02 ENCOUNTER — Encounter: Payer: Self-pay | Admitting: Family Medicine

## 2022-03-02 ENCOUNTER — Inpatient Hospital Stay
Admission: EM | Admit: 2022-03-02 | Discharge: 2022-03-05 | DRG: 247 | Disposition: A | Discharge status: Home / Self Care | Payer: Managed Care, Other (non HMO) | Attending: Internal Medicine | Admitting: Internal Medicine

## 2022-03-02 VITALS — BP 160/95 | HR 86 | Temp 98.5°F

## 2022-03-02 DIAGNOSIS — E7800 Pure hypercholesterolemia, unspecified: Secondary | ICD-10-CM

## 2022-03-02 DIAGNOSIS — R072 Precordial pain: Secondary | ICD-10-CM

## 2022-03-02 DIAGNOSIS — R3911 Hesitancy of micturition: Secondary | ICD-10-CM | POA: Diagnosis present

## 2022-03-02 DIAGNOSIS — I455 Other specified heart block: Secondary | ICD-10-CM

## 2022-03-02 DIAGNOSIS — S3012XA Contusion of groin, initial encounter: Secondary | ICD-10-CM

## 2022-03-02 DIAGNOSIS — I2510 Atherosclerotic heart disease of native coronary artery without angina pectoris: Secondary | ICD-10-CM | POA: Diagnosis present

## 2022-03-02 DIAGNOSIS — Z87442 Personal history of urinary calculi: Secondary | ICD-10-CM | POA: Diagnosis not present

## 2022-03-02 DIAGNOSIS — M549 Dorsalgia, unspecified: Secondary | ICD-10-CM | POA: Diagnosis present

## 2022-03-02 DIAGNOSIS — R030 Elevated blood-pressure reading, without diagnosis of hypertension: Secondary | ICD-10-CM | POA: Diagnosis not present

## 2022-03-02 DIAGNOSIS — Z79899 Other long term (current) drug therapy: Secondary | ICD-10-CM

## 2022-03-02 DIAGNOSIS — I214 Non-ST elevation (NSTEMI) myocardial infarction: Secondary | ICD-10-CM | POA: Diagnosis not present

## 2022-03-02 DIAGNOSIS — K219 Gastro-esophageal reflux disease without esophagitis: Secondary | ICD-10-CM | POA: Diagnosis present

## 2022-03-02 DIAGNOSIS — R079 Chest pain, unspecified: Secondary | ICD-10-CM

## 2022-03-02 DIAGNOSIS — Z86010 Personal history of colonic polyps: Secondary | ICD-10-CM

## 2022-03-02 DIAGNOSIS — I249 Acute ischemic heart disease, unspecified: Secondary | ICD-10-CM

## 2022-03-02 DIAGNOSIS — Y840 Cardiac catheterization as the cause of abnormal reaction of the patient, or of later complication, without mention of misadventure at the time of the procedure: Secondary | ICD-10-CM | POA: Diagnosis not present

## 2022-03-02 DIAGNOSIS — G8929 Other chronic pain: Secondary | ICD-10-CM | POA: Diagnosis present

## 2022-03-02 DIAGNOSIS — S301XXA Contusion of abdominal wall, initial encounter: Secondary | ICD-10-CM

## 2022-03-02 DIAGNOSIS — Z8249 Family history of ischemic heart disease and other diseases of the circulatory system: Secondary | ICD-10-CM

## 2022-03-02 DIAGNOSIS — R001 Bradycardia, unspecified: Secondary | ICD-10-CM | POA: Diagnosis not present

## 2022-03-02 DIAGNOSIS — R55 Syncope and collapse: Secondary | ICD-10-CM | POA: Diagnosis not present

## 2022-03-02 DIAGNOSIS — I729 Aneurysm of unspecified site: Secondary | ICD-10-CM

## 2022-03-02 DIAGNOSIS — N401 Enlarged prostate with lower urinary tract symptoms: Secondary | ICD-10-CM | POA: Diagnosis present

## 2022-03-02 DIAGNOSIS — Z888 Allergy status to other drugs, medicaments and biological substances status: Secondary | ICD-10-CM

## 2022-03-02 HISTORY — DX: Acute ischemic heart disease, unspecified: I24.9

## 2022-03-02 LAB — CBC
HCT: 45.4 % (ref 39.0–52.0)
Hemoglobin: 15.3 g/dL (ref 13.0–17.0)
MCH: 31 pg (ref 26.0–34.0)
MCHC: 33.7 g/dL (ref 30.0–36.0)
MCV: 92.1 fL (ref 80.0–100.0)
Platelets: 241 10*3/uL (ref 150–400)
RBC: 4.93 MIL/uL (ref 4.22–5.81)
RDW: 12.6 % (ref 11.5–15.5)
WBC: 5.1 10*3/uL (ref 4.0–10.5)
nRBC: 0 % (ref 0.0–0.2)

## 2022-03-02 LAB — BASIC METABOLIC PANEL
Anion gap: 9 (ref 5–15)
BUN: 11 mg/dL (ref 8–23)
CO2: 22 mmol/L (ref 22–32)
Calcium: 9 mg/dL (ref 8.9–10.3)
Chloride: 110 mmol/L (ref 98–111)
Creatinine, Ser: 0.68 mg/dL (ref 0.61–1.24)
GFR, Estimated: >60 mL/min (ref >60)
Glucose, Bld: 118 mg/dL — ABNORMAL HIGH (ref 70–99)
Potassium: 3.8 mmol/L (ref 3.5–5.1)
Sodium: 141 mmol/L (ref 135–145)

## 2022-03-02 LAB — TROPONIN I (HIGH SENSITIVITY)
Troponin I (High Sensitivity): 39 ng/L — ABNORMAL HIGH (ref <18)
Troponin I (High Sensitivity): 48 ng/L — ABNORMAL HIGH (ref <18)
Troponin I (High Sensitivity): 903 ng/L (ref <18)
Troponin I (High Sensitivity): 2454 ng/L (ref <18)

## 2022-03-02 MED ORDER — ONDANSETRON HCL 4 MG/2ML IJ SOLN: 4.0000 mg | Freq: Four times a day (QID) | INTRAMUSCULAR | Status: DC | PRN

## 2022-03-02 MED ORDER — PANTOPRAZOLE SODIUM 40 MG PO TBEC
40.0000 mg | DELAYED_RELEASE_TABLET | Freq: Every day | ORAL | Status: DC
Start: 2022-03-02 — End: 2022-03-05
  Administered 2022-03-02 – 2022-03-05 (×4): 40 mg via ORAL
  Filled 2022-03-02 (×4): qty 1

## 2022-03-02 MED ORDER — ALUM & MAG HYDROXIDE-SIMETH 200-200-20 MG/5ML PO SUSP
30.0000 mL | ORAL | Status: DC | PRN
Start: 2022-03-02 — End: 2022-03-05

## 2022-03-02 MED ORDER — HEPARIN BOLUS VIA INFUSION
2000.0000 [IU] | Freq: Once | INTRAVENOUS | Status: AC
Start: 2022-03-02 — End: 2022-03-02
  Administered 2022-03-02: 2000 [IU] via INTRAVENOUS
  Filled 2022-03-02: qty 2000

## 2022-03-02 MED ORDER — MELATONIN 5 MG PO TABS
5.0000 mg | ORAL_TABLET | Freq: Every evening | ORAL | Status: DC | PRN
  Administered 2022-03-02 – 2022-03-03 (×2): 5 mg via ORAL
  Filled 2022-03-02 (×2): qty 1

## 2022-03-02 MED ORDER — SODIUM CHLORIDE 0.9 % IV SOLN: INTRAVENOUS | Status: DC

## 2022-03-02 MED ORDER — ENOXAPARIN SODIUM 40 MG/0.4ML IJ SOSY: 40.0000 mg | PREFILLED_SYRINGE | INTRAMUSCULAR | Status: DC

## 2022-03-02 MED ORDER — PRAVASTATIN SODIUM 40 MG PO TABS
40.0000 mg | ORAL_TABLET | Freq: Every day | ORAL | Status: DC
  Administered 2022-03-02: 40 mg via ORAL
  Filled 2022-03-02: qty 1

## 2022-03-02 MED ORDER — ENOXAPARIN SODIUM 60 MG/0.6ML IJ SOSY
0.5000 mg/kg | PREFILLED_SYRINGE | INTRAMUSCULAR | Status: DC
  Administered 2022-03-02: 47.5 mg via SUBCUTANEOUS
  Filled 2022-03-02: qty 0.6

## 2022-03-02 MED ORDER — ACETAMINOPHEN 325 MG PO TABS
650.0000 mg | ORAL_TABLET | ORAL | Status: DC | PRN
  Administered 2022-03-03: 325 mg via ORAL

## 2022-03-02 MED ORDER — HEPARIN (PORCINE) 25000 UT/250ML-% IV SOLN
1250.0000 [IU]/h | INTRAVENOUS | Status: DC
  Administered 2022-03-02: 1250 [IU]/h via INTRAVENOUS
  Filled 2022-03-02: qty 250

## 2022-03-02 MED ORDER — RISAQUAD PO CAPS
2.0000 | ORAL_CAPSULE | Freq: Every day | ORAL | Status: DC
  Administered 2022-03-02 – 2022-03-05 (×4): 2 via ORAL
  Filled 2022-03-02 (×4): qty 2

## 2022-03-02 NOTE — ED Provider Notes (Signed)
[facility] Provider Note    Event Date/Time   First MD Initiated Contact with Patient 03/02/22 1332     (approximate)   History   Chief Complaint Chest Pain   HPI  Richard Warren is a 66 y.o. male with past medical history of hyperlipidemia and GERD who presents to the ED complaining of chest pain.  Patient reports that since 8:00 this morning he has been dealing with waxing and waning pain in the center of his chest.  He describes it as a dull ache that is not exacerbated or alleviated by anything in particular.  He has not had any associated fevers, cough, or shortness of breath.  He was initially evaluated in his PCPs office, where they were concerned about EKG changes and called EMS for possible STEMI.  He was given 1 nitroglycerin and 1 baby aspirin by his PCP, states the nitroglycerin helps with the pain but caused him to feel very lightheaded and he eventually passed out.  EMS was called to his doctor's office and gave him 3 additional baby aspirin.  He states that the pain improved but now seems to be coming back.  He denies any significant cardiac history.     Physical Exam   Triage Vital Signs: ED Triage Vitals [03/02/22 1219]  Enc Vitals Group     BP 120/71     Pulse Rate 75     Resp 16     Temp 98.2 F (36.8 C)     Temp Source Oral     SpO2 96 %     Weight 210 lb (95.3 kg)     Height 5\' 9"  (1.753 m)     Head Circumference      Peak Flow      Pain Score 0     Pain Loc      Pain Edu?      Excl. in GC?     Most recent vital signs: Vitals:   03/02/22 1219  BP: 120/71  Pulse: 75  Resp: 16  Temp: 98.2 F (36.8 C)  SpO2: 96%    Constitutional: Alert and oriented. Eyes: Conjunctivae are normal. Head: Atraumatic. Nose: No congestion/rhinnorhea. Mouth/Throat: Mucous membranes are moist.  Cardiovascular: Normal rate, regular rhythm. Grossly normal heart sounds.  2+ radial pulses bilaterally. Respiratory: Normal respiratory  effort.  No retractions. Lungs CTAB.  No chest wall tenderness to palpation. Gastrointestinal: Soft and nontender. No distention. Musculoskeletal: No lower extremity tenderness nor edema.  Neurologic:  Normal speech and language. No gross focal neurologic deficits are appreciated.    ED Results / Procedures / Treatments   Labs (all labs ordered are listed, but only abnormal results are displayed) Labs Reviewed  BASIC METABOLIC PANEL - Abnormal; Notable for the following components:      Result Value   Glucose, Bld 118 (*)    All other components within normal limits  TROPONIN I (HIGH SENSITIVITY) - Abnormal; Notable for the following components:   Troponin I (High Sensitivity) 39 (*)    All other components within normal limits  CBC  TROPONIN I (HIGH SENSITIVITY)     EKG  ED ECG REPORT I, Chesley Noon, the attending physician, personally viewed and interpreted this ECG.   Date: 03/02/2022  EKG Time: 12:15  Rate: 79  Rhythm: normal sinus rhythm  Axis: Normal  Intervals:none  ST&T Change: None  ED ECG REPORT I, Chesley Noon, the attending physician, personally viewed and interpreted this ECG.  Date: 03/02/2022  EKG Time: 13:53  Rate: 59  Rhythm: normal sinus rhythm  Axis: Normal  Intervals:none  ST&T Change: None   RADIOLOGY Chest x-ray reviewed and interpreted by me with no infiltrate, edema, or effusion.  PROCEDURES:  Critical Care performed: No  Procedures   MEDICATIONS ORDERED IN ED: Medications - No data to display   IMPRESSION / MDM / ASSESSMENT AND PLAN / ED COURSE  I reviewed the triage vital signs and the nursing notes.                              66 y.o. male with past medical history of hyperlipidemia and GERD who presents to the ED complaining of intermittent chest pain since 8:00 this morning.  Patient's presentation is most consistent with acute presentation with potential threat to life or bodily function.  Differential  diagnosis includes, but is not limited to, ACS, PE, pneumonia, pneumothorax, dissection, GERD, musculoskeletal pain, anxiety.  Patient nontoxic-appearing and in no acute distress, vital signs are unremarkable.  EKG here in the ED does not show any signs of arrhythmia or ischemia, EKG from the PCPs office was also reviewed and does not appear concerning for STEMI.  Patient does report pain in his chest has been worsening since he has arrived, initial troponin noted to be mildly elevated.  We will repeat EKG to ensure no significant changes and continue to trend troponin.  Symptoms seem atypical for PE or dissection and we will hold off on CT imaging, chest x-ray is unremarkable.  Additional labs are reassuring with no significant anemia, leukocytosis, electrolyte abnormality, or AKI.  Patient has never had previously elevated troponin and no chronic kidney disease to explain this.  Repeat EKG without any ischemic changes, repeat troponin is pending.  Case discussed with Dr. Welton Flakes of cardiology, who will see the patient in consultation.  Case discussed with hospitalist for admission for high risk chest pain.      FINAL CLINICAL IMPRESSION(S) / ED DIAGNOSES   Final diagnoses:  Nonspecific chest pain     Rx / DC Orders   ED Discharge Orders     None        Note:  This document was prepared using Dragon voice recognition software and may include unintentional dictation errors.   Chesley Noon, MD 03/02/22 [phone]

## 2022-03-02 NOTE — Assessment & Plan Note (Addendum)
Patient with risk factors of HLD, elevated BP w/o dx HTN, family hx CAD/MI was seen at Dr. Alben Spittle office today with c/o chest pain. He was transferred to [facility]. Initial set troponins with mild elevation at  39,48.  And then started trending up 4000+. Taken for cardiac catheterization with PCI on 03/03/2022. -Continue DAPT with aspirin and ticagrelor. -Continue with atorvastatin

## 2022-03-02 NOTE — Assessment & Plan Note (Addendum)
BP within goal today.  Coreg daily  Cardiology following

## 2022-03-02 NOTE — Consult Note (Signed)
Richard Warren is a 66 y.o. male  [id number]  Primary Cardiologist: Neoma Laming, MD Reason for Consultation: chest pain  HPI: Richard Warren is a 66 y.o. male with past medical history of hyperlipidemia and GERD who presents to the ED complaining of chest pain.  Patient reports that since 8:00 this morning he has been dealing with waxing and waning pain in the center of his chest.  He describes it as a dull ache that is not exacerbated or alleviated by anything in particular.  He has not had any associated fevers, cough, or shortness of breath.  He was initially evaluated in his PCPs office, where they were concerned about EKG changes and called EMS for possible STEMI.  He was given 1 nitroglycerin and 1 baby aspirin by his PCP, states the nitroglycerin helps with the pain but caused him to feel very lightheaded and he eventually passed out.  EMS was called to his doctor's office and gave him 3 additional baby aspirin.  He denies any significant cardiac history.   Review of Systems: denies chest pain, shortness of breath   Past Medical History:  Diagnosis Date   Colon polyp    GERD (gastroesophageal reflux disease)    Hyperlipidemia    Kidney stone     (Not in a hospital admission)      Infusions:   Allergies  Allergen Reactions   Zolpidem     Depression    Social History   Socioeconomic History   Marital status: Married    Spouse name: Not on file   Number of children: Not on file   Years of education: Not on file   Highest education level: Not on file  Occupational History   Not on file  Tobacco Use   Smoking status: Never   Smokeless tobacco: Never  Vaping Use   Vaping Use: Never used  Substance and Sexual Activity   Alcohol use: Yes    Alcohol/week: 53.0 standard drinks of alcohol    Types: 50 Standard drinks or equivalent, 3 Cans of beer per week    Comment: 2 days ago last drink   Drug use: No   Sexual activity: Not on file  Other Topics  Concern   Not on file  Social History Narrative   Not on file   Social Determinants of Health   Financial Resource Strain: Not on file  Food Insecurity: Not on file  Transportation Needs: Not on file  Physical Activity: Not on file  Stress: Not on file  Social Connections: Not on file  Intimate Partner Violence: Not on file    Family History  Problem Relation Age of Onset   Dementia Mother    Heart attack Sister    Hypertension Brother    Coronary artery disease Sister    Prostate cancer Neg Hx    Bladder Cancer Neg Hx    Kidney cancer Neg Hx     PHYSICAL EXAM: Vitals:   03/02/22 1219 03/02/22 1400  BP: 120/71 135/84  Pulse: 75 70  Resp: 16 16  Temp: 98.2 F (36.8 C)   SpO2: 96% 95%    No intake or output data in the 24 hours ending 03/02/22 1437  General:  Well appearing. No respiratory difficulty HEENT: normal Neck: supple. no JVD. Carotids 2+ bilat; no bruits. No lymphadenopathy or thryomegaly appreciated. Cor: PMI nondisplaced. Regular rate & rhythm. No rubs, gallops or murmurs. Lungs: clear Abdomen: soft, nontender, nondistended. No hepatosplenomegaly. No bruits or  masses. Good bowel sounds. Extremities: no cyanosis, clubbing, rash, edema Neuro: alert & oriented x 3, cranial nerves grossly intact. moves all 4 extremities w/o difficulty. Affect pleasant.  ECG: sinus rhythm, HR 59 bpm  Results for orders placed or performed during the hospital encounter of 03/02/22 (from the past 24 hour(s))  Basic metabolic panel     Status: Abnormal   Collection Time: 03/02/22 12:22 PM  Result Value Ref Range   Sodium 141 135 - 145 mmol/L   Potassium 3.8 3.5 - 5.1 mmol/L   Chloride 110 98 - 111 mmol/L   CO2 22 22 - 32 mmol/L   Glucose, Bld 118 (H) 70 - 99 mg/dL   BUN 11 8 - 23 mg/dL   Creatinine, Ser 0.68 0.61 - 1.24 mg/dL   Calcium 9.0 8.9 - 10.3 mg/dL   GFR, Estimated >60 >60 mL/min   Anion gap 9 5 - 15  CBC     Status: None   Collection Time: 03/02/22 12:22 PM   Result Value Ref Range   WBC 5.1 4.0 - 10.5 K/uL   RBC 4.93 4.22 - 5.81 MIL/uL   Hemoglobin 15.3 13.0 - 17.0 g/dL   HCT 45.4 39.0 - 52.0 %   MCV 92.1 80.0 - 100.0 fL   MCH 31.0 26.0 - 34.0 pg   MCHC 33.7 30.0 - 36.0 g/dL   RDW 12.6 11.5 - 15.5 %   Platelets 241 150 - 400 K/uL   nRBC 0.0 0.0 - 0.2 %  Troponin I (High Sensitivity)     Status: Abnormal   Collection Time: 03/02/22 12:22 PM  Result Value Ref Range   Troponin I (High Sensitivity) 39 (H) <18 ng/L   DG Chest 2 View  Result Date: 03/02/2022 CLINICAL DATA:  Chest pain radiating into the left arm. EXAM: CHEST - 2 VIEW COMPARISON:  Radiographs 03/30/2007. FINDINGS: The heart size and mediastinal contours are stable. The lungs are clear. There is no pleural effusion or pneumothorax. Mild degenerative changes in the spine without acute osseous abnormality. IMPRESSION: Stable chest.  No active cardiopulmonary process. Electronically Signed   By: Richardean Sale M.D.   On: 03/02/2022 12:56     ASSESSMENT AND PLAN: Patient currently resting comfortably in bed. Denies current chest pain, shortness of breath. Troponin level mildly elevated. Awaiting repeat troponin level results. Discussed possibility of cardiac cath tomorrow if troponin levels continue to rise. Patient agreeable. Will continue to follow.   Engineer, drilling FNP-C

## 2022-03-02 NOTE — Progress Notes (Signed)
       CROSS COVER NOTE  NAME: RONTAE VINAS MRN: [id number] DOB : 12/23/1955    Date of Service   03/02/22  HPI/Events of Note   Critical Troponin 903, increased from 48 at 1422 today.  39-->48-->903-->2454-->4040-->4680. Nursing staff paged Cardiology with 903 result.  On arrival to bedside Mr Sorrells is sleeping comfortably. On interview he denies chest pain, nausea, vomiting, dyspnea, heartburn, fatigue, or lightheadedness.  4098: Paged Dr Welton Flakes with 4040 Troponin result.  Interventions   Plan: Heparin per pharmacy Trend Troponin until peak Obtain EKG with any report of recurrent chest pain      This document was prepared using Dragon voice recognition software and may include unintentional dictation errors.  Bishop Limbo DNP, MHA, FNP-BC Nurse Practitioner [facility] Pager [phone]

## 2022-03-02 NOTE — Assessment & Plan Note (Addendum)
By report patient had stopped taking statin  Switched to higher potency Statin therapy

## 2022-03-02 NOTE — ED Triage Notes (Signed)
Pt to ED via [facility] from [facility] for chest pain that radiates into his left arm. MD at PCP office called EMS for STEMI because pt had EKG changes. Pt denies any other symptoms. Pt states that he is not having any pain at this time. EMS EKG did not show STEMI. Pt was given 1 81 mg ASA at MD office and 1 SL nitro tablet, which dropped his blood pressure and pt had a syncopal episode. EMS gave pt 3 additional ASA when they arrived.

## 2022-03-03 ENCOUNTER — Other Ambulatory Visit: Payer: Self-pay

## 2022-03-03 ENCOUNTER — Observation Stay
Admit: 2022-03-03 | Discharge: 2022-03-03 | Disposition: A | Discharge status: Home / Self Care | Payer: Managed Care, Other (non HMO)

## 2022-03-03 ENCOUNTER — Encounter
Admission: EM | Disposition: A | Discharge status: Home / Self Care | Payer: Self-pay | Source: Home / Self Care | Attending: Osteopathic Medicine

## 2022-03-03 DIAGNOSIS — R001 Bradycardia, unspecified: Secondary | ICD-10-CM | POA: Diagnosis not present

## 2022-03-03 DIAGNOSIS — S301XXA Contusion of abdominal wall, initial encounter: Secondary | ICD-10-CM | POA: Diagnosis not present

## 2022-03-03 DIAGNOSIS — N401 Enlarged prostate with lower urinary tract symptoms: Secondary | ICD-10-CM | POA: Diagnosis present

## 2022-03-03 DIAGNOSIS — R3911 Hesitancy of micturition: Secondary | ICD-10-CM | POA: Diagnosis present

## 2022-03-03 DIAGNOSIS — I249 Acute ischemic heart disease, unspecified: Secondary | ICD-10-CM | POA: Diagnosis not present

## 2022-03-03 DIAGNOSIS — Z87442 Personal history of urinary calculi: Secondary | ICD-10-CM | POA: Diagnosis not present

## 2022-03-03 DIAGNOSIS — G8929 Other chronic pain: Secondary | ICD-10-CM | POA: Diagnosis present

## 2022-03-03 DIAGNOSIS — I214 Non-ST elevation (NSTEMI) myocardial infarction: Secondary | ICD-10-CM

## 2022-03-03 DIAGNOSIS — Y840 Cardiac catheterization as the cause of abnormal reaction of the patient, or of later complication, without mention of misadventure at the time of the procedure: Secondary | ICD-10-CM | POA: Diagnosis not present

## 2022-03-03 DIAGNOSIS — M549 Dorsalgia, unspecified: Secondary | ICD-10-CM | POA: Diagnosis present

## 2022-03-03 DIAGNOSIS — Z8249 Family history of ischemic heart disease and other diseases of the circulatory system: Secondary | ICD-10-CM | POA: Diagnosis not present

## 2022-03-03 DIAGNOSIS — K219 Gastro-esophageal reflux disease without esophagitis: Secondary | ICD-10-CM | POA: Diagnosis present

## 2022-03-03 DIAGNOSIS — E7800 Pure hypercholesterolemia, unspecified: Secondary | ICD-10-CM | POA: Diagnosis present

## 2022-03-03 DIAGNOSIS — I2510 Atherosclerotic heart disease of native coronary artery without angina pectoris: Secondary | ICD-10-CM | POA: Diagnosis present

## 2022-03-03 DIAGNOSIS — Z888 Allergy status to other drugs, medicaments and biological substances status: Secondary | ICD-10-CM | POA: Diagnosis not present

## 2022-03-03 DIAGNOSIS — Z86010 Personal history of colonic polyps: Secondary | ICD-10-CM | POA: Diagnosis not present

## 2022-03-03 DIAGNOSIS — R079 Chest pain, unspecified: Secondary | ICD-10-CM | POA: Diagnosis present

## 2022-03-03 DIAGNOSIS — Z79899 Other long term (current) drug therapy: Secondary | ICD-10-CM | POA: Diagnosis not present

## 2022-03-03 DIAGNOSIS — I455 Other specified heart block: Secondary | ICD-10-CM | POA: Diagnosis not present

## 2022-03-03 DIAGNOSIS — R55 Syncope and collapse: Secondary | ICD-10-CM | POA: Diagnosis not present

## 2022-03-03 DIAGNOSIS — R030 Elevated blood-pressure reading, without diagnosis of hypertension: Secondary | ICD-10-CM | POA: Diagnosis present

## 2022-03-03 HISTORY — PX: LEFT HEART CATH AND CORONARY ANGIOGRAPHY: CATH118249

## 2022-03-03 HISTORY — PX: CORONARY STENT INTERVENTION: CATH118234

## 2022-03-03 LAB — CBC
HCT: 42.3 % (ref 39.0–52.0)
HCT: 40.1 % (ref 39.0–52.0)
Hemoglobin: 14.3 g/dL (ref 13.0–17.0)
Hemoglobin: 13.5 g/dL (ref 13.0–17.0)
MCH: 31 pg (ref 26.0–34.0)
MCH: 31.1 pg (ref 26.0–34.0)
MCHC: 33.8 g/dL (ref 30.0–36.0)
MCHC: 33.7 g/dL (ref 30.0–36.0)
MCV: 91.6 fL (ref 80.0–100.0)
MCV: 92.4 fL (ref 80.0–100.0)
Platelets: 205 10*3/uL (ref 150–400)
Platelets: 201 10*3/uL (ref 150–400)
RBC: 4.62 MIL/uL (ref 4.22–5.81)
RBC: 4.34 MIL/uL (ref 4.22–5.81)
RDW: 12.6 % (ref 11.5–15.5)
RDW: 12.6 % (ref 11.5–15.5)
WBC: 4.9 10*3/uL (ref 4.0–10.5)
WBC: 7.4 10*3/uL (ref 4.0–10.5)
nRBC: 0 % (ref 0.0–0.2)
nRBC: 0 % (ref 0.0–0.2)

## 2022-03-03 LAB — ECHOCARDIOGRAM COMPLETE
AR max vel: 2.49 cm2
AV Area VTI: 2.81 cm2
AV Area mean vel: 2.74 cm2
AV Mean grad: 1 mmHg
AV Peak grad: 2.7 mmHg
Ao pk vel: 0.82 m/s
Area-P 1/2: 3.89 cm2
Height: 69 in
S' Lateral: 3 cm
Weight: 3360 oz

## 2022-03-03 LAB — BASIC METABOLIC PANEL
Anion gap: 6 (ref 5–15)
BUN: 11 mg/dL (ref 8–23)
CO2: 24 mmol/L (ref 22–32)
Calcium: 8.6 mg/dL — ABNORMAL LOW (ref 8.9–10.3)
Chloride: 110 mmol/L (ref 98–111)
Creatinine, Ser: 0.73 mg/dL (ref 0.61–1.24)
GFR, Estimated: >60 mL/min (ref >60)
Glucose, Bld: 110 mg/dL — ABNORMAL HIGH (ref 70–99)
Potassium: 3.6 mmol/L (ref 3.5–5.1)
Sodium: 140 mmol/L (ref 135–145)

## 2022-03-03 LAB — MRSA NEXT GEN BY PCR, NASAL: MRSA by PCR Next Gen: NOT DETECTED

## 2022-03-03 LAB — HIV ANTIBODY (ROUTINE TESTING W REFLEX): HIV Screen 4th Generation wRfx: NONREACTIVE

## 2022-03-03 LAB — TROPONIN I (HIGH SENSITIVITY)
Troponin I (High Sensitivity): 4040 ng/L (ref <18)
Troponin I (High Sensitivity): 4680 ng/L (ref <18)

## 2022-03-03 LAB — POCT ACTIVATED CLOTTING TIME
Activated Clotting Time: 269 seconds
Activated Clotting Time: 275 seconds
Activated Clotting Time: 293 seconds
Activated Clotting Time: 293 seconds

## 2022-03-03 LAB — HEPARIN LEVEL (UNFRACTIONATED)
Heparin Unfractionated: 0.59 IU/mL (ref 0.30–0.70)
Heparin Unfractionated: 0.55 IU/mL (ref 0.30–0.70)

## 2022-03-03 LAB — MAGNESIUM: Magnesium: 2 mg/dL (ref 1.7–2.4)

## 2022-03-03 SURGERY — LEFT HEART CATH AND CORONARY ANGIOGRAPHY
Anesthesia: Moderate Sedation

## 2022-03-03 MED ORDER — FENTANYL CITRATE (PF) 100 MCG/2ML IJ SOLN
INTRAMUSCULAR | Status: DC | PRN
  Administered 2022-03-03: 25 ug via INTRAVENOUS

## 2022-03-03 MED ORDER — SODIUM CHLORIDE 0.9 % IV SOLN: INTRAVENOUS | Status: AC

## 2022-03-03 MED ORDER — LIDOCAINE HCL (PF) 1 % IJ SOLN
INTRAMUSCULAR | Status: DC | PRN
  Administered 2022-03-03: 15 mL
  Administered 2022-03-03: 20 mL

## 2022-03-03 MED ORDER — LIDOCAINE HCL 1 % IJ SOLN
INTRAMUSCULAR | Status: AC
  Filled 2022-03-03: qty 20

## 2022-03-03 MED ORDER — HEPARIN SODIUM (PORCINE) 1000 UNIT/ML IJ SOLN
INTRAMUSCULAR | Status: AC
  Filled 2022-03-03: qty 10

## 2022-03-03 MED ORDER — IOHEXOL 300 MG/ML  SOLN
INTRAMUSCULAR | Status: DC | PRN
  Administered 2022-03-03: 213 mL

## 2022-03-03 MED ORDER — HEPARIN (PORCINE) IN NACL 1000-0.9 UT/500ML-% IV SOLN
INTRAVENOUS | Status: DC | PRN
  Administered 2022-03-03: 1000 mL

## 2022-03-03 MED ORDER — TICAGRELOR 90 MG PO TABS
ORAL_TABLET | ORAL | Status: DC | PRN
  Administered 2022-03-03: 180 mg via ORAL

## 2022-03-03 MED ORDER — SODIUM CHLORIDE 0.9% FLUSH: 3.0000 mL | INTRAVENOUS | Status: DC | PRN

## 2022-03-03 MED ORDER — TICAGRELOR 90 MG PO TABS
ORAL_TABLET | ORAL | Status: AC
  Filled 2022-03-03: qty 2

## 2022-03-03 MED ORDER — MIDAZOLAM HCL 2 MG/2ML IJ SOLN
INTRAMUSCULAR | Status: DC | PRN
  Administered 2022-03-03: .5 mg via INTRAVENOUS
  Administered 2022-03-03: 1 mg via INTRAVENOUS

## 2022-03-03 MED ORDER — FENTANYL CITRATE (PF) 100 MCG/2ML IJ SOLN
INTRAMUSCULAR | Status: DC | PRN
  Administered 2022-03-03: 50 ug via INTRAVENOUS
  Administered 2022-03-03: 25 ug via INTRAVENOUS

## 2022-03-03 MED ORDER — CHLORHEXIDINE GLUCONATE CLOTH 2 % EX PADS
6.0000 | MEDICATED_PAD | Freq: Every day | CUTANEOUS | Status: DC
  Administered 2022-03-03: 6 via TOPICAL

## 2022-03-03 MED ORDER — SODIUM CHLORIDE 0.9 % WEIGHT BASED INFUSION: 1.0000 mL/kg/h | INTRAVENOUS | Status: DC

## 2022-03-03 MED ORDER — FENTANYL CITRATE (PF) 100 MCG/2ML IJ SOLN
INTRAMUSCULAR | Status: AC
  Filled 2022-03-03: qty 2

## 2022-03-03 MED ORDER — ASPIRIN 81 MG PO CHEW
CHEWABLE_TABLET | ORAL | Status: AC
  Filled 2022-03-03: qty 1

## 2022-03-03 MED ORDER — ATORVASTATIN CALCIUM 20 MG PO TABS
40.0000 mg | ORAL_TABLET | Freq: Every evening | ORAL | Status: DC
  Filled 2022-03-03: qty 2

## 2022-03-03 MED ORDER — MIDAZOLAM HCL 2 MG/2ML IJ SOLN
INTRAMUSCULAR | Status: DC | PRN
  Administered 2022-03-03: .5 mg via INTRAVENOUS

## 2022-03-03 MED ORDER — SODIUM CHLORIDE 0.9 % IV SOLN: 250.0000 mL | INTRAVENOUS | Status: DC | PRN

## 2022-03-03 MED ORDER — IOHEXOL 300 MG/ML  SOLN
INTRAMUSCULAR | Status: DC | PRN
  Administered 2022-03-03: 79 mL

## 2022-03-03 MED ORDER — HEPARIN SODIUM (PORCINE) 1000 UNIT/ML IJ SOLN
INTRAMUSCULAR | Status: DC | PRN
  Administered 2022-03-03: 2000 [IU] via INTRAVENOUS
  Administered 2022-03-03: 3000 [IU] via INTRAVENOUS
  Administered 2022-03-03: 9000 [IU] via INTRAVENOUS
  Administered 2022-03-03: 2000 [IU] via INTRAVENOUS

## 2022-03-03 MED ORDER — TICAGRELOR 90 MG PO TABS
90.0000 mg | ORAL_TABLET | Freq: Two times a day (BID) | ORAL | Status: DC
  Administered 2022-03-04 – 2022-03-05 (×3): 90 mg via ORAL
  Filled 2022-03-03 (×2): qty 1

## 2022-03-03 MED ORDER — HYDRALAZINE HCL 20 MG/ML IJ SOLN: 10.0000 mg | INTRAMUSCULAR | Status: AC | PRN

## 2022-03-03 MED ORDER — SODIUM CHLORIDE 0.9 % WEIGHT BASED INFUSION
3.0000 mL/kg/h | INTRAVENOUS | Status: DC
Start: 2022-03-04 — End: 2022-03-03

## 2022-03-03 MED ORDER — LABETALOL HCL 5 MG/ML IV SOLN
10.0000 mg | INTRAVENOUS | Status: AC | PRN
Start: 2022-03-03 — End: 2022-03-03

## 2022-03-03 MED ORDER — HEPARIN (PORCINE) IN NACL 1000-0.9 UT/500ML-% IV SOLN
INTRAVENOUS | Status: DC | PRN
  Administered 2022-03-03: 500 mL

## 2022-03-03 MED ORDER — SODIUM CHLORIDE 0.9% FLUSH
3.0000 mL | Freq: Two times a day (BID) | INTRAVENOUS | Status: DC
  Administered 2022-03-04 – 2022-03-05 (×3): 3 mL via INTRAVENOUS

## 2022-03-03 MED ORDER — MIDAZOLAM HCL 2 MG/2ML IJ SOLN
INTRAMUSCULAR | Status: AC
  Filled 2022-03-03: qty 2

## 2022-03-03 MED ORDER — HEPARIN (PORCINE) IN NACL 1000-0.9 UT/500ML-% IV SOLN
INTRAVENOUS | Status: AC
  Filled 2022-03-03: qty 1000

## 2022-03-03 MED ORDER — ACETAMINOPHEN 325 MG PO TABS
ORAL_TABLET | ORAL | Status: AC
  Filled 2022-03-03: qty 1

## 2022-03-03 MED ORDER — ASPIRIN 81 MG PO CHEW
81.0000 mg | CHEWABLE_TABLET | ORAL | Status: AC
  Administered 2022-03-03: 81 mg via ORAL

## 2022-03-03 MED ORDER — TICAGRELOR 90 MG PO TABS
90.0000 mg | ORAL_TABLET | Freq: Two times a day (BID) | ORAL | Status: DC
  Filled 2022-03-03: qty 1

## 2022-03-03 MED ORDER — SODIUM CHLORIDE 0.9% FLUSH
3.0000 mL | Freq: Two times a day (BID) | INTRAVENOUS | Status: DC
Start: 2022-03-03 — End: 2022-03-05
  Administered 2022-03-03 – 2022-03-05 (×4): 3 mL via INTRAVENOUS

## 2022-03-03 MED ORDER — NITROGLYCERIN 1 MG/10 ML FOR IR/CATH LAB
INTRA_ARTERIAL | Status: DC | PRN
  Administered 2022-03-03 (×4): 200 ug via INTRACORONARY

## 2022-03-03 SURGICAL SUPPLY — 26 items
BALLN MINITREK RX 2.0X12 (BALLOONS) ×2
BALLN TREK RX 2.25X12 (BALLOONS) ×2
BALLN ~~LOC~~ TREK NEO RX 2.5X12 (BALLOONS) ×2
BALLN ~~LOC~~ TREK NEO RX 2.75X12 (BALLOONS) ×1 IMPLANT
BALLOON MINITREK RX 2.0X12 (BALLOONS) IMPLANT
BALLOON TREK RX 2.25X12 (BALLOONS) IMPLANT
BALLOON ~~LOC~~ TREK NEO RX 2.5X12 (BALLOONS) IMPLANT
CATH INFINITI 5FR MULTPACK ANG (CATHETERS) ×1 IMPLANT
CATH LAUNCHER 6FR EBU 3.75 (CATHETERS) ×1 IMPLANT
DEVICE CLOSURE MYNXGRIP 6/7F (Vascular Products) ×1 IMPLANT
KIT ENCORE 26 ADVANTAGE (KITS) ×1 IMPLANT
NDL PERC 18GX7CM (NEEDLE) IMPLANT
NEEDLE PERC 18GX7CM (NEEDLE) ×2 IMPLANT
PACK CARDIAC CATH (CUSTOM PROCEDURE TRAY) ×2 IMPLANT
PROTECTION STATION PRESSURIZED (MISCELLANEOUS) ×2
SET ATX SIMPLICITY (MISCELLANEOUS) ×1 IMPLANT
SHEATH AVANTI 5FR X 11CM (SHEATH) ×1 IMPLANT
SHEATH AVANTI 6FR X 11CM (SHEATH) ×1 IMPLANT
STATION PROTECTION PRESSURIZED (MISCELLANEOUS) IMPLANT
STENT ONYX FRONTIER 2.0X22 (Permanent Stent) ×1 IMPLANT
STENT ONYX FRONTIER 2.25X18 (Permanent Stent) ×1 IMPLANT
STENT ONYX FRONTIER 2.5X18 (Permanent Stent) ×1 IMPLANT
TUBING CIL FLEX 10 FLL-RA (TUBING) ×1 IMPLANT
WIRE G HI TQ BMW 190 (WIRE) ×1 IMPLANT
WIRE GUIDERIGHT .035X150 (WIRE) ×1 IMPLANT
WIRE RUNTHROUGH .014X180CM (WIRE) ×1 IMPLANT

## 2022-03-03 NOTE — Progress Notes (Signed)
Rapid Response Event Note   Reason for Call : Bradycardia, pause   Initial Focused Assessment: On my arrival pt is pale and diaphoretic, lying in Trendelenburg d/t hypotension and bradycardia. Pt is awake and oriented but states he "feels weird". Special recovery RN states that pt ha cardiac cath with stents placed and was doing okay until he suddenly felt like he was going to pass out, went bradycardic, had a long pause in his HR, and passed out briefly. Pt also became hypotensive during this episode.   Interventions: Pt placed on 2L [place]. 12 lead EKG performed. Pt receiving fluids at 974ml/hr. Dr. Okey Dupre and hospitalist paged and arrived at bedside.    Plan of Care:  Pt started feeling better and VS stable. HR and BP back to baseline. Fluids back to 36ml/hr. Pt is still alert and oriented. Dr. Okey Dupre has ordered for pt to transfer to SD for closer monitoring. Pt will transfer to ICU 20.    Event Summary:   MD Notified: Dr. Alexander/Dr. End Call Time: 1827 Arrival Time: 1830 End Time: Pt transferred to ICU 20  Henrene Dodge, RN

## 2022-03-03 NOTE — Progress Notes (Signed)
Event Summary: 1824 Noted on monitor that PT HR had gone from NSR 70's-80's to brady in the 40's. Looked at pt who poked his head up and said he didn't feel right. This Rn and Runner, broadcasting/film/video to bedside. Noted pt to be pale and diaphoretic. PT stated he felt like he was going to pass out. Alyson RN assessed groin to which there was no obvious bleeding, however for safety, initiated manual pressure application. PT stated twice he was going to pass out then stated "here I go"  and went limp and monitor read asystole at 1825 for 2-3 seconds. This RN attempted to feel for radial pulse with no success. Alyson RN started to initiate compressions x2.  1826 This RN called [phone] for code blue but while on the phone pt suddenly regained pulse and opened eyes. EKG obtained. Requested operator to still send rapid response. Fluids increased to 950mL/hr. Applied crash pads to pt and pt placed on oxygen by [employer].  This RN paged hospitalist and Dr. Okey Dupre who both came to bedside. Per hospitalst Alexander MD, ok to keep fluids at 999. PT was able to intermittently respond, remained brady but kept pulses.  In less than 10 minutes pt regained color and was able to carry a conversation with BP returning to normal limits.  1900 Fluids returned to 22mL/hr per End MD  Plan to transfer pt to step down level, ICU rapid response nurse at bedside made aware of plan to move pt.

## 2022-03-03 NOTE — Progress Notes (Signed)
[facility]  Date: 03/03/22  Time: 6:57 PM  I was alerted by the patient's RN in Special/Recovery that Mr. Milia had an episode of asystole.  He complained of dizziness and was noted to be diaphoretic.  Monitor showed sinus bradycardia followed by sinus arrest with a 12 second pause.  No clear P waves are seen, suggesting sinus arrest.  He had spontaneous return of pulse.  He felt like he needed to void and also pass gas shortly before the episode.  Immediately after the episode, manual pressure was applied to the right groin out of concern that bleeding may have precipitated the event.  He notes some low-back pain, which he has experienced in the past.  He also reports several prior syncopal episodes, often during stressful situations.  Currently, Mr. Lubrano feels back to normal.  His HR is in the 50's and 60's, which has been his baseline during/after cath.  Exam is notable for RRR w/o murmurs.  Right groin is soft without obvious hematoma or bleeding.  BP is currently 118/72 following NS bolus.  HR is 53 bpm.  EKG shows sinus bradycardia with PAC.  No significant ischemic changes noted.  Limited bedside echo was personally performed and shows subtle anterolateral hypokinesis with overall preserved LVEF.  No pericardial effusion identified on apical and parasternal views.  I suspect event is most consistent with vasovagal event.  HR and BP have returned to baseline.  No evidence of bleeding noted at right femoral cath site.  Given absence of chest pain and nonischemic EKG, I do not believe that he is having acute stent closure.  We will transfer him to stepdown for close monitoring.  If he has recurrent episodes, I would have a low threshold for CT abdomen/pelvis w/o contrast.  Patient and his wife have been updated on findings and plan.  Yvonne Kendall, MD [facility]

## 2022-03-03 NOTE — Progress Notes (Signed)
I responded to a page from the nurse to provide spiritual support for the patient's wife. I arrived at the unit and found the patient's wife outside of the unit in a waiting area. I entered the unit and received an update from the nurse. I went to the waiting area and shared words of encouragement and led his wife back to the bedside of the patient. I provided spiritual support though pastoral presence and by leading in prayer.    03/03/22 1921  Clinical Encounter Type  Visited With Patient and family together  Visit Type Spiritual support  Referral From Nurse  Consult/Referral To Chaplain  Spiritual Encounters  Spiritual Needs Prayer;Emotional    Chaplain Dr Melvyn Novas

## 2022-03-04 ENCOUNTER — Inpatient Hospital Stay: Payer: Managed Care, Other (non HMO)

## 2022-03-04 ENCOUNTER — Encounter: Payer: Self-pay | Admitting: Cardiovascular Disease

## 2022-03-04 ENCOUNTER — Other Ambulatory Visit: Payer: Self-pay

## 2022-03-04 ENCOUNTER — Other Ambulatory Visit (HOSPITAL_COMMUNITY): Payer: Self-pay

## 2022-03-04 DIAGNOSIS — I455 Other specified heart block: Secondary | ICD-10-CM | POA: Diagnosis not present

## 2022-03-04 DIAGNOSIS — I214 Non-ST elevation (NSTEMI) myocardial infarction: Secondary | ICD-10-CM | POA: Diagnosis not present

## 2022-03-04 DIAGNOSIS — I2510 Atherosclerotic heart disease of native coronary artery without angina pectoris: Secondary | ICD-10-CM | POA: Diagnosis not present

## 2022-03-04 DIAGNOSIS — S3012XA Contusion of groin, initial encounter: Secondary | ICD-10-CM

## 2022-03-04 DIAGNOSIS — S301XXA Contusion of abdominal wall, initial encounter: Secondary | ICD-10-CM | POA: Diagnosis not present

## 2022-03-04 LAB — GLUCOSE, CAPILLARY: Glucose-Capillary: 187 mg/dL — ABNORMAL HIGH (ref 70–99)

## 2022-03-04 LAB — CBC
HCT: 37.6 % — ABNORMAL LOW (ref 39.0–52.0)
Hemoglobin: 12.8 g/dL — ABNORMAL LOW (ref 13.0–17.0)
MCH: 31.6 pg (ref 26.0–34.0)
MCHC: 34 g/dL (ref 30.0–36.0)
MCV: 92.8 fL (ref 80.0–100.0)
Platelets: 183 10*3/uL (ref 150–400)
RBC: 4.05 MIL/uL — ABNORMAL LOW (ref 4.22–5.81)
RDW: 12.7 % (ref 11.5–15.5)
WBC: 6.6 10*3/uL (ref 4.0–10.5)
nRBC: 0 % (ref 0.0–0.2)

## 2022-03-04 LAB — LIPID PANEL
Cholesterol: 166 mg/dL (ref 0–200)
HDL: 34 mg/dL — ABNORMAL LOW (ref >40)
LDL Cholesterol: 107 mg/dL — ABNORMAL HIGH (ref 0–99)
Total CHOL/HDL Ratio: 4.9 RATIO
Triglycerides: 123 mg/dL (ref <150)
VLDL: 25 mg/dL (ref 0–40)

## 2022-03-04 LAB — BASIC METABOLIC PANEL
Anion gap: 6 (ref 5–15)
BUN: 13 mg/dL (ref 8–23)
CO2: 22 mmol/L (ref 22–32)
Calcium: 8.5 mg/dL — ABNORMAL LOW (ref 8.9–10.3)
Chloride: 112 mmol/L — ABNORMAL HIGH (ref 98–111)
Creatinine, Ser: 0.78 mg/dL (ref 0.61–1.24)
GFR, Estimated: >60 mL/min (ref >60)
Glucose, Bld: 107 mg/dL — ABNORMAL HIGH (ref 70–99)
Potassium: 3.7 mmol/L (ref 3.5–5.1)
Sodium: 140 mmol/L (ref 135–145)

## 2022-03-04 MED ORDER — ASPIRIN 81 MG PO TBEC
81.0000 mg | DELAYED_RELEASE_TABLET | Freq: Every day | ORAL | Status: DC
  Administered 2022-03-04 – 2022-03-05 (×2): 81 mg via ORAL
  Filled 2022-03-04 (×2): qty 1

## 2022-03-04 MED ORDER — ATORVASTATIN CALCIUM 20 MG PO TABS
80.0000 mg | ORAL_TABLET | Freq: Every evening | ORAL | Status: DC
Start: 2022-03-04 — End: 2022-03-05
  Administered 2022-03-04: 80 mg via ORAL
  Filled 2022-03-04: qty 4

## 2022-03-04 MED ORDER — OXYCODONE HCL 5 MG PO TABS
5.0000 mg | ORAL_TABLET | Freq: Once | ORAL | Status: AC
  Administered 2022-03-04: 5 mg via ORAL
  Filled 2022-03-04: qty 1

## 2022-03-04 MED ORDER — ENOXAPARIN SODIUM 60 MG/0.6ML IJ SOSY
0.5000 mg/kg | PREFILLED_SYRINGE | INTRAMUSCULAR | Status: DC
  Administered 2022-03-04 – 2022-03-05 (×2): 47.5 mg via SUBCUTANEOUS
  Filled 2022-03-04 (×2): qty 0.47

## 2022-03-04 MED ORDER — HYDROCODONE-ACETAMINOPHEN 5-325 MG PO TABS
1.0000 | ORAL_TABLET | ORAL | Status: DC | PRN
  Administered 2022-03-04: 2 via ORAL
  Administered 2022-03-05: 1 via ORAL
  Filled 2022-03-04: qty 1
  Filled 2022-03-04: qty 2

## 2022-03-04 MED ORDER — MORPHINE SULFATE (PF) 2 MG/ML IV SOLN
2.0000 mg | INTRAVENOUS | Status: AC | PRN
  Administered 2022-03-04 (×4): 2 mg via INTRAVENOUS
  Filled 2022-03-04 (×4): qty 1

## 2022-03-04 NOTE — Progress Notes (Signed)
       CROSS COVER NOTE  NAME: Richard Warren MRN: [id number] DOB : [birth year]    Date of Service   03/03/22  HPI/Events of Note   2015: Checked on patient shortly after arrival to CCU. Reports his chest pain is resolved and he overall feels much better than when I saw him last night. He is reporting chronic back pain that is mild that he relates to positioning and laying in the hospital bed.   0230: Secure chat received from nursing "this patient is complaining of 8/8 pain in his right groin related to a hematoma at the heart cath incision. Cardiology is aware of the hematoma and pressure was held earlier in the shift. He just woke up, and he is requesting pain meds. All he has is tylenol, and he is requesting something stronger" "I marked it earlier, and it hasn't grown but it is firm and bruised. It doesn't look good, but it has not gotten bigger or worst since holding pressure"  Shortly after arrival to bedside on call cardiologist came to assess groin site, see Cardiology note.    Interventions   Plan: Oxycodone x1 Follow H&H      This document was prepared using Dragon voice recognition software and may include unintentional dictation errors.  Neomia Glass DNP, MHA, FNP-BC Nurse Practitioner [facility] Pager [phone]

## 2022-03-04 NOTE — Assessment & Plan Note (Signed)
Patient developed sinus arrest following PCI, spontaneously resumed pulse. Cardiology is recommending avoid beta-blocker and nondihydropyridine calcium channel blocker. -Continue to monitor

## 2022-03-04 NOTE — Progress Notes (Signed)
SUBJECTIVE: Richard Warren is a 66 y.o. male with past medical history of hyperlipidemia and GERD who presented to the ED complaining of chest pain.    Patient underwent cardiac catheterization on 03/03/22. Left main was unremarkable LAD had mild irregularities.  First diagonal is a very large vessel which has proximal 99% lesion.  Left circumflex has mid to distal 99% lesion.  RCA itself is free of disease but PLV branch at the ostium has 50% disease.  PDA of branch of RCA distally has about 50% disease also.  Left ventricular ejection fraction was preserved approximately 60%.  PCI completed by Dr. Saunders Revel.  Successful complex bifurcation PCI to large D2 branch using a mini-crush technique with Onyx Frontier 2.25 x 18 mm (main branch) and 2.0 x 22 mm (sidebranch) drug-eluting stents with 0% residual stenosis and TIMI-3 flow. Successful PCI to distal LCx using Onyx Frontier 2.5 x 18 mm drug-eluting stent with 0% residual stenosis and TIMI-3 flow.  Patient experienced syncopal episode post procedure. Patient felt faint, bradycardic, sinus pause/asystole lasting approximately 12 seconds. Was admitted to ICU for further monitoring.     Vitals:   03/04/22 0500 03/04/22 0600 03/04/22 0700 03/04/22 0800  BP: 116/77 108/70 113/77 106/74  Pulse: 65 (!) 58 (!) 57 (!) 58  Resp: '16 16 18 17  '$ Temp:      TempSrc:      SpO2: 98% 97% 97% 98%  Weight:      Height:        Intake/Output Summary (Last 24 hours) at 03/04/2022 0934 Last data filed at 03/04/2022 0600 Gross per 24 hour  Intake 593.76 ml  Output 1450 ml  Net -856.24 ml    LABS: Basic Metabolic Panel: Recent Labs    03/03/22 0546 03/04/22 0512  NA 140 140  K 3.6 3.7  CL 110 112*  CO2 24 22  GLUCOSE 110* 107*  BUN 11 13  CREATININE 0.73 0.78  CALCIUM 8.6* 8.5*  MG 2.0  --    Liver Function Tests: No results for input(s): "AST", "ALT", "ALKPHOS", "BILITOT", "PROT", "ALBUMIN" in the last 72 hours. No results for input(s):  "LIPASE", "AMYLASE" in the last 72 hours. CBC: Recent Labs    03/03/22 2216 03/04/22 0512  WBC 7.4 6.6  HGB 13.5 12.8*  HCT 40.1 37.6*  MCV 92.4 92.8  PLT 201 183   Cardiac Enzymes: No results for input(s): "CKTOTAL", "CKMB", "CKMBINDEX", "TROPONINI" in the last 72 hours. BNP: Invalid input(s): "POCBNP" D-Dimer: No results for input(s): "DDIMER" in the last 72 hours. Hemoglobin A1C: No results for input(s): "HGBA1C" in the last 72 hours. Fasting Lipid Panel: Recent Labs    03/04/22 0512  CHOL 166  HDL 34*  LDLCALC 107*  TRIG 123  CHOLHDL 4.9   Thyroid Function Tests: No results for input(s): "TSH", "T4TOTAL", "T3FREE", "THYROIDAB" in the last 72 hours.  Invalid input(s): "FREET3" Anemia Panel: No results for input(s): "VITAMINB12", "FOLATE", "FERRITIN", "TIBC", "IRON", "RETICCTPCT" in the last 72 hours.   PHYSICAL EXAM General: Well developed, well nourished, in no acute distress HEENT:  Normocephalic and atramatic Neck:  No JVD.  Lungs: Clear bilaterally to auscultation and percussion. Heart: HRRR . Normal S1 and S2 without gallops or murmurs.  Abdomen: Bowel sounds are positive, abdomen soft and non-tender    Groin: right groin hematoma, ecchymosis  Neuro: Alert and oriented X 3. Psych:  Good affect, responds appropriately  TELEMETRY: sinus rhythm, HR 60 bpm  ASSESSMENT AND PLAN: Patient resting in bed.  Denies chest pain, shortness of breath. Patient developed hematoma post procedure yesterday. Pain improved on morphine. Right groin ultrasound performed this morning showed no evidence of pseudoaneurysm. No current changes. Will continue to monitor.   Principal Problem:   Acute coronary syndrome with high troponin (HCC) Active Problems:   Esophageal reflux   Elevated blood-pressure reading without diagnosis of hypertension   Hypercholesteremia   CAD (coronary artery disease)   Non-ST elevation (NSTEMI) myocardial infarction ([place])    Richard Albanese,  FNP-C 03/04/2022 9:34 AM

## 2022-03-04 NOTE — Progress Notes (Addendum)
Interventional Cardiology Progress Note  Patient Name: Richard SOULLIERE Date of Encounter: 03/04/2022  [facility] Cardiologist: Humphrey Rolls  Subjective   I was alerted yesterday evening at ~2200 that patient had developed right groin hematoma.  Manual compression was applied by charge RN without expansion of hematoma.  CBC was stable.  I was alerted by RN at ~0230 this AM that patient awoke with 10/10 groin pain.  Right groin bruising was more pronounced but hematoma was stable in size.  He was given oxycodone x 1 by Hospitalist team with continued severe pain.  On may assessment, patient still reports 10/10 pain in the right groin radiating around to the hip.  He denies CP and shortness of breath.  Chronic back pain is stable.  Inpatient Medications    Scheduled Meds:  acetaminophen       acidophilus  2 capsule Oral Daily   atorvastatin  40 mg Oral QPM   Chlorhexidine Gluconate Cloth  6 each Topical Q0600   pantoprazole  40 mg Oral Daily   sodium chloride flush  3 mL Intravenous Q12H   sodium chloride flush  3 mL Intravenous Q12H   ticagrelor  90 mg Oral BID   Continuous Infusions:  sodium chloride 50 mL/hr at 03/04/22 0001   sodium chloride     heparin Stopped (03/03/22 1431)   PRN Meds: sodium chloride, acetaminophen, acetaminophen, alum & mag hydroxide-simeth, melatonin, morphine injection, ondansetron (ZOFRAN) IV, sodium chloride flush   Vital Signs    Vitals:   03/04/22 0000 03/04/22 0100 03/04/22 0200 03/04/22 0300  BP: 109/69 110/71 110/71 126/72  Pulse: 67 62 (!) 57 64  Resp: (!) '22 20 20 16  '$ Temp:    98.4 F (36.9 C)  TempSrc:    Oral  SpO2: 97% 97% 98% 99%  Weight:      Height:        Intake/Output Summary (Last 24 hours) at 03/04/2022 0327 Last data filed at 03/03/2022 2200 Gross per 24 hour  Intake 904.74 ml  Output 1575 ml  Net -670.26 ml      03/03/2022    7:50 PM 03/03/2022    2:20 PM 03/02/2022   12:19 PM  Last 3 Weights  Weight (lbs) 210 lb  1.6 oz 210 lb 1.6 oz 210 lb  Weight (kg) 95.3 kg 95.3 kg 95.255 kg      Telemetry    Sinus rhythm. - Personally Reviewed  ECG   No new tracing  Physical Exam   GEN: No acute distress.   Ext: Right groin bruising present and within previously marked boundaries.  Site is very tender to palpation.  Dressing remains in place at puncture site with minimal dried blood on gauze.  There is some firmness extending medially that likely represents previously noted hematoma.  No bruit noted.  RLE is warm with 2+ DP pulse.  Labs    High Sensitivity Troponin:   Recent Labs  Lab 03/02/22 1422 03/02/22 1929 03/02/22 2249 03/03/22 0150 03/03/22 0546  TROPONINIHS 48* 903* 2,454* 4,040* 4,680*     Chemistry Recent Labs  Lab 03/02/22 1222 03/03/22 0546  NA 141 140  K 3.8 3.6  CL 110 110  CO2 22 24  GLUCOSE 118* 110*  BUN 11 11  CREATININE 0.68 0.73  CALCIUM 9.0 8.6*  MG  --  2.0  GFRNONAA >60 >60  ANIONGAP 9 6    Lipids No results for input(s): "CHOL", "TRIG", "HDL", "LABVLDL", "LDLCALC", "CHOLHDL" in the last 168  hours.  Hematology Recent Labs  Lab 03/02/22 1222 03/03/22 0546 03/03/22 2216  WBC 5.1 4.9 7.4  RBC 4.93 4.62 4.34  HGB 15.3 14.3 13.5  HCT 45.4 42.3 40.1  MCV 92.1 91.6 92.4  MCH 31.0 31.0 31.1  MCHC 33.7 33.8 33.7  RDW 12.6 12.6 12.6  PLT 241 205 201   Thyroid No results for input(s): "TSH", "FREET4" in the last 168 hours.  BNPNo results for input(s): "BNP", "PROBNP" in the last 168 hours.  DDimer No results for input(s): "DDIMER" in the last 168 hours.   Radiology    CARDIAC CATHETERIZATION  Result Date: 03/03/2022 Conclusions: Severe two-vessel coronary artery disease involving large D2 and distal LCx.  See Dr. Laurelyn Sickle diagnostic catheterization report for further details. Successful complex bifurcation PCI to large D2 branch using a mini-crush technique with Onyx Frontier 2.25 x 18 mm (main branch) and 2.0 x 22 mm (sidebranch) drug-eluting stents with  0% residual stenosis and TIMI-3 flow. Successful PCI to distal LCx using Onyx Frontier 2.5 x 18 mm drug-eluting stent with 0% residual stenosis and TIMI-3 flow. Recommendations: Dual antiplatelet therapy with aspirin and ticagrelor for at least 12 months, ideally longer given complex bifurcation stenting of D2. Aggressive secondary prevention of coronary artery disease. Post catheterization hydration given significant contrast exposure during diagnostic and interventional procedure. Nelva Bush, MD [facility]  CARDIAC CATHETERIZATION  Result Date: 03/03/2022   RPAV lesion is 50% stenosed.   RPDA lesion is 50% stenosed.   Mid Cx lesion is 99% stenosed.   1st Diag lesion is 99% stenosed.   The left ventricular systolic function is normal.   LV Paige Monarrez diastolic pressure is moderately elevated. High-grade lesion 99% large first diagonal and 99% mid to distal left circumflex disease with mild irregularities in the LAD.  RCA itself has no significant disease but PLV branch has 50% ostial disease and distal PDA has 50% disease with normal ejection fraction approximately 60%.  Patient will have PCI of the left circumflex and diagonal and will be staying here overnight.   ECHOCARDIOGRAM COMPLETE  Result Date: 03/03/2022    ECHOCARDIOGRAM REPORT   Patient Name:   Richard Warren Date of Exam: 03/03/2022 Medical Rec #:  [id number]         Height:       69.0 in Accession #:    [id number]        Weight:       210.0 lb Date of Birth:  Jul 22, 1956         BSA:          2.109 m Patient Age:    66 years          BP:           129/79 mmHg Patient Gender: M                 HR:           64 bpm. Exam Location:  [facility] Procedure: 2D Echo, Cardiac Doppler and Color Doppler Indications:     Chest pain R07.9  History:         Patient has no prior history of Echocardiogram examinations.                  Risk Factors:Dyslipidemia.  Sonographer:     Sherrie Sport Referring Phys:  [id number] AMBER SCOGGINS Diagnosing Phys: Neoma Laming   Sonographer Comments: Suboptimal apical window and no subcostal window. IMPRESSIONS  1. Left ventricular ejection fraction,  by estimation, is 55 to 60%. The left ventricle has normal function. The left ventricle has no regional wall motion abnormalities. There is mild concentric left ventricular hypertrophy. Left ventricular diastolic parameters are consistent with Grade I diastolic dysfunction (impaired relaxation).  2. Right ventricular systolic function is normal. The right ventricular size is normal.  3. The mitral valve is normal in structure. Trivial mitral valve regurgitation. No evidence of mitral stenosis.  4. The aortic valve is normal in structure. Aortic valve regurgitation is not visualized. No aortic stenosis is present.  5. The inferior vena cava is normal in size with greater than 50% respiratory variability, suggesting right atrial pressure of 3 mmHg. FINDINGS  Left Ventricle: Left ventricular ejection fraction, by estimation, is 55 to 60%. The left ventricle has normal function. The left ventricle has no regional wall motion abnormalities. The left ventricular internal cavity size was normal in size. There is  mild concentric left ventricular hypertrophy. Left ventricular diastolic parameters are consistent with Grade I diastolic dysfunction (impaired relaxation). Right Ventricle: The right ventricular size is normal. No increase in right ventricular wall thickness. Right ventricular systolic function is normal. Left Atrium: Left atrial size was normal in size. Right Atrium: Right atrial size was normal in size. Pericardium: There is no evidence of pericardial effusion. Mitral Valve: The mitral valve is normal in structure. Trivial mitral valve regurgitation. No evidence of mitral valve stenosis. Tricuspid Valve: The tricuspid valve is normal in structure. Tricuspid valve regurgitation is not demonstrated. No evidence of tricuspid stenosis. Aortic Valve: The aortic valve is normal in structure.  Aortic valve regurgitation is not visualized. No aortic stenosis is present. Aortic valve mean gradient measures 1.0 mmHg. Aortic valve peak gradient measures 2.7 mmHg. Aortic valve area, by VTI measures 2.81 cm. Pulmonic Valve: The pulmonic valve was normal in structure. Pulmonic valve regurgitation is not visualized. No evidence of pulmonic stenosis. Aorta: The aortic root is normal in size and structure. Venous: The inferior vena cava is normal in size with greater than 50% respiratory variability, suggesting right atrial pressure of 3 mmHg. IAS/Shunts: No atrial level shunt detected by color flow Doppler.  LEFT VENTRICLE PLAX 2D LVIDd:         4.30 cm   Diastology LVIDs:         3.00 cm   LV e' medial:    6.42 cm/s LV PW:         0.80 cm   LV E/e' medial:  10.4 LV IVS:        1.30 cm   LV e' lateral:   6.53 cm/s LVOT diam:     2.10 cm   LV E/e' lateral: 10.2 LV SV:         36 LV SV Index:   17 LVOT Area:     3.46 cm  RIGHT VENTRICLE RV S prime:     9.90 cm/s TAPSE (M-mode): 2.2 cm LEFT ATRIUM             Index        RIGHT ATRIUM           Index LA Vol (A2C):   23.9 ml 11.33 ml/m  RA Area:     16.70 cm LA Vol (A4C):   29.7 ml 14.08 ml/m  RA Volume:   47.40 ml  22.47 ml/m LA Biplane Vol: 27.3 ml 12.94 ml/m  AORTIC VALVE AV Area (Vmax):    2.49 cm AV Area (Vmean):   2.74 cm AV Area (  VTI):     2.81 cm AV Vmax:           81.80 cm/s AV Vmean:          48.000 cm/s AV VTI:            0.128 m AV Peak Grad:      2.7 mmHg AV Mean Grad:      1.0 mmHg LVOT Vmax:         58.70 cm/s LVOT Vmean:        38.000 cm/s LVOT VTI:          0.104 m LVOT/AV VTI ratio: 0.81  AORTA Ao Root diam: 3.07 cm MITRAL VALVE               TRICUSPID VALVE MV Area (PHT): 3.89 cm    TR Peak grad:   13.2 mmHg MV Decel Time: 195 msec    TR Vmax:        182.00 cm/s MV E velocity: 66.80 cm/s MV A velocity: 75.40 cm/s  SHUNTS MV E/A ratio:  0.89        Systemic VTI:  0.10 m                            Systemic Diam: 2.10 cm Neoma Laming  Electronically signed by Neoma Laming Signature Date/Time: 03/03/2022/1:34:15 PM    Final    DG Chest 2 View  Result Date: 03/02/2022 CLINICAL DATA:  Chest pain radiating into the left arm. EXAM: CHEST - 2 VIEW COMPARISON:  Radiographs 03/30/2007. FINDINGS: The heart size and mediastinal contours are stable. The lungs are clear. There is no pleural effusion or pneumothorax. Mild degenerative changes in the spine without acute osseous abnormality. IMPRESSION: Stable chest.  No active cardiopulmonary process. Electronically Signed   By: Richardean Sale M.D.   On: 03/02/2022 12:56    Cardiac Studies   See above.  Patient Profile     65 y.o. male admitted with NSTEMI s/p complex PCI to D2 and LCx complicated by right groin hematoma as well as post-cath sinus arrest.  Assessment & Plan    Groin hematoma: Site appears stable without evidence of active bleeding/hematoma expansion.  I do not appreciate a bruit; however, I will order a Duplex to be done in the morning to exclude pseudoaneurysm.  I have a low suspicion for RP bleed given location of pain and hemodynamic stability.  PRN morphine ordered, with pain already starting to improve.  AM CBC ordered.  Sinus arrest: Patient noted to have 12 second sinus pause following PCI yesterday, most likely vasovagal in nature.  Since coming to ICU, HR has been stable w/o further pauses.  Continue telemetry monitoring and avoidance of beta-blockers and non-dihydropyridine calcium channel blockers.  NSTEMI: No angina reported.  Continue DAPT with ASA and ticagrelor as well as secondary prevention with atorvastatin.  Ongoing general cardiology management per Dr. Humphrey Rolls.  For questions or updates, please contact [place] Please consult [email or web address] for contact info under [facility].     Signed, Nelva Bush, MD  03/04/2022, 3:27 AM

## 2022-03-04 NOTE — Hospital Course (Signed)
Taken from prior notes.  [name], a 66 y/o, with h/o HLD, GERD and positive family hx for CAD/MI presented to his doctor's office 06/26 with c/o  central chest pain starting at 0800 hrs. Pain did radiate to his left arm but denies SOB or diaphoresis. He had an EKG which was worrisome. Patient was given ASA and NTG which did relieve his pain but lead to a syncopal episode. EMS transported patient to [facility]-ED for evaluation.  06/26: afebrile 144/71  95  RR 18. NAD. Lab: glucose 118, CBCD nl, Troponins 39 to 48. EKG w/o acute changes. EDP consulted cardiology. TRH called to admit for continued workup for atypical chest pain. Heparin gtt.  06/27: troponin trending up. Cath: PCI to diagonal and LCx successful. Plan for long-term DAPT. Per Dr. Saunders Revel, if remains chest-pain free can discharge tomorrow 06/28. 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 Slight decrease in hemoglobin to 12.8.  Hematoma now stable. Doppler studies which were ordered by cardiology were negative for any pseudoaneurysm. History of sinus arrest following catheterization-cardiology is recommending no beta-blocker and nondihydropyridine calcium channel blockers. Continue to have significant pain.  6/29: Patient remained stable.  Hematoma and bruising seems improving.  Able to ambulate without any chest pain or shortness of breath. He is being discharged on DAPT and will follow-up with his cardiologist closely for further recommendations.  He will continue current medications and follow-up with his providers.

## 2022-03-04 NOTE — Progress Notes (Signed)
PHARMACIST - PHYSICIAN COMMUNICATION  CONCERNING:  Enoxaparin (Lovenox) for DVT Prophylaxis    RECOMMENDATION: Patient was prescribed enoxaprin '40mg'$  q24 hours for VTE prophylaxis.   Filed Weights   03/02/22 1219 03/03/22 1420 03/03/22 1950  Weight: 95.3 kg (210 lb) 95.3 kg (210 lb 1.6 oz) 95.3 kg (210 lb 1.6 oz)    Body mass index is 31.03 kg/m.  Estimated Creatinine Clearance: 106.2 mL/min (by C-G formula based on SCr of 0.73 mg/dL).   Based on [place] patient is candidate for enoxaparin 0.'5mg'$ /kg TBW SQ every 24 hours based on BMI being >30.  DESCRIPTION: Pharmacy has adjusted enoxaparin dose per [facility] policy.  Patient is now receiving enoxaparin 0.5 mg/kg every 24 hours   Renda Rolls, PharmD, [facility] 03/04/2022 3:57 AM

## 2022-03-04 NOTE — Progress Notes (Signed)
  Chaplain On-Call responded to Spiritual Care Consult Order from Emeterio Reeve, DO, for Advance Directives information for the patient.  Chaplain met the patient and his wife at bedside. Chaplain provided the documents and education for the patient, who stated his understanding.  Chaplain also informed patient about the process for completion of the documents if he chooses while in the hospital.  Deirdre Peer M.Div., [facility]

## 2022-03-04 NOTE — Progress Notes (Signed)
Progress Note   Patient: Richard Warren BDZ:[id number] DOB: 1956-04-23 DOA: 03/02/2022     1 DOS: the patient was seen and examined on 03/04/2022   Brief hospital course: Taken from prior notes.  Richard Warren, a 67 y/o, with h/o HLD, GERD and positive family hx for CAD/MI presented to his doctor's office 06/26 with c/o  central chest pain starting at 0800 hrs. Pain did radiate to his left arm but denies SOB or diaphoresis. He had an EKG which was worrisome. Patient was given ASA and NTG which did relieve his pain but lead to a syncopal episode. EMS transported patient to [facility]ED for evaluation.  Marland Kitchen 06/26: afebrile 144/71  95  RR 18. NAD. Lab: glucose 118, CBCD nl, Troponins 39 to 48. EKG w/o acute changes. EDP consulted cardiology. TRH called to admit for continued workup for atypical chest pain. Heparin gtt.  Marland Kitchen 06/27: troponin trending up. Cath: PCI to diagonal and LCx successful. Plan for long-term DAPT. Per Dr. Saunders Revel, if remains chest-pain free can discharge tomorrow 06/28. 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  6/28; patient developed right groin hematoma with quite extensive bruising after catheterization. Slight decrease in hemoglobin to 12.8.  Hematoma now stable. Doppler studies which were ordered by cardiology were negative for any pseudoaneurysm. History of sinus arrest following catheterization-cardiology is recommending no beta-blocker and nondihydropyridine calcium channel blockers. Continue to have significant pain.     Assessment and Plan: * NSTEMI Patient with risk factors of HLD, elevated BP w/o dx HTN, family hx CAD/MI was seen at Dr. Alben Spittle office today with c/o chest pain. He was transferred to [facility]ED. Initial set troponins with mild elevation at  39,48.  And then started trending up 4000+. Taken for cardiac catheterization with PCI on 03/03/2022. -Continue DAPT with aspirin and ticagrelor. -Continue with  atorvastatin  Sinus arrest Patient developed sinus arrest following PCI, spontaneously resumed pulse. Cardiology is recommending avoid beta-blocker and nondihydropyridine calcium channel blocker. -Continue to monitor  Groin hematoma Patient developed groin hematoma after the surgery.  Slight decrease in hemoglobin.  Hematoma seems stable with some improvement in bruising. Duplex scan was negative for pseudoaneurysm. -Continue to monitor  Elevated blood-pressure reading without diagnosis of hypertension BP within goal today.  Coreg daily  Cardiology following  Esophageal reflux Patient reports long standing h/o GERD for which he took PPI for years. He has stopped medication and has done well. During exam he c/o reflux type discomfort.  Maalox 30 cc q4 prn  Protonix 40 mg daily  To discuss long term mgt with his primary physician.   Hypercholesteremia By report patient had stopped taking statin  Switched to higher potency Statin therapy   Subjective: Patient continues to have significant right groin pain.  No chest pain or shortness of breath  Physical Exam: Vitals:   03/04/22 1500 03/04/22 1600 03/04/22 1700 03/04/22 1800  BP: 113/63 130/67 (!) 109/59 121/72  Pulse: 70 72 65 71  Resp: (!) '21 16 18 20  '$ Temp:  98.3 F (36.8 C)    TempSrc:  Oral    SpO2: 97% 97% 97% 97%  Weight:      Height:       General.     In no acute distress. Pulmonary.  Lungs clear bilaterally, normal respiratory effort. CV.  Regular rate and rhythm, no JVD, rub or murmur. Abdomen.  Soft, nontender, nondistended, BS positive. CNS.  Alert and oriented .  No focal neurologic deficit. Extremities.  No edema, no cyanosis, pulses intact and symmetrical.  Right groin bruising with stable hematoma. Psychiatry.  Judgment and insight appears normal.  Data Reviewed: Prior data reviewed.  Family Communication: Wife at bedside  Disposition: Status is: Inpatient Remains inpatient appropriate  because: Severity of illness   Planned Discharge Destination: Home  Time spent: 50 minutes  This record has been created using Systems analyst. Errors have been sought and corrected,but may not always be located. Such creation errors do not reflect on the standard of care.  Author: Lorella Nimrod, MD 03/04/2022 7:05 PM  For on call review [email or web address].

## 2022-03-04 NOTE — TOC Benefit Eligibility Note (Signed)
Patient Teacher, English as a foreign language completed.    The patient is currently admitted and upon discharge could be taking Brilinta 90 mg.  The current 30 day co-pay is, $35.00.   The patient is insured through [place] Patient Advocate Specialist [place] Patient Advocate Team Direct Number: [phone]  Fax: [phone]

## 2022-03-04 NOTE — Assessment & Plan Note (Signed)
Patient developed groin hematoma after the surgery.  Slight decrease in hemoglobin.  Hematoma seems stable with some improvement in bruising. Duplex scan was negative for pseudoaneurysm. -Continue to monitor

## 2022-03-05 DIAGNOSIS — I249 Acute ischemic heart disease, unspecified: Secondary | ICD-10-CM | POA: Diagnosis not present

## 2022-03-05 LAB — CBC
HCT: 36.7 % — ABNORMAL LOW (ref 39.0–52.0)
Hemoglobin: 12.4 g/dL — ABNORMAL LOW (ref 13.0–17.0)
MCH: 31.4 pg (ref 26.0–34.0)
MCHC: 33.8 g/dL (ref 30.0–36.0)
MCV: 92.9 fL (ref 80.0–100.0)
Platelets: 181 10*3/uL (ref 150–400)
RBC: 3.95 MIL/uL — ABNORMAL LOW (ref 4.22–5.81)
RDW: 12.5 % (ref 11.5–15.5)
WBC: 5.9 10*3/uL (ref 4.0–10.5)
nRBC: 0 % (ref 0.0–0.2)

## 2022-03-05 LAB — LIPOPROTEIN A (LPA): Lipoprotein (a): 192.3 nmol/L — ABNORMAL HIGH (ref <75.0)

## 2022-03-05 MED ORDER — ASPIRIN 81 MG PO TBEC: 81.0000 mg | DELAYED_RELEASE_TABLET | Freq: Every day | ORAL | 12 refills | Status: DC

## 2022-03-05 MED ORDER — ATORVASTATIN CALCIUM 80 MG PO TABS: 80.0000 mg | ORAL_TABLET | Freq: Every evening | ORAL | 1 refills | Status: DC

## 2022-03-05 MED ORDER — TICAGRELOR 90 MG PO TABS: 90.0000 mg | ORAL_TABLET | Freq: Two times a day (BID) | ORAL | 3 refills | Status: DC

## 2022-03-05 NOTE — Progress Notes (Signed)
SUBJECTIVE: Richard Warren is a 66 y.o. male with past medical history of hyperlipidemia and GERD who presented to the ED complaining of chest pain.     Patient underwent cardiac catheterization on 03/03/22. Left main was unremarkable LAD had mild irregularities.  First diagonal is a very large vessel which has proximal 99% lesion.  Left circumflex has mid to distal 99% lesion.  RCA itself is free of disease but PLV branch at the ostium has 50% disease.  PDA of branch of RCA distally has about 50% disease also.  Left ventricular ejection fraction was preserved approximately 60%.   PCI completed by Dr. Saunders Revel.   Successful complex bifurcation PCI to large D2 branch using a mini-crush technique with Onyx Frontier 2.25 x 18 mm (main branch) and 2.0 x 22 mm (sidebranch) drug-eluting stents with 0% residual stenosis and TIMI-3 flow. Successful PCI to distal LCx using Onyx Frontier 2.5 x 18 mm drug-eluting stent with 0% residual stenosis and TIMI-3 flow.   Patient experienced syncopal episode post procedure. Patient felt faint, bradycardic, sinus pause/asystole lasting approximately 12 seconds. Was admitted to ICU for further monitoring.    Vitals:   03/04/22 1800 03/04/22 2000 03/05/22 0000 03/05/22 0800  BP: 121/72 112/68 110/76 112/65  Pulse: 71 70 81 71  Resp: 20 (!) '22 20 16  '$ Temp:  98.4 F (36.9 C) 98.2 F (36.8 C) 98.2 F (36.8 C)  TempSrc:  Oral Oral Oral  SpO2: 97% 96% 96% 96%  Weight:      Height:        Intake/Output Summary (Last 24 hours) at 03/05/2022 0927 Last data filed at 03/04/2022 1600 Gross per 24 hour  Intake --  Output 450 ml  Net -450 ml    LABS: Basic Metabolic Panel: Recent Labs    03/03/22 0546 03/04/22 0512  NA 140 140  K 3.6 3.7  CL 110 112*  CO2 24 22  GLUCOSE 110* 107*  BUN 11 13  CREATININE 0.73 0.78  CALCIUM 8.6* 8.5*  MG 2.0  --    Liver Function Tests: No results for input(s): "AST", "ALT", "ALKPHOS", "BILITOT", "PROT", "ALBUMIN" in the  last 72 hours. No results for input(s): "LIPASE", "AMYLASE" in the last 72 hours. CBC: Recent Labs    03/04/22 0512 03/05/22 0323  WBC 6.6 5.9  HGB 12.8* 12.4*  HCT 37.6* 36.7*  MCV 92.8 92.9  PLT 183 181   Cardiac Enzymes: No results for input(s): "CKTOTAL", "CKMB", "CKMBINDEX", "TROPONINI" in the last 72 hours. BNP: Invalid input(s): "POCBNP" D-Dimer: No results for input(s): "DDIMER" in the last 72 hours. Hemoglobin A1C: No results for input(s): "HGBA1C" in the last 72 hours. Fasting Lipid Panel: Recent Labs    03/04/22 0512  CHOL 166  HDL 34*  LDLCALC 107*  TRIG 123  CHOLHDL 4.9   Thyroid Function Tests: No results for input(s): "TSH", "T4TOTAL", "T3FREE", "THYROIDAB" in the last 72 hours.  Invalid input(s): "FREET3" Anemia Panel: No results for input(s): "VITAMINB12", "FOLATE", "FERRITIN", "TIBC", "IRON", "RETICCTPCT" in the last 72 hours.   PHYSICAL EXAM General: Well developed, well nourished, in no acute distress HEENT:  Normocephalic and atramatic Neck:  No JVD.  Lungs: Clear bilaterally to auscultation and percussion. Heart: HRRR . Normal S1 and S2 without gallops or murmurs.  Abdomen: Bowel sounds are positive, abdomen soft and non-tender  Msk:  Back normal, normal gait. Normal strength and tone for age. Extremities: No clubbing, cyanosis or edema.   Neuro: Alert and oriented X 3.  Psych:  Good affect, responds appropriately  TELEMETRY: sinus rhythm, HR 70 bpm  ASSESSMENT AND PLAN: Patient resting in bed. Denies chest pain, shortness of breath. Patient developed hematoma post cardiac cath. Pain improving. Right groin ultrasound performed 03/04/22 showed no evidence of pseudoaneurysm. Patient sitting up in bed, wanting to get up and walk. Patient can be discharged home this afternoon. Patient to follow up in office on Monday, 03/09/22 at 10:00 am.   Principal Problem:   NSTEMI Active Problems:   Esophageal reflux   Elevated blood-pressure reading  without diagnosis of hypertension   Hypercholesteremia   Sinus arrest   Groin hematoma    Deanna Wiater, FNP-C 03/05/2022 9:27 AM

## 2022-03-05 NOTE — Plan of Care (Signed)

## 2022-03-05 NOTE — Discharge Summary (Signed)
Physician Discharge Summary   Patient: Richard Warren MRN: [id number] DOB: 04-Aug-1956  Admit date:     03/02/2022  Discharge date: 03/05/22  Discharge Physician: Lorella Nimrod   PCP: Jerrol Banana., MD   Recommendations at discharge:  Please obtain CBC and BMP in 1 week Follow-up with cardiology within a week Follow-up with primary care provider  Discharge Diagnoses: Principal Problem:   NSTEMI Active Problems:   Sinus arrest   Groin hematoma   Elevated blood-pressure reading without diagnosis of hypertension   Esophageal reflux   Hypercholesteremia  Resolved Problems:   Benign prostatic hyperplasia with urinary hesitancy   CAD (coronary artery disease)   Non-ST elevation (NSTEMI) myocardial infarction [facility])  Hospital Course: Taken from prior notes.  Richard Warren, a 66 y/o, with h/o HLD, GERD and positive family hx for CAD/MI presented to his doctor's office 06/26 with c/o  central chest pain starting at 0800 hrs. Pain did radiate to his left arm but denies SOB or diaphoresis. He had an EKG which was worrisome. Patient was given ASA and NTG which did relieve his pain but lead to a syncopal episode. EMS transported patient to [facility] for evaluation.  06/26: afebrile 144/71  95  RR 18. NAD. Lab: glucose 118, CBCD nl, Troponins 39 to 48. EKG w/o acute changes. EDP consulted cardiology. TR[facility] called to admit for continued workup for atypical chest pain. Heparin gtt.  06/27: troponin trending up. Cath: PCI to diagonal and LCx successful. Plan for long-term DAPT. Per Dr. Saunders Revel, if remains chest-pain free can discharge tomorrow 06/28. 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 Slight decrease in hemoglobin to 12.8.  Hematoma now stable. Doppler studies which were ordered by cardiology were negative for any pseudoaneurysm. History of sinus  arrest following catheterization-cardiology is recommending no beta-blocker and nondihydropyridine calcium channel blockers. Continue to have significant pain.    Assessment and Plan: * NSTEMI Patient with risk factors of HLD, elevated BP w/o dx HTN, family hx CAD/MI was seen at Dr. Alben Spittle office today with c/o chest pain. He was transferred to [facility]. Initial set troponins with mild elevation at  39,48.  And then started trending up 4000+. Taken for cardiac catheterization with PCI on 03/03/2022. -Continue DAPT with aspirin and ticagrelor. -Continue with atorvastatin  Sinus arrest Patient developed sinus arrest following PCI, spontaneously resumed pulse. Cardiology is recommending avoid beta-blocker and nondihydropyridine calcium channel blocker. -Continue to monitor  Groin hematoma Patient developed groin hematoma after the surgery.  Slight decrease in hemoglobin.  Hematoma seems stable with some improvement in bruising. Duplex scan was negative for pseudoaneurysm. -Continue to monitor  Elevated blood-pressure reading without diagnosis of hypertension BP within goal today. Coreg daily Cardiology following  Esophageal reflux Patient reports long standing h/o GERD for which he took PPI for years. He has stopped medication and has done well. During exam he c/o reflux type discomfort. Maalox 30 cc q4 prn Protonix 40 mg daily To discuss long term mgt with his primary physician.   Hypercholesteremia By report patient had stopped taking statin Switched to higher potency Statin therapy   Consultants: Cardiology Procedures performed: Cardiac catheterization with PCI Disposition: Home Diet recommendation:  Discharge Diet Orders (From admission, onward)     Start     Ordered   03/05/22 0000  Diet - low sodium heart healthy  03/05/22 1240           Cardiac and Carb modified diet DISCHARGE MEDICATION: Allergies as of 03/05/2022       Reactions   Zolpidem     Depression        Medication List     STOP taking these medications    lovastatin 40 MG tablet Commonly known as: MEVACOR       TAKE these medications    ACIDOPHILUS PO Take by mouth.   aspirin EC 81 MG tablet Take 1 tablet (81 mg total) by mouth daily. Swallow whole. Start taking on: March 06, 2022   atorvastatin 80 MG tablet Commonly known as: LIPITOR Take 1 tablet (80 mg total) by mouth every evening.   CINNAMON PO Take by mouth.   CO Q 10 PO Take by mouth.   GARLIC PO Take by mouth.   MEGA BASIC PO Take by mouth.   Milk Thistle 250 MG Caps Take by mouth.   OMEGA 3 PO Take 3,000 mg by mouth 2 (two) times daily.   Selenium 200 MCG Caps Take by mouth daily.   ticagrelor 90 MG Tabs tablet Commonly known as: BRILINTA Take 1 tablet (90 mg total) by mouth 2 (two) times daily.   VITAMIN B12 PO Take by mouth daily.   VITAMIN D-3 PO Take by mouth.   vitamin E 180 MG (400 UNITS) capsule Take 400 Units by mouth daily.   VITAMIN-B COMPLEX PO Take by mouth.               Discharge Care Instructions  (From admission, onward)           Start     Ordered   03/05/22 0000  No dressing needed        03/05/22 1240            Discharge Exam: Filed Weights   03/02/22 1219 03/03/22 1420 03/03/22 1950  Weight: 95.3 kg 95.3 kg 95.3 kg   General.     In no acute distress. Pulmonary.  Lungs clear bilaterally, normal respiratory effort. CV.  Regular rate and rhythm, no JVD, rub or murmur. Abdomen.  Soft, nontender, nondistended, BS positive. CNS.  Alert and oriented .  No focal neurologic deficit. Extremities.  No edema, no cyanosis, pulses intact and symmetrical. Psychiatry.  Judgment and insight appears normal.   Condition at discharge: stable  The results of significant diagnostics from this hospitalization (including imaging, microbiology, ancillary and laboratory) are listed below for reference.   Imaging Studies: [place] Lower Ext  Art Right Ltd  Result Date: 03/04/2022 CLINICAL DATA:  66 year old male with concern for pseudoaneurysm at right groin puncture site. EXAM: LOWER EXTREMITY ARTERIAL DUPLEX SCAN TECHNIQUE: Gray-scale sonography as well as color Doppler and duplex ultrasound was performed to evaluate the lower extremity arteries including the common, superficial and profunda femoral arteries, popliteal artery and calf arteries. COMPARISON:  None Available. FINDINGS: Right lower Extremity Widely patent visualized common femoral artery with normal triphasic arterial waveform. The common femoral vein is patent and compressible. No evidence of significant hematoma or pseudoaneurysm. IMPRESSION: No evidence of right groin pseudoaneurysm. Electronically Signed   By: Ruthann Cancer M.D.   On: 03/04/2022 09:41   CARDIAC CATHETERIZATION  Result Date: 03/03/2022 Conclusions: Severe two-vessel coronary artery disease involving large D2 and distal LCx.  See Dr. Laurelyn Sickle diagnostic catheterization report for further details. Successful complex bifurcation PCI to large D2 branch using a mini-crush technique with Onyx Frontier 2.25 x  18 mm (main branch) and 2.0 x 22 mm (sidebranch) drug-eluting stents with 0% residual stenosis and TIMI-3 flow. Successful PCI to distal LCx using Onyx Frontier 2.5 x 18 mm drug-eluting stent with 0% residual stenosis and TIMI-3 flow. Recommendations: Dual antiplatelet therapy with aspirin and ticagrelor for at least 12 months, ideally longer given complex bifurcation stenting of D2. Aggressive secondary prevention of coronary artery disease. Post catheterization hydration given significant contrast exposure during diagnostic and interventional procedure. Nelva Bush, MD [facility]  CARDIAC CATHETERIZATION  Result Date: 03/03/2022   RPAV lesion is 50% stenosed.   RPDA lesion is 50% stenosed.   Mid Cx lesion is 99% stenosed.   1st Diag lesion is 99% stenosed.   The left ventricular systolic function is  normal.   LV end diastolic pressure is moderately elevated. High-grade lesion 99% large first diagonal and 99% mid to distal left circumflex disease with mild irregularities in the LAD.  RCA itself has no significant disease but PLV branch has 50% ostial disease and distal PDA has 50% disease with normal ejection fraction approximately 60%.  Patient will have PCI of the left circumflex and diagonal and will be staying here overnight.   ECHOCARDIOGRAM COMPLETE  Result Date: 03/03/2022    ECHOCARDIOGRAM REPORT   Patient Name:   Richard Warren Date of Exam: 03/03/2022 Medical Rec #:  [id number]         Height:       69.0 in Accession #:    [id number]        Weight:       210.0 lb Date of Birth:  April 14, 1956         BSA:          2.109 m Patient Age:    70 years          BP:           129/79 mmHg Patient Gender: M                 HR:           64 bpm. Exam Location:  [facility] Procedure: 2D Echo, Cardiac Doppler and Color Doppler Indications:     Chest pain R07.9  History:         Patient has no prior history of Echocardiogram examinations.                  Risk Factors:Dyslipidemia.  Sonographer:     Sherrie Sport Referring Phys:  [id number] AMBER SCOGGINS Diagnosing Phys: Neoma Laming  Sonographer Comments: Suboptimal apical window and no subcostal window. IMPRESSIONS  1. Left ventricular ejection fraction, by estimation, is 55 to 60%. The left ventricle has normal function. The left ventricle has no regional wall motion abnormalities. There is mild concentric left ventricular hypertrophy. Left ventricular diastolic parameters are consistent with Grade I diastolic dysfunction (impaired relaxation).  2. Right ventricular systolic function is normal. The right ventricular size is normal.  3. The mitral valve is normal in structure. Trivial mitral valve regurgitation. No evidence of mitral stenosis.  4. The aortic valve is normal in structure. Aortic valve regurgitation is not visualized. No aortic stenosis is present.  5. The  inferior vena cava is normal in size with greater than 50% respiratory variability, suggesting right atrial pressure of 3 mmHg. FINDINGS  Left Ventricle: Left ventricular ejection fraction, by estimation, is 55 to 60%. The left ventricle has normal function. The left ventricle has no regional wall motion abnormalities. The left  ventricular internal cavity size was normal in size. There is  mild concentric left ventricular hypertrophy. Left ventricular diastolic parameters are consistent with Grade I diastolic dysfunction (impaired relaxation). Right Ventricle: The right ventricular size is normal. No increase in right ventricular wall thickness. Right ventricular systolic function is normal. Left Atrium: Left atrial size was normal in size. Right Atrium: Right atrial size was normal in size. Pericardium: There is no evidence of pericardial effusion. Mitral Valve: The mitral valve is normal in structure. Trivial mitral valve regurgitation. No evidence of mitral valve stenosis. Tricuspid Valve: The tricuspid valve is normal in structure. Tricuspid valve regurgitation is not demonstrated. No evidence of tricuspid stenosis. Aortic Valve: The aortic valve is normal in structure. Aortic valve regurgitation is not visualized. No aortic stenosis is present. Aortic valve mean gradient measures 1.0 mmHg. Aortic valve peak gradient measures 2.7 mmHg. Aortic valve area, by VTI measures 2.81 cm. Pulmonic Valve: The pulmonic valve was normal in structure. Pulmonic valve regurgitation is not visualized. No evidence of pulmonic stenosis. Aorta: The aortic root is normal in size and structure. Venous: The inferior vena cava is normal in size with greater than 50% respiratory variability, suggesting right atrial pressure of 3 mmHg. IAS/Shunts: No atrial level shunt detected by color flow Doppler.  LEFT VENTRICLE PLAX 2D LVIDd:         4.30 cm   Diastology LVIDs:         3.00 cm   LV e' medial:    6.42 cm/s LV PW:         0.80 cm    LV E/e' medial:  10.4 LV IVS:        1.30 cm   LV e' lateral:   6.53 cm/s LVOT diam:     2.10 cm   LV E/e' lateral: 10.2 LV SV:         36 LV SV Index:   17 LVOT Area:     3.46 cm  RIGHT VENTRICLE RV S prime:     9.90 cm/s TAPSE (M-mode): 2.2 cm LEFT ATRIUM             Index        RIGHT ATRIUM           Index LA Vol (A2C):   23.9 ml 11.33 ml/m  RA Area:     16.70 cm LA Vol (A4C):   29.7 ml 14.08 ml/m  RA Volume:   47.40 ml  22.47 ml/m LA Biplane Vol: 27.3 ml 12.94 ml/m  AORTIC VALVE AV Area (Vmax):    2.49 cm AV Area (Vmean):   2.74 cm AV Area (VTI):     2.81 cm AV Vmax:           81.80 cm/s AV Vmean:          48.000 cm/s AV VTI:            0.128 m AV Peak Grad:      2.7 mmHg AV Mean Grad:      1.0 mmHg LVOT Vmax:         58.70 cm/s LVOT Vmean:        38.000 cm/s LVOT VTI:          0.104 m LVOT/AV VTI ratio: 0.81  AORTA Ao Root diam: 3.07 cm MITRAL VALVE               TRICUSPID VALVE MV Area (PHT): 3.89 cm    TR Peak grad:  13.2 mmHg MV Decel Time: 195 msec    TR Vmax:        182.00 cm/s MV E velocity: 66.80 cm/s MV A velocity: 75.40 cm/s  SHUNTS MV E/A ratio:  0.89        Systemic VTI:  0.10 m                            Systemic Diam: 2.10 cm Neoma Laming Electronically signed by Neoma Laming Signature Date/Time: 03/03/2022/1:34:15 PM    Final    DG Chest 2 View  Result Date: 03/02/2022 CLINICAL DATA:  Chest pain radiating into the left arm. EXAM: CHEST - 2 VIEW COMPARISON:  Radiographs 03/30/2007. FINDINGS: The heart size and mediastinal contours are stable. The lungs are clear. There is no pleural effusion or pneumothorax. Mild degenerative changes in the spine without acute osseous abnormality. IMPRESSION: Stable chest.  No active cardiopulmonary process. Electronically Signed   By: Richardean Sale M.D.   On: 03/02/2022 12:56    Microbiology: Results for orders placed or performed during the hospital encounter of 03/02/22  MRSA Next Gen by PCR, Nasal     Status: None   Collection Time:  03/03/22  7:58 PM   Specimen: Nasal Mucosa; Nasal Swab  Result Value Ref Range Status   MRSA by PCR Next Gen NOT DETECTED NOT DETECTED Final    Comment: (NOTE) The GeneXpert MRSA Assay (FDA approved for NASAL specimens only), is one component of a comprehensive MRSA colonization surveillance program. It is not intended to diagnose MRSA infection nor to guide or monitor treatment for MRSA infections. Test performance is not FDA approved in patients less than 17 years old. Performed at [facility], [address] [place], [place] [address]     Labs: CBC: Recent Labs  Lab 03/02/22 1222 03/03/22 0546 03/03/22 2216 03/04/22 0512 03/05/22 0323  WBC 5.1 4.9 7.4 6.6 5.9  HGB 15.3 14.3 13.5 12.8* 12.4*  HCT 45.4 42.3 40.1 37.6* 36.7*  MCV 92.1 91.6 92.4 92.8 92.9  PLT 241 205 201 183 621   Basic Metabolic Panel: Recent Labs  Lab 03/02/22 1222 03/03/22 0546 03/04/22 0512  NA 141 140 140  K 3.8 3.6 3.7  CL 110 110 112*  CO2 '22 24 22  '$ GLUCOSE 118* 110* 107*  BUN '11 11 13  '$ CREATININE 0.68 0.73 0.78  CALCIUM 9.0 8.6* 8.5*  MG  --  2.0  --    Liver Function Tests: No results for input(s): "AST", "ALT", "ALKPHOS", "BILITOT", "PROT", "ALBUMIN" in the last 168 hours. CBG: Recent Labs  Lab 03/03/22 1942  GLUCAP 187*    Discharge time spent: greater than 30 minutes.  This record has been created using Systems analyst. Errors have been sought and corrected,but may not always be located. Such creation errors do not reflect on the standard of care.   Signed: Lorella Nimrod, MD [facility] 03/05/2022

## 2022-03-05 NOTE — Progress Notes (Signed)
Pt discharged from facility, vitals WNL, all lines removed, Alert and oriented, all belongings sent with pt (clothing and phone) all questions and concerns addressed and answered.

## 2022-03-09 ENCOUNTER — Encounter: Payer: Self-pay | Admitting: Family Medicine

## 2022-03-09 ENCOUNTER — Ambulatory Visit (INDEPENDENT_AMBULATORY_CARE_PROVIDER_SITE_OTHER): Payer: Managed Care, Other (non HMO) | Admitting: Family Medicine

## 2022-03-09 VITALS — BP 117/83 | HR 94 | Temp 99.6°F | Resp 16 | Ht 69.0 in | Wt 203.0 lb

## 2022-03-09 DIAGNOSIS — Z87442 Personal history of urinary calculi: Secondary | ICD-10-CM

## 2022-03-09 DIAGNOSIS — I249 Acute ischemic heart disease, unspecified: Secondary | ICD-10-CM | POA: Diagnosis not present

## 2022-03-09 DIAGNOSIS — S301XXA Contusion of abdominal wall, initial encounter: Secondary | ICD-10-CM | POA: Diagnosis not present

## 2022-03-09 DIAGNOSIS — R079 Chest pain, unspecified: Secondary | ICD-10-CM

## 2022-03-09 DIAGNOSIS — E7800 Pure hypercholesterolemia, unspecified: Secondary | ICD-10-CM

## 2022-03-09 NOTE — Progress Notes (Unsigned)
I,[name],acting as a scribe for Richard Durie, MD.,have documented all relevant documentation on the behalf of Richard Durie, MD,as directed by  Richard Durie, MD while in the presence of Richard Durie, MD.   Established patient visit   Patient: Richard Warren   DOB: 01-29-1956   66 y.o. Male  MRN: [id number] Visit Date: 03/09/2022  Today's healthcare provider: [name], MD   Chief Complaint  Patient presents with   Hospitalization Follow-up   Subjective    HPI  Patient was admitted with chest pain and ruled in for MI.  He had subsequent cath and PCI to a diagonal branch and left circumflex.  He has done well since then. The hematoma in this right groin continues to be uncomfortable but he is stable overall. He works as a Administrator. Follow up Hospitalization  Patient was admitted to [facility] on 03/02/2022 and discharged on 03/05/2022. He was treated for NSTEMI. Treatment for this included cardiac catheterization with PCI on 03/03/2022. Telephone follow up was done on N/A He reports excellent compliance with treatment. He reports this condition is  improving . Reports that he had a mild fever yesterday. Recommended to obtain CBC and BMP in 1 week. Patient was just seen this morning by Cardio. ----------------------------------------------------------------------------------------- -   Medications: Outpatient Medications Prior to Visit  Medication Sig   aspirin EC 81 MG tablet Take 1 tablet (81 mg total) by mouth daily. Swallow whole.   atorvastatin (LIPITOR) 80 MG tablet Take 1 tablet (80 mg total) by mouth every evening.   B Complex Vitamins (VITAMIN-B COMPLEX PO) Take by mouth.   Cholecalciferol (VITAMIN D-3 PO) Take by mouth.   CINNAMON PO Take by mouth.   Coenzyme Q10 (CO Q 10 PO) Take by mouth.   Cyanocobalamin (VITAMIN B12 PO) Take by mouth daily.   GARLIC PO Take by mouth.   Lactobacillus (ACIDOPHILUS PO) Take by mouth.    Milk Thistle 250 MG CAPS Take by mouth.   Multiple Vitamins-Minerals (MEGA BASIC PO) Take by mouth.   Omega-3 Fatty Acids (OMEGA 3 PO) Take 3,000 mg by mouth 2 (two) times daily.   Selenium 200 MCG CAPS Take by mouth daily.   ticagrelor (BRILINTA) 90 MG TABS tablet Take 1 tablet (90 mg total) by mouth 2 (two) times daily.   vitamin E 400 UNIT capsule Take 400 Units by mouth daily.   No facility-administered medications prior to visit.    Review of Systems  Last lipids Lab Results  Component Value Date   CHOL 166 03/04/2022   HDL 34 (L) 03/04/2022   LDLCALC 107 (H) 03/04/2022   TRIG 123 03/04/2022   CHOLHDL 4.9 03/04/2022       Objective    BP 117/83 (BP Location: Right Arm, Patient Position: Sitting, Cuff Size: Large)   Pulse 94   Temp 99.6 F (37.6 C) (Oral)   Resp 16   Ht 5' 9"  (1.753 m)   Wt 203 lb (92.1 kg)   BMI 29.98 kg/m  BP Readings from Last 3 Encounters:  03/09/22 117/83  03/05/22 118/62  03/02/22 (!) 160/95   Wt Readings from Last 3 Encounters:  03/09/22 203 lb (92.1 kg)  03/03/22 210 lb 1.6 oz (95.3 kg)  10/23/19 215 lb (97.5 kg)      Physical Exam Vitals reviewed.  Constitutional:      General: He is not in acute distress.    Appearance: He is well-developed.  HENT:     Head: Normocephalic and atraumatic.     Right Ear: Hearing normal.     Left Ear: Hearing normal.     Nose: Nose normal.  Eyes:     General: Lids are normal. No scleral icterus.       Right eye: No discharge.        Left eye: No discharge.     Conjunctiva/sclera: Conjunctivae normal.  Cardiovascular:     Rate and Rhythm: Normal rate and regular rhythm.     Pulses: Normal pulses.     Heart sounds: Normal heart sounds.     Comments: Right groin has a lot of ecchymosis and there is a small hematoma size of a golf ball in the middle.  It is tender with no drainage in area. Pulmonary:     Effort: Pulmonary effort is normal. No respiratory distress.     Breath sounds: Normal  breath sounds.  Skin:    Findings: No lesion or rash.  Neurological:     General: No focal deficit present.     Mental Status: He is alert and oriented to person, place, and time.  Psychiatric:        Mood and Affect: Mood normal.        Speech: Speech normal.        Behavior: Behavior normal.        Thought Content: Thought content normal.        Judgment: Judgment normal.       No results found for any visits on 03/09/22.  Assessment & Plan     1. NSTEMI/CAD Patient is status post PCI to left circumflex into diagonal. All risk factors presently treated.  He has been on a statin about a week now Follow-up with cardiology 2. Chest pain, unspecified type Completely resolved. - CBC with Differential/Platelet - Comprehensive metabolic panel  3. Hematoma of groin, initial encounter Follow-up with cardiology regarding hematoma when he can go back to work  4. Personal history of kidney stones   5. Hypercholesteremia Follow-up in a few weeks with lipids and Met C   No follow-ups on file.      I, Richard Durie, MD, have reviewed all documentation for this visit. The documentation on 03/11/22 for the exam, diagnosis, procedures, and orders are all accurate and complete.    Leiah Giannotti Cranford Mon, MD  [facility] [phone] (phone) [phone] (fax)  [place]

## 2022-03-10 LAB — COMPREHENSIVE METABOLIC PANEL
ALT: 92 IU/L — ABNORMAL HIGH (ref 0–44)
AST: 38 IU/L (ref 0–40)
Albumin/Globulin Ratio: 1.5 (ref 1.2–2.2)
Albumin: 4.1 g/dL (ref 3.8–4.8)
Alkaline Phosphatase: 51 IU/L (ref 44–121)
BUN/Creatinine Ratio: 13 (ref 10–24)
BUN: 12 mg/dL (ref 8–27)
Bilirubin Total: 1.2 mg/dL (ref 0.0–1.2)
CO2: 20 mmol/L (ref 20–29)
Calcium: 9.2 mg/dL (ref 8.6–10.2)
Chloride: 102 mmol/L (ref 96–106)
Creatinine, Ser: 0.92 mg/dL (ref 0.76–1.27)
Globulin, Total: 2.7 g/dL (ref 1.5–4.5)
Glucose: 164 mg/dL — ABNORMAL HIGH (ref 70–99)
Potassium: 4.1 mmol/L (ref 3.5–5.2)
Sodium: 140 mmol/L (ref 134–144)
Total Protein: 6.8 g/dL (ref 6.0–8.5)
eGFR: 93 mL/min/{1.73_m2} (ref >59)

## 2022-03-10 LAB — CBC WITH DIFFERENTIAL/PLATELET
Basophils Absolute: 0 10*3/uL (ref 0.0–0.2)
Basos: 0 %
EOS (ABSOLUTE): 0 10*3/uL (ref 0.0–0.4)
Eos: 1 %
Hematocrit: 40.9 % (ref 37.5–51.0)
Hemoglobin: 13.7 g/dL (ref 13.0–17.7)
Immature Grans (Abs): 0 10*3/uL (ref 0.0–0.1)
Immature Granulocytes: 0 %
Lymphocytes Absolute: 1.2 10*3/uL (ref 0.7–3.1)
Lymphs: 21 %
MCH: 31.6 pg (ref 26.6–33.0)
MCHC: 33.5 g/dL (ref 31.5–35.7)
MCV: 94 fL (ref 79–97)
Monocytes Absolute: 0.5 10*3/uL (ref 0.1–0.9)
Monocytes: 9 %
Neutrophils Absolute: 4 10*3/uL (ref 1.4–7.0)
Neutrophils: 69 %
Platelets: 255 10*3/uL (ref 150–450)
RBC: 4.34 x10E6/uL (ref 4.14–5.80)
RDW: 12.5 % (ref 11.6–15.4)
WBC: 5.8 10*3/uL (ref 3.4–10.8)

## 2022-03-11 ENCOUNTER — Telehealth: Payer: Self-pay

## 2022-03-11 NOTE — Telephone Encounter (Signed)
Patient advised of stable lab results.

## 2022-03-11 NOTE — Telephone Encounter (Signed)
Copied from [place] [phone]. Topic: General - Other >> Mar 11, 2022  3:58 PM Marcellus Scott wrote: Reason for CRM: Pt wife requesting a call back to discuss labs.

## 2022-03-30 ENCOUNTER — Encounter: Payer: Self-pay | Admitting: *Deleted

## 2022-03-30 ENCOUNTER — Encounter: Payer: Managed Care, Other (non HMO) | Attending: Cardiovascular Disease | Admitting: *Deleted

## 2022-03-30 DIAGNOSIS — I214 Non-ST elevation (NSTEMI) myocardial infarction: Secondary | ICD-10-CM

## 2022-03-30 DIAGNOSIS — Z955 Presence of coronary angioplasty implant and graft: Secondary | ICD-10-CM

## 2022-03-30 NOTE — Progress Notes (Signed)
Virtual orientation call completed today. he has an appointment on Date: 04/09/2022  for EP eval and gym Orientation.  Documentation of diagnosis can be found in [facility] Date: 03/02/2022 .

## 2022-04-09 ENCOUNTER — Encounter: Payer: Managed Care, Other (non HMO) | Attending: Cardiovascular Disease

## 2022-04-09 VITALS — Ht 69.0 in | Wt 202.0 lb

## 2022-04-09 DIAGNOSIS — Z955 Presence of coronary angioplasty implant and graft: Secondary | ICD-10-CM | POA: Diagnosis not present

## 2022-04-09 DIAGNOSIS — I214 Non-ST elevation (NSTEMI) myocardial infarction: Secondary | ICD-10-CM | POA: Insufficient documentation

## 2022-04-09 NOTE — Patient Instructions (Signed)
Patient Instructions  Patient Details  Name: Richard Warren MRN: [id number] Date of Birth: 09-11-1955 Referring Provider:  Dionisio David, MD  Below are your personal goals for exercise, nutrition, and risk factors. Our goal is to help you stay on track towards obtaining and maintaining these goals. We will be discussing your progress on these goals with you throughout the program.  Initial Exercise Prescription:  Initial Exercise Prescription - 04/09/22 1600       Date of Initial Exercise RX and Referring Provider   Date 04/09/22    Referring Provider Neoma Laming MD      Treadmill   MPH 2.6    Grade 1    Minutes 15    METs 3.35      NuStep   Level 3    SPM 80    Minutes 15    METs 3.3      REL-XR   Level 2    Speed 50    Minutes 15    METs 3.3      Prescription Details   Frequency (times per week) 2    Duration Progress to 30 minutes of continuous aerobic without signs/symptoms of physical distress      Intensity   THRR 40-80% of Max Heartrate 103 - 138    Ratings of Perceived Exertion 11-13    Perceived Dyspnea 0-4      Progression   Progression Continue to progress workloads to maintain intensity without signs/symptoms of physical distress.      Resistance Training   Training Prescription Yes    Weight 5 lb    Reps 10-15             Exercise Goals: Frequency: Be able to perform aerobic exercise two to three times per week in program working toward 2-5 days per week of home exercise.  Intensity: Work with a perceived exertion of 11 (fairly light) - 15 (hard) while following your exercise prescription.  We will make changes to your prescription with you as you progress through the program.   Duration: Be able to do 30 to 45 minutes of continuous aerobic exercise in addition to a 5 minute warm-up and a 5 minute cool-down routine.   Nutrition Goals: Your personal nutrition goals will be established when you do your nutrition analysis with the  dietician.  The following are general nutrition guidelines to follow: Cholesterol < '200mg'$ /day Sodium < '1500mg'$ /day Fiber: Men over 50 yrs - 30 grams per day  Personal Goals:  Personal Goals and Risk Factors at Admission - 04/09/22 1619       Core Components/Risk Factors/Patient Goals on Admission    Weight Management Yes;Weight Loss    Intervention Weight Management: Develop a combined nutrition and exercise program designed to reach desired caloric intake, while maintaining appropriate intake of nutrient and fiber, sodium and fats, and appropriate energy expenditure required for the weight goal.;Weight Management: Provide education and appropriate resources to help participant work on and attain dietary goals.;Weight Management/Obesity: Establish reasonable short term and long term weight goals.    Admit Weight 202 lb (91.6 kg)    Goal Weight: Short Term 197 lb (89.4 kg)    Goal Weight: Long Term 180 lb (81.6 kg)   per patient   Expected Outcomes Short Term: Continue to assess and modify interventions until short term weight is achieved;Long Term: Adherence to nutrition and physical activity/exercise program aimed toward attainment of established weight goal;Weight Loss: Understanding of general recommendations for a balanced  deficit meal plan, which promotes 1-2 lb weight loss per week and includes a negative energy balance of [phone] kcal/d;Understanding recommendations for meals to include 15-35% energy as protein, 25-35% energy from fat, 35-60% energy from carbohydrates, less than '200mg'$  of dietary cholesterol, 20-35 gm of total fiber daily;Understanding of distribution of calorie intake throughout the day with the consumption of 4-5 meals/snacks    Lipids Yes    Intervention Provide education and support for participant on nutrition & aerobic/resistive exercise along with prescribed medications to achieve LDL '70mg'$ , HDL >'40mg'$ .    Expected Outcomes Short Term: Participant states understanding  of desired cholesterol values and is compliant with medications prescribed. Participant is following exercise prescription and nutrition guidelines.;Long Term: Cholesterol controlled with medications as prescribed, with individualized exercise RX and with personalized nutrition plan. Value goals: LDL < '70mg'$ , HDL > 40 mg.             Tobacco Use Initial Evaluation: Social History   Tobacco Use  Smoking Status Never  Smokeless Tobacco Never    Exercise Goals and Review:  Exercise Goals     Row Name 04/09/22 1618             Exercise Goals   Increase Physical Activity Yes       Intervention Provide advice, education, support and counseling about physical activity/exercise needs.;Develop an individualized exercise prescription for aerobic and resistive training based on initial evaluation findings, risk stratification, comorbidities and participant's personal goals.       Expected Outcomes Long Term: Add in home exercise to make exercise part of routine and to increase amount of physical activity.;Long Term: Exercising regularly at least 3-5 days a week.;Short Term: Attend rehab on a regular basis to increase amount of physical activity.       Increase Strength and Stamina Yes       Intervention Provide advice, education, support and counseling about physical activity/exercise needs.;Develop an individualized exercise prescription for aerobic and resistive training based on initial evaluation findings, risk stratification, comorbidities and participant's personal goals.       Expected Outcomes Short Term: Increase workloads from initial exercise prescription for resistance, speed, and METs.;Short Term: Perform resistance training exercises routinely during rehab and add in resistance training at home;Long Term: Improve cardiorespiratory fitness, muscular endurance and strength as measured by increased METs and functional capacity (6MWT)       Able to understand and use rate of perceived  exertion (RPE) scale Yes       Intervention Provide education and explanation on how to use RPE scale       Expected Outcomes Short Term: Able to use RPE daily in rehab to express subjective intensity level;Long Term:  Able to use RPE to guide intensity level when exercising independently       Able to understand and use Dyspnea scale Yes       Intervention Provide education and explanation on how to use Dyspnea scale       Expected Outcomes Short Term: Able to use Dyspnea scale daily in rehab to express subjective sense of shortness of breath during exertion;Long Term: Able to use Dyspnea scale to guide intensity level when exercising independently       Knowledge and understanding of Target Heart Rate Range (THRR) Yes       Intervention Provide education and explanation of THRR including how the numbers were predicted and where they are located for reference       Expected Outcomes Short Term: Able  to state/look up THRR;Long Term: Able to use THRR to govern intensity when exercising independently;Short Term: Able to use daily as guideline for intensity in rehab       Able to check pulse independently Yes       Intervention Provide education and demonstration on how to check pulse in carotid and radial arteries.;Review the importance of being able to check your own pulse for safety during independent exercise       Expected Outcomes Short Term: Able to explain why pulse checking is important during independent exercise;Long Term: Able to check pulse independently and accurately       Understanding of Exercise Prescription Yes       Intervention Provide education, explanation, and written materials on patient's individual exercise prescription       Expected Outcomes Short Term: Able to explain program exercise prescription;Long Term: Able to explain home exercise prescription to exercise independently                Copy of goals given to participant.

## 2022-04-09 NOTE — Progress Notes (Signed)
Cardiac Individual Treatment Plan  Patient Details  Name: Richard Warren MRN: [id number] Date of Birth: 17-Jul-1956 Referring Provider:   Flowsheet Row Cardiac Rehab from 04/09/2022 in [facility] Cardiac and Pulmonary Rehab  Referring Provider Neoma Laming MD       Initial Encounter Date:  Flowsheet Row Cardiac Rehab from 04/09/2022 in [facility] Cardiac and Pulmonary Rehab  Date 04/09/22       Visit Diagnosis: NSTEMI (non-ST elevation myocardial infarction) [facility])  Status post coronary artery stent placement  Patient's Home Medications on Admission:  Current Outpatient Medications:    aspirin EC 81 MG tablet, Take 1 tablet (81 mg total) by mouth daily. Swallow whole., Disp: 30 tablet, Rfl: 12   atorvastatin (LIPITOR) 80 MG tablet, Take 1 tablet (80 mg total) by mouth every evening., Disp: 90 tablet, Rfl: 1   B Complex Vitamins (VITAMIN-B COMPLEX PO), Take by mouth. (Patient not taking: Reported on 03/30/2022), Disp: , Rfl:    Cholecalciferol (VITAMIN D-3 PO), Take by mouth. (Patient not taking: Reported on 03/30/2022), Disp: , Rfl:    CINNAMON PO, Take by mouth. (Patient not taking: Reported on 03/30/2022), Disp: , Rfl:    Coenzyme Q10 (CO Q 10 PO), Take by mouth. (Patient not taking: Reported on 03/30/2022), Disp: , Rfl:    Cyanocobalamin (VITAMIN B12 PO), Take by mouth daily. (Patient not taking: Reported on 03/30/2022), Disp: , Rfl:    GARLIC PO, Take by mouth. (Patient not taking: Reported on 03/30/2022), Disp: , Rfl:    Lactobacillus (ACIDOPHILUS PO), Take by mouth. (Patient not taking: Reported on 03/30/2022), Disp: , Rfl:    Milk Thistle 250 MG CAPS, Take by mouth. (Patient not taking: Reported on 03/30/2022), Disp: , Rfl:    Multiple Vitamins-Minerals (MEGA BASIC PO), Take by mouth. (Patient not taking: Reported on 03/30/2022), Disp: , Rfl:    Omega-3 Fatty Acids (OMEGA 3 PO), Take 3,000 mg by mouth 2 (two) times daily. (Patient not taking: Reported on 03/30/2022), Disp: , Rfl:    Selenium 200  MCG CAPS, Take by mouth daily. (Patient not taking: Reported on 03/30/2022), Disp: , Rfl:    ticagrelor (BRILINTA) 90 MG TABS tablet, Take 1 tablet (90 mg total) by mouth 2 (two) times daily. (Patient not taking: Reported on 03/30/2022), Disp: 180 each, Rfl: 3   vitamin E 400 UNIT capsule, Take 400 Units by mouth daily. (Patient not taking: Reported on 03/30/2022), Disp: , Rfl:   Past Medical History: Past Medical History:  Diagnosis Date   Colon polyp    GERD (gastroesophageal reflux disease)    Hyperlipidemia    Kidney stone     Tobacco Use: Social History   Tobacco Use  Smoking Status Never  Smokeless Tobacco Never    Labs: Review Flowsheet  More data exists      Latest Ref Rng & Units 10/26/2016 05/14/2017 09/13/2018 10/23/2019 03/04/2022  Labs for ITP Cardiac and Pulmonary Rehab  Cholestrol 0 - 200 mg/dL 165  186  179  165  166   LDL (calc) 0 - 99 mg/dL 87  110  87  95  107   HDL-C >40 mg/dL 35  46  46  42  34   Trlycerides <150 mg/dL 215  182  229  163  123      Exercise Target Goals: Exercise Program Goal: Individual exercise prescription set using results from initial 6 min walk test and THRR while considering  patient's activity barriers and safety.   Exercise Prescription Goal: Initial exercise  prescription builds to 30-45 minutes a day of aerobic activity, 2-3 days per week.  Home exercise guidelines will be given to patient during program as part of exercise prescription that the participant will acknowledge.   Education: Aerobic Exercise: - Group verbal and visual presentation on the components of exercise prescription. Introduces F.I.T.T principle from ACSM for exercise prescriptions.  Reviews F.I.T.T. principles of aerobic exercise including progression. Written material given at graduation. Flowsheet Row Cardiac Rehab from 04/09/2022 in [facility] Cardiac and Pulmonary Rehab  Education need identified 04/09/22       Education: Resistance Exercise: - Group verbal and  visual presentation on the components of exercise prescription. Introduces F.I.T.T principle from ACSM for exercise prescriptions  Reviews F.I.T.T. principles of resistance exercise including progression. Written material given at graduation.    Education: Exercise & Equipment Safety: - Individual verbal instruction and demonstration of equipment use and safety with use of the equipment. Flowsheet Row Cardiac Rehab from 04/09/2022 in [facility] Cardiac and Pulmonary Rehab  Education need identified 04/09/22  Date 04/09/22  Educator [place]  Instruction Review Code 1- Verbalizes Understanding       Education: Exercise Physiology & General Exercise Guidelines: - Group verbal and written instruction with models to review the exercise physiology of the cardiovascular system and associated critical values. Provides general exercise guidelines with specific guidelines to those with heart or lung disease.    Education: Flexibility, Balance, Mind/Body Relaxation: - Group verbal and visual presentation with interactive activity on the components of exercise prescription. Introduces F.I.T.T principle from ACSM for exercise prescriptions. Reviews F.I.T.T. principles of flexibility and balance exercise training including progression. Also discusses the mind body connection.  Reviews various relaxation techniques to help reduce and manage stress (i.e. Deep breathing, progressive muscle relaxation, and visualization). Balance handout provided to take home. Written material given at graduation.   Activity Barriers & Risk Stratification:  Activity Barriers & Cardiac Risk Stratification - 04/09/22 1604       Activity Barriers & Cardiac Risk Stratification   Activity Barriers None    Cardiac Risk Stratification Moderate             6 Minute Walk:  6 Minute Walk     Row Name 04/09/22 1604         6 Minute Walk   Phase Initial     Distance 1370 feet     Walk Time 6 minutes     MPH 0     METS 3.35      RPE 8     Perceived Dyspnea  0     VO2 Peak 11.74     Symptoms No     Resting HR 68 bpm     Resting BP 136/78     Resting Oxygen Saturation  95 %     Exercise Oxygen Saturation  during 6 min walk 96 %     Max Ex. HR 105 bpm     Max Ex. BP 140/74     2 Minute Post BP 112/72              Oxygen Initial Assessment:   Oxygen Re-Evaluation:   Oxygen Discharge (Final Oxygen Re-Evaluation):   Initial Exercise Prescription:  Initial Exercise Prescription - 04/09/22 1600       Date of Initial Exercise RX and Referring Provider   Date 04/09/22    Referring Provider Neoma Laming MD      Treadmill   MPH 2.6    Grade 1  Minutes 15    METs 3.35      NuStep   Level 3    SPM 80    Minutes 15    METs 3.3      REL-XR   Level 2    Speed 50    Minutes 15    METs 3.3      Prescription Details   Frequency (times per week) 2    Duration Progress to 30 minutes of continuous aerobic without signs/symptoms of physical distress      Intensity   THRR 40-80% of Max Heartrate 103 - 138    Ratings of Perceived Exertion 11-13    Perceived Dyspnea 0-4      Progression   Progression Continue to progress workloads to maintain intensity without signs/symptoms of physical distress.      Resistance Training   Training Prescription Yes    Weight 5 lb    Reps 10-15             Perform Capillary Blood Glucose checks as needed.  Exercise Prescription Changes:   Exercise Prescription Changes     Row Name 04/09/22 1600             Response to Exercise   Blood Pressure (Admit) 136/78       Blood Pressure (Exercise) 140/74       Blood Pressure (Exit) 112/72       Heart Rate (Admit) 68 bpm       Heart Rate (Exercise) 105 bpm       Heart Rate (Exit) 71 bpm       Oxygen Saturation (Admit) 95 %       Oxygen Saturation (Exercise) 96 %       Oxygen Saturation (Exit) 96 %       Rating of Perceived Exertion (Exercise) 8       Perceived Dyspnea (Exercise) 0        Symptoms none       Comments walk test results                Exercise Comments:   Exercise Goals and Review:   Exercise Goals     Row Name 04/09/22 1618             Exercise Goals   Increase Physical Activity Yes       Intervention Provide advice, education, support and counseling about physical activity/exercise needs.;Develop an individualized exercise prescription for aerobic and resistive training based on initial evaluation findings, risk stratification, comorbidities and participant's personal goals.       Expected Outcomes Long Term: Add in home exercise to make exercise part of routine and to increase amount of physical activity.;Long Term: Exercising regularly at least 3-5 days a week.;Short Term: Attend rehab on a regular basis to increase amount of physical activity.       Increase Strength and Stamina Yes       Intervention Provide advice, education, support and counseling about physical activity/exercise needs.;Develop an individualized exercise prescription for aerobic and resistive training based on initial evaluation findings, risk stratification, comorbidities and participant's personal goals.       Expected Outcomes Short Term: Increase workloads from initial exercise prescription for resistance, speed, and METs.;Short Term: Perform resistance training exercises routinely during rehab and add in resistance training at home;Long Term: Improve cardiorespiratory fitness, muscular endurance and strength as measured by increased METs and functional capacity (6MWT)       Able to understand and use  rate of perceived exertion (RPE) scale Yes       Intervention Provide education and explanation on how to use RPE scale       Expected Outcomes Short Term: Able to use RPE daily in rehab to express subjective intensity level;Long Term:  Able to use RPE to guide intensity level when exercising independently       Able to understand and use Dyspnea scale Yes       Intervention  Provide education and explanation on how to use Dyspnea scale       Expected Outcomes Short Term: Able to use Dyspnea scale daily in rehab to express subjective sense of shortness of breath during exertion;Long Term: Able to use Dyspnea scale to guide intensity level when exercising independently       Knowledge and understanding of Target Heart Rate Range (THRR) Yes       Intervention Provide education and explanation of THRR including how the numbers were predicted and where they are located for reference       Expected Outcomes Short Term: Able to state/look up THRR;Long Term: Able to use THRR to govern intensity when exercising independently;Short Term: Able to use daily as guideline for intensity in rehab       Able to check pulse independently Yes       Intervention Provide education and demonstration on how to check pulse in carotid and radial arteries.;Review the importance of being able to check your own pulse for safety during independent exercise       Expected Outcomes Short Term: Able to explain why pulse checking is important during independent exercise;Long Term: Able to check pulse independently and accurately       Understanding of Exercise Prescription Yes       Intervention Provide education, explanation, and written materials on patient's individual exercise prescription       Expected Outcomes Short Term: Able to explain program exercise prescription;Long Term: Able to explain home exercise prescription to exercise independently                Exercise Goals Re-Evaluation :   Discharge Exercise Prescription (Final Exercise Prescription Changes):  Exercise Prescription Changes - 04/09/22 1600       Response to Exercise   Blood Pressure (Admit) 136/78    Blood Pressure (Exercise) 140/74    Blood Pressure (Exit) 112/72    Heart Rate (Admit) 68 bpm    Heart Rate (Exercise) 105 bpm    Heart Rate (Exit) 71 bpm    Oxygen Saturation (Admit) 95 %    Oxygen Saturation  (Exercise) 96 %    Oxygen Saturation (Exit) 96 %    Rating of Perceived Exertion (Exercise) 8    Perceived Dyspnea (Exercise) 0    Symptoms none    Comments walk test results             Nutrition:  Target Goals: Understanding of nutrition guidelines, daily intake of sodium '1500mg'$ , cholesterol '200mg'$ , calories 30% from fat and 7% or less from saturated fats, daily to have 5 or more servings of fruits and vegetables.  Education: All About Nutrition: -Group instruction provided by verbal, written material, interactive activities, discussions, models, and posters to present general guidelines for heart healthy nutrition including fat, fiber, MyPlate, the role of sodium in heart healthy nutrition, utilization of the nutrition label, and utilization of this knowledge for meal planning. Follow up email sent as well. Written material given at graduation.   Biometrics:  Pre  Biometrics - 04/09/22 1604       Pre Biometrics   Height '5\' 9"'$  (1.753 m)    Weight 202 lb (91.6 kg)    BMI (Calculated) 29.82    Single Leg Stand 30 seconds              Nutrition Therapy Plan and Nutrition Goals:  Nutrition Therapy & Goals - 04/09/22 1602       Intervention Plan   Intervention Prescribe, educate and counsel regarding individualized specific dietary modifications aiming towards targeted core components such as weight, hypertension, lipid management, diabetes, heart failure and other comorbidities.    Expected Outcomes Short Term Goal: Understand basic principles of dietary content, such as calories, fat, sodium, cholesterol and nutrients.;Short Term Goal: A plan has been developed with personal nutrition goals set during dietitian appointment.;Long Term Goal: Adherence to prescribed nutrition plan.             Nutrition Assessments:  MEDIFICTS Score Key: ?70 Need to make dietary changes  40-70 Heart Healthy Diet ? 40 Therapeutic Level Cholesterol Diet  Flowsheet Row Cardiac Rehab  from 04/09/2022 in [facility] Cardiac and Pulmonary Rehab  Picture Your Plate Total Score on Admission 62      Picture Your Plate Scores: <44 Unhealthy dietary pattern with much room for improvement. 41-50 Dietary pattern unlikely to meet recommendations for good health and room for improvement. 51-60 More healthful dietary pattern, with some room for improvement.  >60 Healthy dietary pattern, although there may be some specific behaviors that could be improved.    Nutrition Goals Re-Evaluation:   Nutrition Goals Discharge (Final Nutrition Goals Re-Evaluation):   Psychosocial: Target Goals: Acknowledge presence or absence of significant depression and/or stress, maximize coping skills, provide positive support system. Participant is able to verbalize types and ability to use techniques and skills needed for reducing stress and depression.   Education: Stress, Anxiety, and Depression - Group verbal and visual presentation to define topics covered.  Reviews how body is impacted by stress, anxiety, and depression.  Also discusses healthy ways to reduce stress and to treat/manage anxiety and depression.  Written material given at graduation.   Education: Sleep Hygiene -Provides group verbal and written instruction about how sleep can affect your health.  Define sleep hygiene, discuss sleep cycles and impact of sleep habits. Review good sleep hygiene tips.    Initial Review & Psychosocial Screening:  Initial Psych Review & Screening - 03/30/22 1412       Initial Review   Current issues with None Identified      Family Dynamics   Good Support System? Yes   betty,wife   Concerns Recent loss of child    Comments daughter dies with sepsis in hospital       son died in a hospital.  Sonia Side tries to  not think about it.      Barriers   Psychosocial barriers to participate in program There are no identifiable barriers or psychosocial needs.      Screening Interventions   Interventions  Encouraged to exercise;To provide support and resources with identified psychosocial needs;Provide feedback about the scores to participant    Expected Outcomes Short Term goal: Utilizing psychosocial counselor, staff and physician to assist with identification of specific Stressors or current issues interfering with healing process. Setting desired goal for each stressor or current issue identified.;Long Term Goal: Stressors or current issues are controlled or eliminated.;Short Term goal: Identification and review with participant of any Quality of Life or Depression  concerns found by scoring the questionnaire.;Long Term goal: The participant improves quality of Life and PHQ9 Scores as seen by post scores and/or verbalization of changes             Quality of Life Scores:   Quality of Life - 04/09/22 1601       Quality of Life   Select Quality of Life      Quality of Life Scores   Health/Function Pre 20.2 %    Socioeconomic Pre 23.29 %    Psych/Spiritual Pre 25.5 %    Family Pre 26 %    GLOBAL Pre 30 %            Scores of 19 and below usually indicate a poorer quality of life in these areas.  A difference of  2-3 points is a clinically meaningful difference.  A difference of 2-3 points in the total score of the Quality of Life Index has been associated with significant improvement in overall quality of life, self-image, physical symptoms, and general health in studies assessing change in quality of life.  PHQ-9: Review Flowsheet       04/09/2022 05/14/2017  Depression screen PHQ 2/9  Decreased Interest 1 0  Down, Depressed, Hopeless 1 0  PHQ - 2 Score 2 0  Altered sleeping 1 -  Tired, decreased energy 0 -  Change in appetite 0 -  Feeling bad or failure about yourself  1 -  Trouble concentrating 0 -  Moving slowly or fidgety/restless 0 -  Suicidal thoughts 0 -  PHQ-9 Score 4 -  Difficult doing work/chores Not difficult at all -   Interpretation of Total Score  Total  Score Depression Severity:  1-4 = Minimal depression, 5-9 = Mild depression, 10-14 = Moderate depression, 15-19 = Moderately severe depression, 20-27 = Severe depression   Psychosocial Evaluation and Intervention:  Psychosocial Evaluation - 03/30/22 1429       Psychosocial Evaluation & Interventions   Interventions Encouraged to exercise with the program and follow exercise prescription    Comments Sonia Side has no barriers to attending the program. He is ready to get started and learn all he can to manage his heart disease. He has 2 children that passed away while in the hospital. This was not recent. He stated he does not think about this all the time. He is worried about his diagnosis and what he can manage. We did review that it is very different for patients now than 50 yeaqrs ago with a heart attack. He stated his doctor told him the same. He should do well with the program.    Expected Outcomes STG Sonia Side attends all exercise and education sessions. he progresses with his exercise routine. LTG Sonia Side is able to continue his exercise progression and manage his heart disease after discharge.    Continue Psychosocial Services  Follow up required by staff             Psychosocial Re-Evaluation:   Psychosocial Discharge (Final Psychosocial Re-Evaluation):   Vocational Rehabilitation: Provide vocational rehab assistance to qualifying candidates.   Vocational Rehab Evaluation & Intervention:   Education: Education Goals: Education classes will be provided on a variety of topics geared toward better understanding of heart health and risk factor modification. Participant will state understanding/return demonstration of topics presented as noted by education test scores.  Learning Barriers/Preferences:   General Cardiac Education Topics:  AED/CPR: - Group verbal and written instruction with the use of models to demonstrate the basic  use of the AED with the basic ABC's of  resuscitation.   Anatomy and Cardiac Procedures: - Group verbal and visual presentation and models provide information about basic cardiac anatomy and function. Reviews the testing methods done to diagnose heart disease and the outcomes of the test results. Describes the treatment choices: Medical Management, Angioplasty, or Coronary Bypass Surgery for treating various heart conditions including Myocardial Infarction, Angina, Valve Disease, and Cardiac Arrhythmias.  Written material given at graduation.   Medication Safety: - Group verbal and visual instruction to review commonly prescribed medications for heart and lung disease. Reviews the medication, class of the drug, and side effects. Includes the steps to properly store meds and maintain the prescription regimen.  Written material given at graduation.   Intimacy: - Group verbal instruction through game format to discuss how heart and lung disease can affect sexual intimacy. Written material given at graduation..   Know Your Numbers and Heart Failure: - Group verbal and visual instruction to discuss disease risk factors for cardiac and pulmonary disease and treatment options.  Reviews associated critical values for Overweight/Obesity, Hypertension, Cholesterol, and Diabetes.  Discusses basics of heart failure: signs/symptoms and treatments.  Introduces Heart Failure Zone chart for action plan for heart failure.  Written material given at graduation. Flowsheet Row Cardiac Rehab from 04/09/2022 in [facility] Cardiac and Pulmonary Rehab  Education need identified 04/09/22       Infection Prevention: - Provides verbal and written material to individual with discussion of infection control including proper hand washing and proper equipment cleaning during exercise session. Flowsheet Row Cardiac Rehab from 04/09/2022 in [facility] Cardiac and Pulmonary Rehab  Education need identified 04/09/22  Date 04/09/22  Educator [place]  Instruction Review Code 1-  Verbalizes Understanding       Falls Prevention: - Provides verbal and written material to individual with discussion of falls prevention and safety. Flowsheet Row Cardiac Rehab from 03/30/2022 in [facility] Cardiac and Pulmonary Rehab  Date 03/30/22  Educator [place]  Instruction Review Code 1- Verbalizes Understanding       Other: -Provides group and verbal instruction on various topics (see comments)   Knowledge Questionnaire Score:  Knowledge Questionnaire Score - 04/09/22 1558       Knowledge Questionnaire Score   Pre Score 24/26             Core Components/Risk Factors/Patient Goals at Admission:  Personal Goals and Risk Factors at Admission - 04/09/22 1619       Core Components/Risk Factors/Patient Goals on Admission    Weight Management Yes;Weight Loss    Intervention Weight Management: Develop a combined nutrition and exercise program designed to reach desired caloric intake, while maintaining appropriate intake of nutrient and fiber, sodium and fats, and appropriate energy expenditure required for the weight goal.;Weight Management: Provide education and appropriate resources to help participant work on and attain dietary goals.;Weight Management/Obesity: Establish reasonable short term and long term weight goals.    Admit Weight 202 lb (91.6 kg)    Goal Weight: Short Term 197 lb (89.4 kg)    Goal Weight: Long Term 180 lb (81.6 kg)   per patient   Expected Outcomes Short Term: Continue to assess and modify interventions until short term weight is achieved;Long Term: Adherence to nutrition and physical activity/exercise program aimed toward attainment of established weight goal;Weight Loss: Understanding of general recommendations for a balanced deficit meal plan, which promotes 1-2 lb weight loss per week and includes a negative energy balance of [phone] kcal/d;Understanding recommendations for  meals to include 15-35% energy as protein, 25-35% energy from fat, 35-60% energy  from carbohydrates, less than '200mg'$  of dietary cholesterol, 20-35 gm of total fiber daily;Understanding of distribution of calorie intake throughout the day with the consumption of 4-5 meals/snacks    Lipids Yes    Intervention Provide education and support for participant on nutrition & aerobic/resistive exercise along with prescribed medications to achieve LDL '70mg'$ , HDL >'40mg'$ .    Expected Outcomes Short Term: Participant states understanding of desired cholesterol values and is compliant with medications prescribed. Participant is following exercise prescription and nutrition guidelines.;Long Term: Cholesterol controlled with medications as prescribed, with individualized exercise RX and with personalized nutrition plan. Value goals: LDL < '70mg'$ , HDL > 40 mg.             Education:Diabetes - Individual verbal and written instruction to review signs/symptoms of diabetes, desired ranges of glucose level fasting, after meals and with exercise. Acknowledge that pre and post exercise glucose checks will be done for 3 sessions at entry of program.   Core Components/Risk Factors/Patient Goals Review:    Core Components/Risk Factors/Patient Goals at Discharge (Final Review):    ITP Comments:  ITP Comments     Row Name 03/30/22 1438 04/09/22 1557         ITP Comments Virtual orientation call completed today. he has an appointment on Date: 04/09/2022  for EP eval and gym Orientation.  Documentation of diagnosis can be found in [facility] Date: 03/02/2022 . Completed 6MWT and gym orientation. Initial ITP created and sent for review to Dr. Ramonita Lab.               Comments: Initial ITP

## 2022-04-15 ENCOUNTER — Other Ambulatory Visit: Payer: Self-pay

## 2022-04-15 ENCOUNTER — Emergency Department: Payer: Managed Care, Other (non HMO)

## 2022-04-15 ENCOUNTER — Emergency Department
Admission: EM | Admit: 2022-04-15 | Discharge: 2022-04-15 | Disposition: A | Discharge status: Home / Self Care | Payer: Managed Care, Other (non HMO) | Attending: Emergency Medicine | Admitting: Emergency Medicine

## 2022-04-15 ENCOUNTER — Encounter: Payer: Self-pay | Admitting: *Deleted

## 2022-04-15 DIAGNOSIS — R0602 Shortness of breath: Secondary | ICD-10-CM | POA: Diagnosis not present

## 2022-04-15 DIAGNOSIS — Z955 Presence of coronary angioplasty implant and graft: Secondary | ICD-10-CM

## 2022-04-15 DIAGNOSIS — R079 Chest pain, unspecified: Secondary | ICD-10-CM | POA: Diagnosis present

## 2022-04-15 DIAGNOSIS — I214 Non-ST elevation (NSTEMI) myocardial infarction: Secondary | ICD-10-CM | POA: Diagnosis not present

## 2022-04-15 DIAGNOSIS — Z20822 Contact with and (suspected) exposure to covid-19: Secondary | ICD-10-CM | POA: Insufficient documentation

## 2022-04-15 LAB — CBC WITH DIFFERENTIAL/PLATELET
Abs Immature Granulocytes: 0.01 10*3/uL (ref 0.00–0.07)
Basophils Absolute: 0 10*3/uL (ref 0.0–0.1)
Basophils Relative: 1 %
Eosinophils Absolute: 0.1 10*3/uL (ref 0.0–0.5)
Eosinophils Relative: 4 %
HCT: 42.1 % (ref 39.0–52.0)
HCT: 42.9 % (ref 39.0–52.0)
Hemoglobin: 14.1 g/dL (ref 13.0–17.0)
Hemoglobin: 14.2 g/dL (ref 13.0–17.0)
Immature Granulocytes: 0 %
Lymphocytes Relative: 43 %
Lymphs Abs: 1.2 10*3/uL (ref 0.7–4.0)
MCH: 31.3 pg (ref 26.0–34.0)
MCH: 31.4 pg (ref 26.0–34.0)
MCHC: 33.1 g/dL (ref 30.0–36.0)
MCHC: 33.5 g/dL (ref 30.0–36.0)
MCV: 93.6 fL (ref 80.0–100.0)
MCV: 94.9 fL (ref 80.0–100.0)
Monocytes Absolute: 0.3 10*3/uL (ref 0.1–1.0)
Monocytes Relative: 12 %
Neutro Abs: 1.2 10*3/uL — ABNORMAL LOW (ref 1.7–7.7)
Neutrophils Relative %: 40 %
Platelets: 196 10*3/uL (ref 150–400)
Platelets: 209 10*3/uL (ref 150–400)
RBC: 4.5 MIL/uL (ref 4.22–5.81)
RBC: 4.52 MIL/uL (ref 4.22–5.81)
RDW: 14.2 % (ref 11.5–15.5)
RDW: 14.3 % (ref 11.5–15.5)
WBC: 2.8 10*3/uL — ABNORMAL LOW (ref 4.0–10.5)
WBC: 2.9 10*3/uL — ABNORMAL LOW (ref 4.0–10.5)
nRBC: 0 % (ref 0.0–0.2)
nRBC: 0 % (ref 0.0–0.2)

## 2022-04-15 LAB — TROPONIN I (HIGH SENSITIVITY)
Troponin I (High Sensitivity): 5 ng/L (ref <18)
Troponin I (High Sensitivity): 6 ng/L (ref <18)

## 2022-04-15 LAB — CBC
HCT: 41.5 % (ref 39.0–52.0)
Hemoglobin: 14.1 g/dL (ref 13.0–17.0)
MCH: 31.7 pg (ref 26.0–34.0)
MCHC: 34 g/dL (ref 30.0–36.0)
MCV: 93.3 fL (ref 80.0–100.0)
Platelets: 198 10*3/uL (ref 150–400)
RBC: 4.45 MIL/uL (ref 4.22–5.81)
RDW: 14.2 % (ref 11.5–15.5)
WBC: 2.8 10*3/uL — ABNORMAL LOW (ref 4.0–10.5)
nRBC: 0 % (ref 0.0–0.2)

## 2022-04-15 LAB — COMPREHENSIVE METABOLIC PANEL
ALT: 28 U/L (ref 0–44)
AST: 18 U/L (ref 15–41)
Albumin: 3.6 g/dL (ref 3.5–5.0)
Alkaline Phosphatase: 43 U/L (ref 38–126)
Anion gap: 5 (ref 5–15)
BUN: 11 mg/dL (ref 8–23)
CO2: 21 mmol/L — ABNORMAL LOW (ref 22–32)
Calcium: 8.1 mg/dL — ABNORMAL LOW (ref 8.9–10.3)
Chloride: 115 mmol/L — ABNORMAL HIGH (ref 98–111)
Creatinine, Ser: 0.73 mg/dL (ref 0.61–1.24)
GFR, Estimated: >60 mL/min (ref >60)
Glucose, Bld: 107 mg/dL — ABNORMAL HIGH (ref 70–99)
Potassium: 3.4 mmol/L — ABNORMAL LOW (ref 3.5–5.1)
Sodium: 141 mmol/L (ref 135–145)
Total Bilirubin: 1 mg/dL (ref 0.3–1.2)
Total Protein: 6.7 g/dL (ref 6.5–8.1)

## 2022-04-15 LAB — D-DIMER, QUANTITATIVE: D-Dimer, Quant: <0.27 ug/mL-FEU (ref 0.00–0.50)

## 2022-04-15 LAB — SARS CORONAVIRUS 2 BY RT PCR: SARS Coronavirus 2 by RT PCR: NEGATIVE

## 2022-04-15 LAB — LIPASE, BLOOD: Lipase: 39 U/L (ref 11–51)

## 2022-04-15 MED ORDER — POTASSIUM CHLORIDE CRYS ER 20 MEQ PO TBCR
20.0000 meq | EXTENDED_RELEASE_TABLET | Freq: Once | ORAL | Status: AC
  Administered 2022-04-15: 20 meq via ORAL
  Filled 2022-04-15: qty 1

## 2022-04-15 NOTE — Progress Notes (Signed)
Cardiac Individual Treatment Plan  Patient Details  Name: Richard Warren MRN: [id number] Date of Birth: November 20, 1955 Referring Provider:   Flowsheet Row Cardiac Rehab from 04/09/2022 in [facility] Cardiac and Pulmonary Rehab  Referring Provider Neoma Laming MD       Initial Encounter Date:  Flowsheet Row Cardiac Rehab from 04/09/2022 in [facility] Cardiac and Pulmonary Rehab  Date 04/09/22       Visit Diagnosis: NSTEMI (non-ST elevation myocardial infarction) [facility])  Status post coronary artery stent placement  Patient's Home Medications on Admission:  Current Outpatient Medications:    aspirin EC 81 MG tablet, Take 1 tablet (81 mg total) by mouth daily. Swallow whole., Disp: 30 tablet, Rfl: 12   atorvastatin (LIPITOR) 80 MG tablet, Take 1 tablet (80 mg total) by mouth every evening., Disp: 90 tablet, Rfl: 1   B Complex Vitamins (VITAMIN-B COMPLEX PO), Take by mouth. (Patient not taking: Reported on 03/30/2022), Disp: , Rfl:    Cholecalciferol (VITAMIN D-3 PO), Take by mouth. (Patient not taking: Reported on 03/30/2022), Disp: , Rfl:    CINNAMON PO, Take by mouth. (Patient not taking: Reported on 03/30/2022), Disp: , Rfl:    Coenzyme Q10 (CO Q 10 PO), Take by mouth. (Patient not taking: Reported on 03/30/2022), Disp: , Rfl:    Cyanocobalamin (VITAMIN B12 PO), Take by mouth daily. (Patient not taking: Reported on 03/30/2022), Disp: , Rfl:    GARLIC PO, Take by mouth. (Patient not taking: Reported on 03/30/2022), Disp: , Rfl:    Lactobacillus (ACIDOPHILUS PO), Take by mouth. (Patient not taking: Reported on 03/30/2022), Disp: , Rfl:    Milk Thistle 250 MG CAPS, Take by mouth. (Patient not taking: Reported on 03/30/2022), Disp: , Rfl:    Multiple Vitamins-Minerals (MEGA BASIC PO), Take by mouth. (Patient not taking: Reported on 03/30/2022), Disp: , Rfl:    Omega-3 Fatty Acids (OMEGA 3 PO), Take 3,000 mg by mouth 2 (two) times daily. (Patient not taking: Reported on 03/30/2022), Disp: , Rfl:    Selenium 200  MCG CAPS, Take by mouth daily. (Patient not taking: Reported on 03/30/2022), Disp: , Rfl:    ticagrelor (BRILINTA) 90 MG TABS tablet, Take 1 tablet (90 mg total) by mouth 2 (two) times daily. (Patient not taking: Reported on 03/30/2022), Disp: 180 each, Rfl: 3   vitamin E 400 UNIT capsule, Take 400 Units by mouth daily. (Patient not taking: Reported on 03/30/2022), Disp: , Rfl:   Past Medical History: Past Medical History:  Diagnosis Date   Colon polyp    GERD (gastroesophageal reflux disease)    Hyperlipidemia    Kidney stone     Tobacco Use: Social History   Tobacco Use  Smoking Status Never  Smokeless Tobacco Never    Labs: Review Flowsheet  More data exists      Latest Ref Rng & Units 10/26/2016 05/14/2017 09/13/2018 10/23/2019 03/04/2022  Labs for ITP Cardiac and Pulmonary Rehab  Cholestrol 0 - 200 mg/dL 165  186  179  165  166   LDL (calc) 0 - 99 mg/dL 87  110  87  95  107   HDL-C >40 mg/dL 35  46  46  42  34   Trlycerides <150 mg/dL 215  182  229  163  123      Exercise Target Goals: Exercise Program Goal: Individual exercise prescription set using results from initial 6 min walk test and THRR while considering  patient's activity barriers and safety.   Exercise Prescription Goal: Initial exercise  prescription builds to 30-45 minutes a day of aerobic activity, 2-3 days per week.  Home exercise guidelines will be given to patient during program as part of exercise prescription that the participant will acknowledge.   Education: Aerobic Exercise: - Group verbal and visual presentation on the components of exercise prescription. Introduces F.I.T.T principle from ACSM for exercise prescriptions.  Reviews F.I.T.T. principles of aerobic exercise including progression. Written material given at graduation. Flowsheet Row Cardiac Rehab from 04/09/2022 in [facility] Cardiac and Pulmonary Rehab  Education need identified 04/09/22       Education: Resistance Exercise: - Group verbal and  visual presentation on the components of exercise prescription. Introduces F.I.T.T principle from ACSM for exercise prescriptions  Reviews F.I.T.T. principles of resistance exercise including progression. Written material given at graduation.    Education: Exercise & Equipment Safety: - Individual verbal instruction and demonstration of equipment use and safety with use of the equipment. Flowsheet Row Cardiac Rehab from 04/09/2022 in [facility] Cardiac and Pulmonary Rehab  Education need identified 04/09/22  Date 04/09/22  Educator [place]  Instruction Review Code 1- Verbalizes Understanding       Education: Exercise Physiology & General Exercise Guidelines: - Group verbal and written instruction with models to review the exercise physiology of the cardiovascular system and associated critical values. Provides general exercise guidelines with specific guidelines to those with heart or lung disease.    Education: Flexibility, Balance, Mind/Body Relaxation: - Group verbal and visual presentation with interactive activity on the components of exercise prescription. Introduces F.I.T.T principle from ACSM for exercise prescriptions. Reviews F.I.T.T. principles of flexibility and balance exercise training including progression. Also discusses the mind body connection.  Reviews various relaxation techniques to help reduce and manage stress (i.e. Deep breathing, progressive muscle relaxation, and visualization). Balance handout provided to take home. Written material given at graduation.   Activity Barriers & Risk Stratification:  Activity Barriers & Cardiac Risk Stratification - 04/09/22 1604       Activity Barriers & Cardiac Risk Stratification   Activity Barriers None    Cardiac Risk Stratification Moderate             6 Minute Walk:  6 Minute Walk     Row Name 04/09/22 1604         6 Minute Walk   Phase Initial     Distance 1370 feet     Walk Time 6 minutes     MPH 0     METS 3.35      RPE 8     Perceived Dyspnea  0     VO2 Peak 11.74     Symptoms No     Resting HR 68 bpm     Resting BP 136/78     Resting Oxygen Saturation  95 %     Exercise Oxygen Saturation  during 6 min walk 96 %     Max Ex. HR 105 bpm     Max Ex. BP 140/74     2 Minute Post BP 112/72              Oxygen Initial Assessment:   Oxygen Re-Evaluation:   Oxygen Discharge (Final Oxygen Re-Evaluation):   Initial Exercise Prescription:  Initial Exercise Prescription - 04/09/22 1600       Date of Initial Exercise RX and Referring Provider   Date 04/09/22    Referring Provider Neoma Laming MD      Treadmill   MPH 2.6    Grade 1  Minutes 15    METs 3.35      NuStep   Level 3    SPM 80    Minutes 15    METs 3.3      REL-XR   Level 2    Speed 50    Minutes 15    METs 3.3      Prescription Details   Frequency (times per week) 2    Duration Progress to 30 minutes of continuous aerobic without signs/symptoms of physical distress      Intensity   THRR 40-80% of Max Heartrate 103 - 138    Ratings of Perceived Exertion 11-13    Perceived Dyspnea 0-4      Progression   Progression Continue to progress workloads to maintain intensity without signs/symptoms of physical distress.      Resistance Training   Training Prescription Yes    Weight 5 lb    Reps 10-15             Perform Capillary Blood Glucose checks as needed.  Exercise Prescription Changes:   Exercise Prescription Changes     Row Name 04/09/22 1600             Response to Exercise   Blood Pressure (Admit) 136/78       Blood Pressure (Exercise) 140/74       Blood Pressure (Exit) 112/72       Heart Rate (Admit) 68 bpm       Heart Rate (Exercise) 105 bpm       Heart Rate (Exit) 71 bpm       Oxygen Saturation (Admit) 95 %       Oxygen Saturation (Exercise) 96 %       Oxygen Saturation (Exit) 96 %       Rating of Perceived Exertion (Exercise) 8       Perceived Dyspnea (Exercise) 0        Symptoms none       Comments walk test results                Exercise Comments:   Exercise Goals and Review:   Exercise Goals     Row Name 04/09/22 1618             Exercise Goals   Increase Physical Activity Yes       Intervention Provide advice, education, support and counseling about physical activity/exercise needs.;Develop an individualized exercise prescription for aerobic and resistive training based on initial evaluation findings, risk stratification, comorbidities and participant's personal goals.       Expected Outcomes Long Term: Add in home exercise to make exercise part of routine and to increase amount of physical activity.;Long Term: Exercising regularly at least 3-5 days a week.;Short Term: Attend rehab on a regular basis to increase amount of physical activity.       Increase Strength and Stamina Yes       Intervention Provide advice, education, support and counseling about physical activity/exercise needs.;Develop an individualized exercise prescription for aerobic and resistive training based on initial evaluation findings, risk stratification, comorbidities and participant's personal goals.       Expected Outcomes Short Term: Increase workloads from initial exercise prescription for resistance, speed, and METs.;Short Term: Perform resistance training exercises routinely during rehab and add in resistance training at home;Long Term: Improve cardiorespiratory fitness, muscular endurance and strength as measured by increased METs and functional capacity (6MWT)       Able to understand and use  rate of perceived exertion (RPE) scale Yes       Intervention Provide education and explanation on how to use RPE scale       Expected Outcomes Short Term: Able to use RPE daily in rehab to express subjective intensity level;Long Term:  Able to use RPE to guide intensity level when exercising independently       Able to understand and use Dyspnea scale Yes       Intervention  Provide education and explanation on how to use Dyspnea scale       Expected Outcomes Short Term: Able to use Dyspnea scale daily in rehab to express subjective sense of shortness of breath during exertion;Long Term: Able to use Dyspnea scale to guide intensity level when exercising independently       Knowledge and understanding of Target Heart Rate Range (THRR) Yes       Intervention Provide education and explanation of THRR including how the numbers were predicted and where they are located for reference       Expected Outcomes Short Term: Able to state/look up THRR;Long Term: Able to use THRR to govern intensity when exercising independently;Short Term: Able to use daily as guideline for intensity in rehab       Able to check pulse independently Yes       Intervention Provide education and demonstration on how to check pulse in carotid and radial arteries.;Review the importance of being able to check your own pulse for safety during independent exercise       Expected Outcomes Short Term: Able to explain why pulse checking is important during independent exercise;Long Term: Able to check pulse independently and accurately       Understanding of Exercise Prescription Yes       Intervention Provide education, explanation, and written materials on patient's individual exercise prescription       Expected Outcomes Short Term: Able to explain program exercise prescription;Long Term: Able to explain home exercise prescription to exercise independently                Exercise Goals Re-Evaluation :   Discharge Exercise Prescription (Final Exercise Prescription Changes):  Exercise Prescription Changes - 04/09/22 1600       Response to Exercise   Blood Pressure (Admit) 136/78    Blood Pressure (Exercise) 140/74    Blood Pressure (Exit) 112/72    Heart Rate (Admit) 68 bpm    Heart Rate (Exercise) 105 bpm    Heart Rate (Exit) 71 bpm    Oxygen Saturation (Admit) 95 %    Oxygen Saturation  (Exercise) 96 %    Oxygen Saturation (Exit) 96 %    Rating of Perceived Exertion (Exercise) 8    Perceived Dyspnea (Exercise) 0    Symptoms none    Comments walk test results             Nutrition:  Target Goals: Understanding of nutrition guidelines, daily intake of sodium '1500mg'$ , cholesterol '200mg'$ , calories 30% from fat and 7% or less from saturated fats, daily to have 5 or more servings of fruits and vegetables.  Education: All About Nutrition: -Group instruction provided by verbal, written material, interactive activities, discussions, models, and posters to present general guidelines for heart healthy nutrition including fat, fiber, MyPlate, the role of sodium in heart healthy nutrition, utilization of the nutrition label, and utilization of this knowledge for meal planning. Follow up email sent as well. Written material given at graduation.   Biometrics:  Pre  Biometrics - 04/09/22 1604       Pre Biometrics   Height '5\' 9"'$  (1.753 m)    Weight 202 lb (91.6 kg)    BMI (Calculated) 29.82    Single Leg Stand 30 seconds              Nutrition Therapy Plan and Nutrition Goals:  Nutrition Therapy & Goals - 04/09/22 1602       Intervention Plan   Intervention Prescribe, educate and counsel regarding individualized specific dietary modifications aiming towards targeted core components such as weight, hypertension, lipid management, diabetes, heart failure and other comorbidities.    Expected Outcomes Short Term Goal: Understand basic principles of dietary content, such as calories, fat, sodium, cholesterol and nutrients.;Short Term Goal: A plan has been developed with personal nutrition goals set during dietitian appointment.;Long Term Goal: Adherence to prescribed nutrition plan.             Nutrition Assessments:  MEDIFICTS Score Key: ?70 Need to make dietary changes  40-70 Heart Healthy Diet ? 40 Therapeutic Level Cholesterol Diet  Flowsheet Row Cardiac Rehab  from 04/09/2022 in [facility] Cardiac and Pulmonary Rehab  Picture Your Plate Total Score on Admission 62      Picture Your Plate Scores: <05 Unhealthy dietary pattern with much room for improvement. 41-50 Dietary pattern unlikely to meet recommendations for good health and room for improvement. 51-60 More healthful dietary pattern, with some room for improvement.  >60 Healthy dietary pattern, although there may be some specific behaviors that could be improved.    Nutrition Goals Re-Evaluation:   Nutrition Goals Discharge (Final Nutrition Goals Re-Evaluation):   Psychosocial: Target Goals: Acknowledge presence or absence of significant depression and/or stress, maximize coping skills, provide positive support system. Participant is able to verbalize types and ability to use techniques and skills needed for reducing stress and depression.   Education: Stress, Anxiety, and Depression - Group verbal and visual presentation to define topics covered.  Reviews how body is impacted by stress, anxiety, and depression.  Also discusses healthy ways to reduce stress and to treat/manage anxiety and depression.  Written material given at graduation.   Education: Sleep Hygiene -Provides group verbal and written instruction about how sleep can affect your health.  Define sleep hygiene, discuss sleep cycles and impact of sleep habits. Review good sleep hygiene tips.    Initial Review & Psychosocial Screening:  Initial Psych Review & Screening - 03/30/22 1412       Initial Review   Current issues with None Identified      Family Dynamics   Good Support System? Yes   betty,wife   Concerns Recent loss of child    Comments daughter dies with sepsis in hospital       son died in a hospital.  Sonia Side tries to  not think about it.      Barriers   Psychosocial barriers to participate in program There are no identifiable barriers or psychosocial needs.      Screening Interventions   Interventions  Encouraged to exercise;To provide support and resources with identified psychosocial needs;Provide feedback about the scores to participant    Expected Outcomes Short Term goal: Utilizing psychosocial counselor, staff and physician to assist with identification of specific Stressors or current issues interfering with healing process. Setting desired goal for each stressor or current issue identified.;Long Term Goal: Stressors or current issues are controlled or eliminated.;Short Term goal: Identification and review with participant of any Quality of Life or Depression  concerns found by scoring the questionnaire.;Long Term goal: The participant improves quality of Life and PHQ9 Scores as seen by post scores and/or verbalization of changes             Quality of Life Scores:   Quality of Life - 04/09/22 1601       Quality of Life   Select Quality of Life      Quality of Life Scores   Health/Function Pre 20.2 %    Socioeconomic Pre 23.29 %    Psych/Spiritual Pre 25.5 %    Family Pre 26 %    GLOBAL Pre 30 %            Scores of 19 and below usually indicate a poorer quality of life in these areas.  A difference of  2-3 points is a clinically meaningful difference.  A difference of 2-3 points in the total score of the Quality of Life Index has been associated with significant improvement in overall quality of life, self-image, physical symptoms, and general health in studies assessing change in quality of life.  PHQ-9: Review Flowsheet       04/09/2022 05/14/2017  Depression screen PHQ 2/9  Decreased Interest 1 0  Down, Depressed, Hopeless 1 0  PHQ - 2 Score 2 0  Altered sleeping 1 -  Tired, decreased energy 0 -  Change in appetite 0 -  Feeling bad or failure about yourself  1 -  Trouble concentrating 0 -  Moving slowly or fidgety/restless 0 -  Suicidal thoughts 0 -  PHQ-9 Score 4 -  Difficult doing work/chores Not difficult at all -   Interpretation of Total Score  Total  Score Depression Severity:  1-4 = Minimal depression, 5-9 = Mild depression, 10-14 = Moderate depression, 15-19 = Moderately severe depression, 20-27 = Severe depression   Psychosocial Evaluation and Intervention:  Psychosocial Evaluation - 03/30/22 1429       Psychosocial Evaluation & Interventions   Interventions Encouraged to exercise with the program and follow exercise prescription    Comments Sonia Side has no barriers to attending the program. He is ready to get started and learn all he can to manage his heart disease. He has 2 children that passed away while in the hospital. This was not recent. He stated he does not think about this all the time. He is worried about his diagnosis and what he can manage. We did review that it is very different for patients now than 50 yeaqrs ago with a heart attack. He stated his doctor told him the same. He should do well with the program.    Expected Outcomes STG Sonia Side attends all exercise and education sessions. he progresses with his exercise routine. LTG S[place] Side is able to continue his exercise progression and manage his heart disease after discharge.    Continue Psychosocial Services  Follow up required by staff             Psychosocial Re-Evaluation:   Psychosocial Discharge (Final Psychosocial Re-Evaluation):   Vocational Rehabilitation: Provide vocational rehab assistance to qualifying candidates.   Vocational Rehab Evaluation & Intervention:   Education: Education Goals: Education classes will be provided on a variety of topics geared toward better understanding of heart health and risk factor modification. Participant will state understanding/return demonstration of topics presented as noted by education test scores.  Learning Barriers/Preferences:   General Cardiac Education Topics:  AED/CPR: - Group verbal and written instruction with the use of models to demonstrate the basic  use of the AED with the basic ABC's of  resuscitation.   Anatomy and Cardiac Procedures: - Group verbal and visual presentation and models provide information about basic cardiac anatomy and function. Reviews the testing methods done to diagnose heart disease and the outcomes of the test results. Describes the treatment choices: Medical Management, Angioplasty, or Coronary Bypass Surgery for treating various heart conditions including Myocardial Infarction, Angina, Valve Disease, and Cardiac Arrhythmias.  Written material given at graduation.   Medication Safety: - Group verbal and visual instruction to review commonly prescribed medications for heart and lung disease. Reviews the medication, class of the drug, and side effects. Includes the steps to properly store meds and maintain the prescription regimen.  Written material given at graduation.   Intimacy: - Group verbal instruction through game format to discuss how heart and lung disease can affect sexual intimacy. Written material given at graduation..   Know Your Numbers and Heart Failure: - Group verbal and visual instruction to discuss disease risk factors for cardiac and pulmonary disease and treatment options.  Reviews associated critical values for Overweight/Obesity, Hypertension, Cholesterol, and Diabetes.  Discusses basics of heart failure: signs/symptoms and treatments.  Introduces Heart Failure Zone chart for action plan for heart failure.  Written material given at graduation. Flowsheet Row Cardiac Rehab from 04/09/2022 in [facility] Cardiac and Pulmonary Rehab  Education need identified 04/09/22       Infection Prevention: - Provides verbal and written material to individual with discussion of infection control including proper hand washing and proper equipment cleaning during exercise session. Flowsheet Row Cardiac Rehab from 04/09/2022 in [facility] Cardiac and Pulmonary Rehab  Education need identified 04/09/22  Date 04/09/22  Educator [place]  Instruction Review Code 1-  Verbalizes Understanding       Falls Prevention: - Provides verbal and written material to individual with discussion of falls prevention and safety. Flowsheet Row Cardiac Rehab from 03/30/2022 in [facility] Cardiac and Pulmonary Rehab  Date 03/30/22  Educator [place]  Instruction Review Code 1- Verbalizes Understanding       Other: -Provides group and verbal instruction on various topics (see comments)   Knowledge Questionnaire Score:  Knowledge Questionnaire Score - 04/09/22 1558       Knowledge Questionnaire Score   Pre Score 24/26             Core Components/Risk Factors/Patient Goals at Admission:  Personal Goals and Risk Factors at Admission - 04/09/22 1619       Core Components/Risk Factors/Patient Goals on Admission    Weight Management Yes;Weight Loss    Intervention Weight Management: Develop a combined nutrition and exercise program designed to reach desired caloric intake, while maintaining appropriate intake of nutrient and fiber, sodium and fats, and appropriate energy expenditure required for the weight goal.;Weight Management: Provide education and appropriate resources to help participant work on and attain dietary goals.;Weight Management/Obesity: Establish reasonable short term and long term weight goals.    Admit Weight 202 lb (91.6 kg)    Goal Weight: Short Term 197 lb (89.4 kg)    Goal Weight: Long Term 180 lb (81.6 kg)   per patient   Expected Outcomes Short Term: Continue to assess and modify interventions until short term weight is achieved;Long Term: Adherence to nutrition and physical activity/exercise program aimed toward attainment of established weight goal;Weight Loss: Understanding of general recommendations for a balanced deficit meal plan, which promotes 1-2 lb weight loss per week and includes a negative energy balance of [phone] kcal/d;Understanding recommendations for  meals to include 15-35% energy as protein, 25-35% energy from fat, 35-60% energy  from carbohydrates, less than '200mg'$  of dietary cholesterol, 20-35 gm of total fiber daily;Understanding of distribution of calorie intake throughout the day with the consumption of 4-5 meals/snacks    Lipids Yes    Intervention Provide education and support for participant on nutrition & aerobic/resistive exercise along with prescribed medications to achieve LDL '70mg'$ , HDL >'40mg'$ .    Expected Outcomes Short Term: Participant states understanding of desired cholesterol values and is compliant with medications prescribed. Participant is following exercise prescription and nutrition guidelines.;Long Term: Cholesterol controlled with medications as prescribed, with individualized exercise RX and with personalized nutrition plan. Value goals: LDL < '70mg'$ , HDL > 40 mg.             Education:Diabetes - Individual verbal and written instruction to review signs/symptoms of diabetes, desired ranges of glucose level fasting, after meals and with exercise. Acknowledge that pre and post exercise glucose checks will be done for 3 sessions at entry of program.   Core Components/Risk Factors/Patient Goals Review:    Core Components/Risk Factors/Patient Goals at Discharge (Final Review):    ITP Comments:  ITP Comments     Row Name 03/30/22 1438 04/09/22 1557 04/15/22 0716       ITP Comments Virtual orientation call completed today. he has an appointment on Date: 04/09/2022  for EP eval and gym Orientation.  Documentation of diagnosis can be found in [facility] Date: 03/02/2022 . Completed 6MWT and gym orientation. Initial ITP created and sent for review to Dr. Ramonita Lab. 30 Day review completed. Medical Director ITP review done, changes made as directed, and signed approval by Medical Director.              Comments:

## 2022-04-15 NOTE — ED Provider Notes (Signed)
[facility] Provider Note    Event Date/Time   First MD Initiated Contact with Patient 04/15/22 [phone]     (approximate)   History   Chest Pain   HPI  Richard Warren is a 66 y.o. male  HLD, GERD with recent NSTEMI with 2 stents placed who comes in with chest pain.  I reviewed the records and patient was admitted from 6/26 until 6/29 and treated with PCI to diagonal and left circumflex with a plan for long-term DAPT on aspirin and Brilinta. patient did have sinus arrest after catheterization and therefore no beta-blockers or calcium channel blockers were recommended.   Patient reports some right-sided chest pain that started at 545 this morning worse with inspiration with associated shortness of breath.  Patient reports that he did not take anything for the pain the pain is now since mostly resolved.  Denies any abdominal pain, rashes, leg swelling.  He does report having a really bad leg hematoma on his right leg after having the catheterization but that has since resolved.  He denies any swelling in 1 leg, risk factors for PE.  Does not smoke.     Physical Exam   Triage Vital Signs: ED Triage Vitals  Enc Vitals Group     BP 04/15/22 0728 138/85     Pulse Rate 04/15/22 0728 61     Resp 04/15/22 0728 18     Temp 04/15/22 0728 98.1 F (36.7 C)     Temp Source 04/15/22 0728 Oral     SpO2 04/15/22 0728 97 %     Weight 04/15/22 0725 200 lb (90.7 kg)     Height 04/15/22 0725 '5\' 9"'$  (1.753 m)     Head Circumference --      Peak Flow --      Pain Score 04/15/22 0725 1     Pain Loc --      Pain Edu? --      Excl. in [place]? --     Most recent vital signs: Vitals:   04/15/22 0728  BP: 138/85  Pulse: 61  Resp: 18  Temp: 98.1 F (36.7 C)  SpO2: 97%     General: Awake, no distress.  CV:  Good peripheral perfusion.  No rash noted over the right chest wall Resp:  Normal effort.  Abd:  No distention.  Soft and nontender Other:  No swelling the legs.   No calf tenderness Groin area was inspected and there is no evidence of any redness or erythema.  His feet were inspected without any redness or erythema.  Good distal pulses.   ED Results / Procedures / Treatments   Labs (all labs ordered are listed, but only abnormal results are displayed) Labs Reviewed  CBC - Abnormal; Notable for the following components:      Result Value   WBC 2.8 (*)    All other components within normal limits  COMPREHENSIVE METABOLIC PANEL - Abnormal; Notable for the following components:   Potassium 3.4 (*)    Chloride 115 (*)    CO2 21 (*)    Glucose, Bld 107 (*)    Calcium 8.1 (*)    All other components within normal limits  CBC WITH DIFFERENTIAL/PLATELET - Abnormal; Notable for the following components:   WBC 2.8 (*)    All other components within normal limits  CBC WITH DIFFERENTIAL/PLATELET - Abnormal; Notable for the following components:   WBC 2.9 (*)    Neutro Abs 1.2 (*)  All other components within normal limits  SARS CORONAVIRUS 2 BY RT PCR  LIPASE, BLOOD  D-DIMER, QUANTITATIVE  DIFFERENTIAL  TROPONIN I (HIGH SENSITIVITY)  TROPONIN I (HIGH SENSITIVITY)     EKG  My interpretation of EKG:  Sinus bradycardia rate of 56 without any ST elevation or T wave inversion except for aVL with normal intervals  RADIOLOGY I have reviewed the xray personally and interpreted and no evidence of any pneumonia   PROCEDURES:  Critical Care performed: NO  .1-3 Lead EKG Interpretation  Performed by: Vanessa Barataria, MD Authorized by: Vanessa [place], MD     Interpretation: normal     ECG rate:  60   Rhythm: sinus rhythm     Ectopy: none     Conduction: normal   Comments:     Occasional sinus bradycardia    MEDICATIONS ORDERED IN ED: Medications - No data to display   IMPRESSION / MDM / [place] / ED COURSE  I reviewed the triage vital signs and the nursing notes.   Patient's presentation is most consistent with acute  presentation with potential threat to life or bodily function.   Differential includes ACS, pneumothorax, PE, atypical chest pain.  Patient reports pain is not very minimal.  Patient declined pain medication at this time.  Patient is low risk based on Wells criteria for PE therefore we will start off with D-dimer.  CBC shows low white count.  He typically runs around the 5 range down to 2.8 today.  Will get differential on and the COVID test to make sure no evidence of other infection but he denies any infectious cause at this time.  D-dimer negative, troponin negative  CMP shows slightly low potassium slightly elevated chloride but has had that previously. Troponin was negative   8:44 AM Reevaluated patient he continues to deny any chest pain.  Patient understands we need to wait for repeat troponin given onset of pain  Patient's differential overall reassuring.  He continues to deny any infectious symptoms.  We discussed return precautions in regards to this but otherwise can follow-up with her primary care doctor for repeat white count testing.  He expressed understanding.  Recent HIV screening was -20-monthago.  We discussed return precautions in regards to fevers  On repeat assessment his repeat troponin is negative he remains chest pain-free for over 4 hours.  Discussed the case with Dr. KChancy Milroywho can follow him up in office tomorrow at 9 AM.  We discussed that I cannot predict future heart attacks and if he has return of chest pain to return to the ER but at this time he can follow-up with Dr. KYancey Flemingstomorrow  The patient is on the cardiac monitor to evaluate for evidence of arrhythmia and/or significant heart rate changes.      FINAL CLINICAL IMPRESSION(S) / ED DIAGNOSES   Final diagnoses:  Chest pain, unspecified type     Rx / DC Orders   ED Discharge Orders     None        Note:  This document was prepared using Dragon voice recognition software and may include  unintentional dictation errors.   FVanessa Leona MD 04/15/22 1150

## 2022-04-15 NOTE — Progress Notes (Signed)
Daily Session Note  Patient Details  Name: Richard Warren MRN: [id number] Date of Birth: 07-14-1956 Referring Provider:   Flowsheet Row Cardiac Rehab from 04/09/2022 in [facility] Cardiac and Pulmonary Rehab  Referring Provider Neoma Laming MD       Encounter Date: 04/15/2022  Check In:  Session Check In - 04/15/22 1543       Check-In   Supervising physician immediately available to respond to emergencies See telemetry face sheet for immediately available ER MD    Location [facility]-Cardiac & Pulmonary Rehab    Staff Present Antionette Fairy, BS, Exercise Physiologist;Laureen Owens Shark, BS, RRT, CPFT;Susanne Bice, RN, BSN, Jacklynn Bue, MS, ASCM CEP, Exercise Physiologist    Virtual Visit No    Medication changes reported     No    Fall or balance concerns reported    No    Warm-up and Cool-down Performed on first and last piece of equipment    Resistance Training Performed Yes    VAD Patient? No    PAD/SET Patient? No      Pain Assessment   Currently in Pain? No/denies    Multiple Pain Sites No                Social History   Tobacco Use  Smoking Status Never  Smokeless Tobacco Never    Goals Met:  Independence with exercise equipment Exercise tolerated well No report of concerns or symptoms today  Goals Unmet:  Not Applicable  Comments: First full day of exercise!  Patient was oriented to gym and equipment including functions, settings, policies, and procedures.  Patient's individual exercise prescription and treatment plan were reviewed.  All starting workloads were established based on the results of the 6 minute walk test done at initial orientation visit.  The plan for exercise progression was also introduced and progression will be customized based on patient's performance and goals.    Dr. Emily Filbert is Medical Director for [place].  Dr. Ottie Glazier is Medical Director for [facility] Pulmonary Rehabilitation.

## 2022-04-15 NOTE — ED Notes (Deleted)
Breakfast tray placed at bedside, pt sleeping.

## 2022-04-15 NOTE — ED Triage Notes (Signed)
Pt to ED POV for right sided chest pain that started at 0545 this morning, worsening with inspiration. +shob. Denies n.v Recent heart cath and stent placement

## 2022-04-15 NOTE — ED Notes (Signed)
Patient transported to X-ray 

## 2022-04-15 NOTE — Discharge Instructions (Addendum)
Your heart markers were negative this time there is no signs of any blood clots.  Your white count was slightly low and if develop any fevers return to the ER otherwise can have this rechecked with your primary care doctor.  You are high risk for heart attack and I discussed with Dr. Humphrey Rolls and they will see you tomorrow at 9 AM if you have return of the chest plain prior to that please return to the ER immediately.

## 2022-04-15 NOTE — ED Notes (Signed)
Pt. returned from XR. 

## 2022-04-15 NOTE — ED Notes (Signed)
Pt laying in bed comfortably, no pain at this time

## 2022-04-20 ENCOUNTER — Encounter: Payer: Managed Care, Other (non HMO) | Admitting: *Deleted

## 2022-04-20 DIAGNOSIS — I214 Non-ST elevation (NSTEMI) myocardial infarction: Secondary | ICD-10-CM

## 2022-04-20 DIAGNOSIS — Z955 Presence of coronary angioplasty implant and graft: Secondary | ICD-10-CM

## 2022-04-20 NOTE — Progress Notes (Signed)
Daily Session Note  Patient Details  Name: Richard Warren MRN: [id number] Date of Birth: 12-14-1955 Referring Provider:   Flowsheet Row Cardiac Rehab from 04/09/2022 in [facility] Cardiac and Pulmonary Rehab  Referring Provider Neoma Laming MD       Encounter Date: 04/20/2022  Check In:  Session Check In - 04/20/22 1605       Check-In   Supervising physician immediately available to respond to emergencies See telemetry face sheet for immediately available ER MD    Location [facility]-Cardiac & Pulmonary Rehab    Staff Present Alberteen Sam, MA, RCEP, CCRP, Rosalio Macadamia, BS, ACSM CEP, Exercise Physiologist;Ne[place] Sherryll Burger, RN BSN;Noah Tickle, BS, Exercise Physiologist    Virtual Visit No    Medication changes reported     No    Fall or balance concerns reported    No    Warm-up and Cool-down Performed on first and last piece of equipment    Resistance Training Performed Yes    VAD Patient? No    PAD/SET Patient? No      Pain Assessment   Currently in Pain? No/denies                Social History   Tobacco Use  Smoking Status Never  Smokeless Tobacco Never    Goals Met:  Independence with exercise equipment Exercise tolerated well No report of concerns or symptoms today Strength training completed today  Goals Unmet:  Not Applicable  Comments: Pt able to follow exercise prescription today without complaint.  Will continue to monitor for progression.    Dr. Emily Filbert is Medical Director for [place].  Dr. Ottie Glazier is Medical Director for [facility] Pulmonary Rehabilitation.

## 2022-04-22 ENCOUNTER — Encounter: Payer: Managed Care, Other (non HMO) | Admitting: *Deleted

## 2022-04-22 DIAGNOSIS — I214 Non-ST elevation (NSTEMI) myocardial infarction: Secondary | ICD-10-CM

## 2022-04-22 DIAGNOSIS — Z955 Presence of coronary angioplasty implant and graft: Secondary | ICD-10-CM

## 2022-04-22 NOTE — Progress Notes (Signed)
Daily Session Note  Patient Details  Name: Richard Warren MRN: [id number] Date of Birth: 06/03/1956 Referring Provider:   Flowsheet Row Cardiac Rehab from 04/09/2022 in [facility] Cardiac and Pulmonary Rehab  Referring Provider Neoma Laming MD       Encounter Date: 04/22/2022  Check In:  Session Check In - 04/22/22 1551       Check-In   Supervising physician immediately available to respond to emergencies See telemetry face sheet for immediately available ER MD    Location [facility]-Cardiac & Pulmonary Rehab    Staff Present Hope Budds, RDN, Tawanna Solo, MS, ASCM CEP, Exercise Physiologist;Donaven Criswell Sherryll Burger, RN BSN    Virtual Visit No    Medication changes reported     No    Fall or balance concerns reported    No    Warm-up and Cool-down Performed on first and last piece of equipment    Resistance Training Performed Yes    VAD Patient? No    PAD/SET Patient? No      Pain Assessment   Currently in Pain? No/denies                Social History   Tobacco Use  Smoking Status Never  Smokeless Tobacco Never    Goals Met:  Independence with exercise equipment Exercise tolerated well No report of concerns or symptoms today Strength training completed today  Goals Unmet:  Not Applicable  Comments: Pt able to follow exercise prescription today without complaint.  Will continue to monitor for progression.    Dr. Emily Filbert is Medical Director for [place].  Dr. Ottie Glazier is Medical Director for [facility] Pulmonary Rehabilitation.

## 2022-04-27 ENCOUNTER — Encounter: Payer: Managed Care, Other (non HMO) | Admitting: *Deleted

## 2022-04-27 DIAGNOSIS — I214 Non-ST elevation (NSTEMI) myocardial infarction: Secondary | ICD-10-CM

## 2022-04-27 DIAGNOSIS — Z955 Presence of coronary angioplasty implant and graft: Secondary | ICD-10-CM

## 2022-04-27 NOTE — Progress Notes (Signed)
Daily Session Note  Patient Details  Name: Richard Warren MRN: [id number] Date of Birth: [birth year] Referring Provider:   Flowsheet Row Cardiac Rehab from 04/09/2022 in [facility] Cardiac and Pulmonary Rehab  Referring Provider Neoma Laming MD       Encounter Date: 04/27/2022  Check In:  Session Check In - 04/27/22 1541       Check-In   Supervising physician immediately available to respond to emergencies See telemetry face sheet for immediately available ER MD    Location [facility]-Cardiac & Pulmonary Rehab    Staff Present Renita Papa, RN BSN;Joseph Tessie Fass, RCP,RRT,BSRT;Melissa [place], [place], LDN    Virtual Visit No    Medication changes reported     No    Fall or balance concerns reported    No    Warm-up and Cool-down Performed on first and last piece of equipment    Resistance Training Performed Yes    VAD Patient? No    PAD/SET Patient? No      Pain Assessment   Currently in Pain? No/denies                Social History   Tobacco Use  Smoking Status Never  Smokeless Tobacco Never    Goals Met:  Independence with exercise equipment Exercise tolerated well No report of concerns or symptoms today Strength training completed today  Goals Unmet:  Not Applicable  Comments: Pt able to follow exercise prescription today without complaint.  Will continue to monitor for progression.    Dr. Emily Filbert is Medical Director for [place].  Dr. Ottie Glazier is Medical Director for [facility] Pulmonary Rehabilitation.

## 2022-04-28 DIAGNOSIS — I214 Non-ST elevation (NSTEMI) myocardial infarction: Secondary | ICD-10-CM

## 2022-04-28 DIAGNOSIS — Z955 Presence of coronary angioplasty implant and graft: Secondary | ICD-10-CM

## 2022-04-28 NOTE — Progress Notes (Signed)
Completed initial RD consultation ?

## 2022-04-29 ENCOUNTER — Encounter: Payer: Managed Care, Other (non HMO) | Admitting: *Deleted

## 2022-04-29 DIAGNOSIS — I214 Non-ST elevation (NSTEMI) myocardial infarction: Secondary | ICD-10-CM

## 2022-04-29 DIAGNOSIS — Z955 Presence of coronary angioplasty implant and graft: Secondary | ICD-10-CM

## 2022-04-29 NOTE — Progress Notes (Signed)
Daily Session Note  Patient Details  Name: Richard Warren MRN: [id number] Date of Birth: 02/05/1956 Referring Provider:   Flowsheet Row Cardiac Rehab from 04/09/2022 in [facility] Cardiac and Pulmonary Rehab  Referring Provider Neoma Laming MD       Encounter Date: 04/29/2022  Check In:  Session Check In - 04/29/22 1543       Check-In   Supervising physician immediately available to respond to emergencies See telemetry face sheet for immediately available ER MD    Location [facility]-Cardiac & Pulmonary Rehab    Staff Present Justin Mend, RCP,RRT,BSRT;Melissa [place] Pillar, RDN, LDN;Mella Inclan Sherryll Burger, RN BSN    Virtual Visit No    Medication changes reported     No    Fall or balance concerns reported    No    Warm-up and Cool-down Performed on first and last piece of equipment    Resistance Training Performed Yes    VAD Patient? No    PAD/SET Patient? No      Pain Assessment   Currently in Pain? No/denies                Social History   Tobacco Use  Smoking Status Never  Smokeless Tobacco Never    Goals Met:  Independence with exercise equipment Exercise tolerated well No report of concerns or symptoms today Strength training completed today  Goals Unmet:  Not Applicable  Comments: Pt able to follow exercise prescription today without complaint.  Will continue to monitor for progression.    Dr. Emily Filbert is Medical Director for [place].  Dr. Ottie Glazier is Medical Director for [facility] Pulmonary Rehabilitation.

## 2022-05-04 ENCOUNTER — Encounter: Payer: Managed Care, Other (non HMO) | Admitting: *Deleted

## 2022-05-04 DIAGNOSIS — I214 Non-ST elevation (NSTEMI) myocardial infarction: Secondary | ICD-10-CM

## 2022-05-04 DIAGNOSIS — Z955 Presence of coronary angioplasty implant and graft: Secondary | ICD-10-CM

## 2022-05-04 NOTE — Progress Notes (Signed)
Daily Session Note  Patient Details  Name: Richard Warren MRN: [id number] Date of Birth: 05/14/1956 Referring Provider:   Flowsheet Row Cardiac Rehab from 04/09/2022 in [facility] Cardiac and Pulmonary Rehab  Referring Provider Khan, Shaukat MD       Encounter Date: 05/04/2022  Check In:  Session Check In - 05/04/22 1552       Check-In   Supervising physician immediately available to respond to emergencies See telemetry face sheet for immediately available ER MD    Location [facility]-Cardiac & Pulmonary Rehab    Staff Present Joseph Hood, RCP,RRT,BSRT;Meredith Craven, RN BSN;Melissa [place], RDN, LDN    Virtual Visit No    Medication changes reported     No    Fall or balance concerns reported    No    Warm-up and Cool-down Performed on first and last piece of equipment    Resistance Training Performed Yes    VAD Patient? No    PAD/SET Patient? No      Pain Assessment   Currently in Pain? No/denies                Social History   Tobacco Use  Smoking Status Never  Smokeless Tobacco Never    Goals Met:  Independence with exercise equipment Exercise tolerated well No report of concerns or symptoms today Strength training completed today  Goals Unmet:  Not Applicable  Comments: Pt able to follow exercise prescription today without complaint.  Will continue to monitor for progression.    Dr. Mark Miller is Medical Director for [facility] Cardiac Rehabilitation.  Dr. Fuad Aleskerov is Medical Director for [facility] Pulmonary Rehabilitation. 

## 2022-05-06 ENCOUNTER — Encounter: Payer: Managed Care, Other (non HMO) | Admitting: *Deleted

## 2022-05-06 DIAGNOSIS — I214 Non-ST elevation (NSTEMI) myocardial infarction: Secondary | ICD-10-CM | POA: Diagnosis not present

## 2022-05-06 DIAGNOSIS — Z955 Presence of coronary angioplasty implant and graft: Secondary | ICD-10-CM

## 2022-05-06 NOTE — Progress Notes (Signed)
Daily Session Note  Patient Details  Name: Richard Warren MRN: [id number] Date of Birth: 08-17-1956 Referring Provider:   Flowsheet Row Cardiac Rehab from 04/09/2022 in [facility] Cardiac and Pulmonary Rehab  Referring Provider Neoma Laming MD       Encounter Date: 05/06/2022  Check In:  Session Check In - 05/06/22 1541       Check-In   Supervising physician immediately available to respond to emergencies See telemetry face sheet for immediately available ER MD    Location [facility]-Cardiac & Pulmonary Rehab    Staff Present Hope Budds, RDN, LDN;Joseph Tessie Fass, Cristopher Estimable, RN BSN    Virtual Visit No    Medication changes reported     No    Fall or balance concerns reported    No    Warm-up and Cool-down Performed on first and last piece of equipment    Resistance Training Performed Yes    VAD Patient? No    PAD/SET Patient? No      Pain Assessment   Currently in Pain? No/denies                Social History   Tobacco Use  Smoking Status Never  Smokeless Tobacco Never    Goals Met:  Independence with exercise equipment Exercise tolerated well No report of concerns or symptoms today Strength training completed today  Goals Unmet:  Not Applicable  Comments: Pt able to follow exercise prescription today without complaint.  Will continue to monitor for progression.    Dr. Emily Filbert is Medical Director for [place].  Dr. Ottie Glazier is Medical Director for [facility] Pulmonary Rehabilitation.

## 2022-05-07 ENCOUNTER — Encounter: Payer: Managed Care, Other (non HMO) | Admitting: *Deleted

## 2022-05-07 DIAGNOSIS — I214 Non-ST elevation (NSTEMI) myocardial infarction: Secondary | ICD-10-CM

## 2022-05-07 DIAGNOSIS — Z955 Presence of coronary angioplasty implant and graft: Secondary | ICD-10-CM

## 2022-05-07 NOTE — Progress Notes (Signed)
Daily Session Note  Patient Details  Name: Richard Warren MRN: [id number] Date of Birth: [birth year] Referring Provider:   Flowsheet Row Cardiac Rehab from 04/09/2022 in [facility] Cardiac and Pulmonary Rehab  Referring Provider Neoma Laming MD       Encounter Date: 05/07/2022  Check In:  Session Check In - 05/07/22 1558       Check-In   Supervising physician immediately available to respond to emergencies See telemetry face sheet for immediately available ER MD    Location [facility]-Cardiac & Pulmonary Rehab    Staff Present Renita Papa, RN BSN;Joseph [place], RCP,RRT,BSRT;Jessica [place], [place], RCEP, CCRP, CCET    Virtual Visit No    Medication changes reported     No    Fall or balance concerns reported    No    Warm-up and Cool-down Performed on first and last piece of equipment    Resistance Training Performed Yes    VAD Patient? No    PAD/SET Patient? No      Pain Assessment   Currently in Pain? No/denies                Social History   Tobacco Use  Smoking Status Never  Smokeless Tobacco Never    Goals Met:  Independence with exercise equipment Exercise tolerated well No report of concerns or symptoms today Strength training completed today  Goals Unmet:  Not Applicable  Comments: Pt able to follow exercise prescription today without complaint.  Will continue to monitor for progression.    Dr. Emily Filbert is Medical Director for [place].  Dr. Ottie Glazier is Medical Director for [facility] Pulmonary Rehabilitation.

## 2022-05-13 ENCOUNTER — Encounter: Payer: Managed Care, Other (non HMO) | Attending: Cardiovascular Disease | Admitting: *Deleted

## 2022-05-13 DIAGNOSIS — I214 Non-ST elevation (NSTEMI) myocardial infarction: Secondary | ICD-10-CM | POA: Diagnosis present

## 2022-05-13 DIAGNOSIS — Z955 Presence of coronary angioplasty implant and graft: Secondary | ICD-10-CM | POA: Diagnosis not present

## 2022-05-13 NOTE — Progress Notes (Signed)
Daily Session Note  Patient Details  Name: Richard Warren MRN: [id number] Date of Birth: Feb 28, 1956 Referring Provider:   Flowsheet Row Cardiac Rehab from 04/09/2022 in [facility] Cardiac and Pulmonary Rehab  Referring Provider Neoma Laming MD       Encounter Date: 05/13/2022  Check In:  Session Check In - 05/13/22 1547       Check-In   Supervising physician immediately available to respond to emergencies See telemetry face sheet for immediately available ER MD    Location [facility]-Cardiac & Pulmonary Rehab    Staff Present Renita Papa, RN BSN;Laureen Owens Shark, BS, RRT, CPFT;Joseph Tessie Fass, [place]    Virtual Visit No    Medication changes reported     No    Fall or balance concerns reported    No    Warm-up and Cool-down Performed on first and last piece of equipment    Resistance Training Performed Yes    VAD Patient? No    PAD/SET Patient? No      Pain Assessment   Currently in Pain? No/denies                Social History   Tobacco Use  Smoking Status Never  Smokeless Tobacco Never    Goals Met:  Independence with exercise equipment Exercise tolerated well No report of concerns or symptoms today Strength training completed today  Goals Unmet:  Not Applicable  Comments: Pt able to follow exercise prescription today without complaint.  Will continue to monitor for progression.    Dr. Emily Filbert is Medical Director for [place].  Dr. Ottie Glazier is Medical Director for [facility] Pulmonary Rehabilitation.

## 2022-05-14 ENCOUNTER — Encounter: Payer: Managed Care, Other (non HMO) | Admitting: *Deleted

## 2022-05-14 DIAGNOSIS — I214 Non-ST elevation (NSTEMI) myocardial infarction: Secondary | ICD-10-CM

## 2022-05-14 DIAGNOSIS — Z955 Presence of coronary angioplasty implant and graft: Secondary | ICD-10-CM

## 2022-05-14 NOTE — Progress Notes (Signed)
Daily Session Note  Patient Details  Name: IZAIH KATAOKA MRN: [id number] Date of Birth: [birth year] Referring Provider:   Flowsheet Row Cardiac Rehab from 04/09/2022 in [facility] Cardiac and Pulmonary Rehab  Referring Provider Neoma Laming MD       Encounter Date: 05/14/2022  Check In:  Session Check In - 05/14/22 1539       Check-In   Supervising physician immediately available to respond to emergencies See telemetry face sheet for immediately available ER MD    Location [facility]-Cardiac & Pulmonary Rehab    Staff Present Renita Papa, RN BSN;Joseph [place], RCP,RRT,BSRT;Kara [place], [place], ASCM CEP, Exercise Physiologist    Virtual Visit No    Medication changes reported     No    Fall or balance concerns reported    No    Warm-up and Cool-down Performed on first and last piece of equipment    Resistance Training Performed Yes    VAD Patient? No      Pain Assessment   Currently in Pain? No/denies                Social History   Tobacco Use  Smoking Status Never  Smokeless Tobacco Never    Goals Met:  Independence with exercise equipment Exercise tolerated well No report of concerns or symptoms today Strength training completed today  Goals Unmet:  Not Applicable  Comments: Pt able to follow exercise prescription today without complaint.  Will continue to monitor for progression.    Dr. Emily Filbert is Medical Director for [place].  Dr. Ottie Glazier is Medical Director for [facility] Pulmonary Rehabilitation.

## 2022-05-18 ENCOUNTER — Encounter: Payer: Self-pay | Admitting: Family Medicine

## 2022-05-18 ENCOUNTER — Ambulatory Visit (INDEPENDENT_AMBULATORY_CARE_PROVIDER_SITE_OTHER): Payer: Managed Care, Other (non HMO) | Admitting: Family Medicine

## 2022-05-18 ENCOUNTER — Encounter: Payer: Managed Care, Other (non HMO) | Admitting: *Deleted

## 2022-05-18 VITALS — BP 124/87 | HR 75 | Resp 16 | Wt 197.0 lb

## 2022-05-18 DIAGNOSIS — Z955 Presence of coronary angioplasty implant and graft: Secondary | ICD-10-CM

## 2022-05-18 DIAGNOSIS — E7800 Pure hypercholesterolemia, unspecified: Secondary | ICD-10-CM

## 2022-05-18 DIAGNOSIS — I214 Non-ST elevation (NSTEMI) myocardial infarction: Secondary | ICD-10-CM | POA: Diagnosis not present

## 2022-05-18 NOTE — Progress Notes (Signed)
Daily Session Note  Patient Details  Name: Richard Warren MRN: [id number] Date of Birth: 06-19-1956 Referring Provider:   Flowsheet Row Cardiac Rehab from 04/09/2022 in [facility] Cardiac and Pulmonary Rehab  Referring Provider Neoma Laming MD       Encounter Date: 05/18/2022  Check In:  Session Check In - 05/18/22 1551       Check-In   Supervising physician immediately available to respond to emergencies See telemetry face sheet for immediately available ER MD    Location [facility]-Cardiac & Pulmonary Rehab    Staff Present Renita Papa, RN BSN;Joseph Beaver Marsh, RCP,RRT,BSRT;Noah Tickle, [place], Exercise Physiologist;Kara Eliezer Bottom, MS, ASCM CEP, Exercise Physiologist    Virtual Visit No    Medication changes reported     No    Fall or balance concerns reported    No    Warm-up and Cool-down Performed on first and last piece of equipment    Resistance Training Performed Yes    VAD Patient? No    PAD/SET Patient? No      Pain Assessment   Currently in Pain? No/denies                Social History   Tobacco Use  Smoking Status Never  Smokeless Tobacco Never    Goals Met:  Independence with exercise equipment Exercise tolerated well No report of concerns or symptoms today Strength training completed today  Goals Unmet:  Not Applicable  Comments: Pt able to follow exercise prescription today without complaint.  Will continue to monitor for progression.    Dr. Emily Filbert is Medical Director for [place].  Dr. Ottie Glazier is Medical Director for [facility] Pulmonary Rehabilitation.

## 2022-05-18 NOTE — Progress Notes (Signed)
Established patient visit  I,Emma Schupp,acting as a scribe for [name], MD.,have documented all relevant documentation on the behalf of [name], MD,as directed by  [name], MD while in the presence of [name], MD.   Patient: Richard Warren   DOB: 1956-02-11   66 y.o. Male  MRN: [id number] Visit Date: 05/18/2022  Today's healthcare provider: [name], MD   Chief Complaint  Patient presents with   Follow-up   Hyperlipidemia   Subjective    HPI  Lipid/Cholesterol, Follow-up  Last lipid panel Other pertinent labs  Lab Results  Component Value Date   CHOL 166 03/04/2022   HDL 34 (L) 03/04/2022   LDLCALC 107 (H) 03/04/2022   TRIG 123 03/04/2022   CHOLHDL 4.9 03/04/2022   Lab Results  Component Value Date   ALT 28 04/15/2022   AST 18 04/15/2022   PLT 198 04/15/2022   PLT 196 04/15/2022   PLT 209 04/15/2022   TSH 1.760 10/23/2019     He was last seen for this 2 months ago.  Management since that visit includes; Follow-up in a few weeks with lipids and Met C.   The ASCVD Risk score (Arnett DK, et al., 2019) failed to calculate for the following reasons:   The patient has a prior MI or stroke diagnosis  ---------------------------------------------------------------------------------------------------   Medications: Outpatient Medications Prior to Visit  Medication Sig   aspirin EC 81 MG tablet Take 1 tablet (81 mg total) by mouth daily. Swallow whole.   atorvastatin (LIPITOR) 80 MG tablet Take 1 tablet (80 mg total) by mouth every evening.   B Complex Vitamins (VITAMIN-B COMPLEX PO) Take by mouth.   Cholecalciferol (VITAMIN D-3 PO) Take by mouth. (Patient not taking: Reported on 05/18/2022)   Coenzyme Q10 (CO Q 10 PO) Take by mouth. (Patient not taking: Reported on 03/30/2022)   Cyanocobalamin (VITAMIN B12 PO) Take by mouth daily. (Patient not taking: Reported on 03/30/2022)   Lactobacillus (ACIDOPHILUS PO)  Take by mouth. (Patient not taking: Reported on 03/30/2022)   Milk Thistle 250 MG CAPS Take by mouth. (Patient not taking: Reported on 03/30/2022)   Multiple Vitamins-Minerals (MEGA BASIC PO) Take by mouth. (Patient not taking: Reported on 03/30/2022)   Omega-3 Fatty Acids (OMEGA 3 PO) Take 3,000 mg by mouth 2 (two) times daily. (Patient not taking: Reported on 03/30/2022)   Selenium 200 MCG CAPS Take by mouth daily. (Patient not taking: Reported on 03/30/2022)   ticagrelor (BRILINTA) 90 MG TABS tablet Take 1 tablet (90 mg total) by mouth 2 (two) times daily. (Patient not taking: Reported on 03/30/2022)   vitamin E 400 UNIT capsule Take 400 Units by mouth daily. (Patient not taking: Reported on 03/30/2022)   [DISCONTINUED] CINNAMON PO Take by mouth. (Patient not taking: Reported on 03/30/2022)   [DISCONTINUED] GARLIC PO Take by mouth. (Patient not taking: Reported on 03/30/2022)   No facility-administered medications prior to visit.    Review of Systems  Constitutional:  Negative for appetite change, chills and fever.  Respiratory:  Negative for chest tightness, shortness of breath and wheezing.   Cardiovascular:  Negative for chest pain and palpitations.  Gastrointestinal:  Negative for abdominal pain, nausea and vomiting.       Objective    BP 124/87 (BP Location: Left Arm, Patient Position: Sitting, Cuff Size: Normal)   Pulse 75   Resp 16   Wt 197 lb (89.4 kg)   SpO2 99%  BMI 29.09 kg/m    Physical Exam    No results found for any visits on 05/18/22.  Assessment & Plan          Established patient visit   Patient: Richard Warren   DOB: 07/17/1955   66 y.o. Male  MRN: [id number] Visit Date: 05/25/2022  Today's healthcare provider: [name], MD   No chief complaint on file.  Subjective    HPI  Patient is here for follow up on Welcome to Medicare visit on 05/04/2022.  Medications: Outpatient Medications Prior to Visit  Medication Sig   buPROPion  (WELLBUTRIN XL) 300 MG 24 hr tablet TAKE 1 TABLET(300 MG) BY MOUTH DAILY (Patient not taking: Reported on 05/04/2022)   donepezil (ARICEPT) 10 MG tablet Take 1 tablet (10 mg total) by mouth at bedtime.   DULoxetine (CYMBALTA) 30 MG capsule Take 1 capsule (30 mg total) by mouth daily.   estrogen, conjugated,-medroxyprogesterone (PREMPRO) 0.45-1.5 MG tablet Take 1 tablet by mouth daily.   meloxicam (MOBIC) 15 MG tablet Take 15 mg by mouth daily.   phentermine 30 MG capsule Take 1 capsule (30 mg total) by mouth every morning. (Patient not taking: Reported on 05/04/2022)   propranolol (INDERAL) 40 MG tablet Take 1 tablet (40 mg total) by mouth daily.   rosuvastatin (CRESTOR) 10 MG tablet TAKE 1 TABLET BY MOUTH EVERY DAY   scopolamine (TRANSDERM-SCOP) 1 MG/3DAYS Place 1 patch (1.5 mg total) onto the skin every 3 (three) days. (Patient not taking: Reported on 05/04/2022)   valACYclovir (VALTREX) 500 MG tablet TAKE 1 TABLET BY MOUTH TWICE A DAY AS NEEDED   No facility-administered medications prior to visit.         Objective    LMP  (LMP Unknown)    Physical Exam  No results found.   No results found for any visits on 05/25/22.  Assessment & Plan     Does not appear to be E&M visit. Provider no longer with this organization. No E&M will be charged for this encounter.         Richard Cranford Mon, MD  [facility] [phone] (phone) [phone] (fax)  [place], MD  [facility] [phone] (phone) [phone] (fax)  [place]

## 2022-05-20 ENCOUNTER — Encounter: Payer: Self-pay | Admitting: Family Medicine

## 2022-05-20 ENCOUNTER — Ambulatory Visit (INDEPENDENT_AMBULATORY_CARE_PROVIDER_SITE_OTHER): Payer: Managed Care, Other (non HMO) | Admitting: Family Medicine

## 2022-05-20 ENCOUNTER — Encounter: Payer: Managed Care, Other (non HMO) | Admitting: *Deleted

## 2022-05-20 VITALS — BP 129/77 | HR 76 | Temp 97.9°F | Resp 16 | Wt 199.3 lb

## 2022-05-20 DIAGNOSIS — E7800 Pure hypercholesterolemia, unspecified: Secondary | ICD-10-CM | POA: Diagnosis not present

## 2022-05-20 DIAGNOSIS — D72819 Decreased white blood cell count, unspecified: Secondary | ICD-10-CM | POA: Insufficient documentation

## 2022-05-20 DIAGNOSIS — R1031 Right lower quadrant pain: Secondary | ICD-10-CM

## 2022-05-20 DIAGNOSIS — D72818 Other decreased white blood cell count: Secondary | ICD-10-CM

## 2022-05-20 DIAGNOSIS — I214 Non-ST elevation (NSTEMI) myocardial infarction: Secondary | ICD-10-CM

## 2022-05-20 DIAGNOSIS — Z955 Presence of coronary angioplasty implant and graft: Secondary | ICD-10-CM

## 2022-05-20 NOTE — Assessment & Plan Note (Signed)
Chronic, controlled  patient will continue atorvastatin '80mg'$  daily  Lipid panel collected today

## 2022-05-20 NOTE — Assessment & Plan Note (Addendum)
Acute Some tenderness on exam, no rebound tenderness, no guarding  Patient able to climb on and off of exam table without signs of distress  This does not appear to be an acute abdomen, VS are all within normal limits  Discomfort does not appear to be appendicitis, most likely MSK related given it occurs with movement and bending  Will monitor this  Discussed return to care precautions including nausea/emesis, hematochezia/melena, persistent or worsening pain  Can consider DX of diverticulosis, although less likely given no stool changes  Will check CMP for electrolyte abnormalities and CBC in the event he has WBC changes  Patient is afebrile today

## 2022-05-20 NOTE — Assessment & Plan Note (Signed)
New problem  Patient does not have symptoms of acute illness  Reviewed CBC from previous ED visit and WBC decreased to 2.8 and 2.9  Will check CBC today and follow up will depend on results

## 2022-05-20 NOTE — Progress Notes (Signed)
Daily Session Note  Patient Details  Name: CHIGOZIE BASALDUA MRN: [id number] Date of Birth: 06-18-1956 Referring Provider:   Flowsheet Row Cardiac Rehab from 04/09/2022 in [facility] Cardiac and Pulmonary Rehab  Referring Provider Neoma Laming MD       Encounter Date: 05/20/2022  Check In:  Session Check In - 05/20/22 1609       Check-In   Supervising physician immediately available to respond to emergencies See telemetry face sheet for immediately available ER MD    Location [facility]-Cardiac & Pulmonary Rehab    Staff Present Justin Mend, RCP,RRT,BSRT;Iviana Blasingame Sherryll Burger, RN BSN;Noah Tickle, BS, Exercise Physiologist    Virtual Visit No    Medication changes reported     No    Fall or balance concerns reported    No    Warm-up and Cool-down Performed on first and last piece of equipment    Resistance Training Performed Yes    VAD Patient? No    PAD/SET Patient? No      Pain Assessment   Currently in Pain? No/denies                Social History   Tobacco Use  Smoking Status Never  Smokeless Tobacco Never    Goals Met:  Independence with exercise equipment Exercise tolerated well No report of concerns or symptoms today Strength training completed today  Goals Unmet:  Not Applicable  Comments: Pt able to follow exercise prescription today without complaint.  Will continue to monitor for progression.    Dr. Emily Filbert is Medical Director for [facility].  Dr. Ottie Glazier is Medical Director for [facility] Pulmonary Rehabilitation.

## 2022-05-20 NOTE — Progress Notes (Signed)
Established patient visit  I,[name],acting as a scribe for [employer], MD.,have documented all relevant documentation on the behalf of Eulis Foster, MD,as directed by  Eulis Foster, MD while in the presence of Eulis Foster, MD.   Patient: Richard Warren   DOB: May 23, 1956   66 y.o. Male  MRN: [id number] Visit Date: 05/20/2022  Today's healthcare provider: Eulis Foster, MD   Chief Complaint  Patient presents with   Follow-up   Subjective    HPI  Lipid/Cholesterol, Follow-up  Last lipid panel Other pertinent labs  Lab Results  Component Value Date   CHOL 166 03/04/2022   HDL 34 (L) 03/04/2022   LDLCALC 107 (H) 03/04/2022   TRIG 123 03/04/2022   CHOLHDL 4.9 03/04/2022   Lab Results  Component Value Date   ALT 28 04/15/2022   AST 18 04/15/2022   PLT 198 04/15/2022   PLT 196 04/15/2022   PLT 209 04/15/2022   TSH 1.760 10/23/2019     He was last seen for this 2 months ago.  Management since that visit includes Follow-up in a few weeks with lipids and Met C. The patient has a prior MI or stroke diagnosis. Symptoms: No chest pain No chest pressure/discomfort  No dyspnea No lower extremity edema  No numbness or tingling of extremity No orthopnea  No palpitations No paroxysmal nocturnal dyspnea  No speech difficulty No syncope   ---------------------------------------------------------------------------------------------------  RLQ Pain  Reports mild discomfort intermittently in his right lower quadrant He reports daily, non bloody or melenic stools  He denies presence of nausea  Pain is non-radiating  Pain is not similar to pain he has had with kidney stones  Describes discomfort as feeling "sore" and associated with bending in a certain way   Leukopenia  Patient was evaluated in the ED on 04/15/2022 and was told that his WBC were low  He states that this is a new issue for him  Today he  denies fevers, chills, malaise   Medications: Outpatient Medications Prior to Visit  Medication Sig   aspirin EC 81 MG tablet Take 1 tablet (81 mg total) by mouth daily. Swallow whole.   atorvastatin (LIPITOR) 80 MG tablet Take 1 tablet (80 mg total) by mouth every evening.   Coenzyme Q10 (CO Q 10 PO) Take by mouth.   Magnesium 250 MG TABS Take by mouth.   Milk Thistle 250 MG CAPS Take by mouth.   Omega-3 Fatty Acids (OMEGA 3 PO) Take 3,000 mg by mouth 2 (two) times daily.   vitamin E 400 UNIT capsule Take 400 Units by mouth daily.   ticagrelor (BRILINTA) 90 MG TABS tablet Take 1 tablet (90 mg total) by mouth 2 (two) times daily. (Patient not taking: Reported on 03/30/2022)   [DISCONTINUED] B Complex Vitamins (VITAMIN-B COMPLEX PO) Take by mouth. (Patient not taking: Reported on 05/20/2022)   [DISCONTINUED] Cholecalciferol (VITAMIN D-3 PO) Take by mouth. (Patient not taking: Reported on 05/18/2022)   [DISCONTINUED] Cyanocobalamin (VITAMIN B12 PO) Take by mouth daily. (Patient not taking: Reported on 03/30/2022)   [DISCONTINUED] Lactobacillus (ACIDOPHILUS PO) Take by mouth. (Patient not taking: Reported on 03/30/2022)   [DISCONTINUED] Multiple Vitamins-Minerals (MEGA BASIC PO) Take by mouth. (Patient not taking: Reported on 03/30/2022)   [DISCONTINUED] Selenium 200 MCG CAPS Take by mouth daily. (Patient not taking: Reported on 03/30/2022)   No facility-administered medications prior to visit.    Review of Systems     Objective    BP  129/77 (BP Location: Left Arm, Patient Position: Sitting, Cuff Size: Normal)   Pulse 76   Temp 97.9 F (36.6 C) (Oral)   Resp 16   Wt 199 lb 4.8 oz (90.4 kg)   BMI 29.43 kg/m    Physical Exam Eyes:     General: No scleral icterus. Cardiovascular:     Rate and Rhythm: Normal rate and regular rhythm.     Pulses: Normal pulses.     Heart sounds: Normal heart sounds.  Pulmonary:     Effort: Pulmonary effort is normal. No respiratory distress.      Breath sounds: Normal breath sounds. No wheezing, rhonchi or rales.  Abdominal:     General: Bowel sounds are normal. There is no distension.     Palpations: Abdomen is soft. There is no hepatomegaly or mass.     Tenderness: There is abdominal tenderness in the right lower quadrant. There is no rebound. Negative signs include Rovsing's sign.  Neurological:     Mental Status: He is oriented to person, place, and time.      No results found for any visits on 05/20/22.   Assessment & Plan     Problem List Items Addressed This Visit       Other   Hypercholesteremia - Primary    Chronic, controlled  patient will continue atorvastatin 39m daily  Lipid panel collected today        Relevant Orders   Comprehensive metabolic panel   Lipid panel   Leucopenia    New problem  Patient does not have symptoms of acute illness  Reviewed CBC from previous ED visit and WBC decreased to 2.8 and 2.9  Will check CBC today and follow up will depend on results       Relevant Orders   CBC w/Diff/Platelet   Right lower quadrant pain    Acute Some tenderness on exam, no rebound tenderness, no guarding  Patient able to climb on and off of exam table without signs of distress  This does not appear to be an acute abdomen, VS are all within normal limits  Discomfort does not appear to be appendicitis, most likely MSK related given it occurs with movement and bending  Will monitor this  Discussed return to care precautions including nausea/emesis, hematochezia/melena, persistent or worsening pain  Can consider DX of diverticulosis, although less likely given no stool changes  Will check CMP for electrolyte abnormalities and CBC in the event he has WBC changes  Patient is afebrile today       Relevant Orders   CBC w/Diff/Platelet     No follow-ups on file.      I, MEulis Foster MD, have reviewed all documentation for this visit. The documentation on 05/20/22 for the exam,  diagnosis, procedures, and orders are all accurate and complete.    MEulis Foster MD  [phone](phone) [phone](fax)  [place]

## 2022-05-21 LAB — CBC WITH DIFFERENTIAL/PLATELET
Basophils Absolute: 0 10*3/uL (ref 0.0–0.2)
Basos: 1 %
EOS (ABSOLUTE): 0.1 10*3/uL (ref 0.0–0.4)
Eos: 1 %
Hematocrit: 46.6 % (ref 37.5–51.0)
Hemoglobin: 15.6 g/dL (ref 13.0–17.7)
Immature Grans (Abs): 0 10*3/uL (ref 0.0–0.1)
Immature Granulocytes: 0 %
Lymphocytes Absolute: 1 10*3/uL (ref 0.7–3.1)
Lymphs: 25 %
MCH: 32 pg (ref 26.6–33.0)
MCHC: 33.5 g/dL (ref 31.5–35.7)
MCV: 96 fL (ref 79–97)
Monocytes Absolute: 0.4 10*3/uL (ref 0.1–0.9)
Monocytes: 9 %
Neutrophils Absolute: 2.7 10*3/uL (ref 1.4–7.0)
Neutrophils: 64 %
Platelets: 222 10*3/uL (ref 150–450)
RBC: 4.88 x10E6/uL (ref 4.14–5.80)
RDW: 13.6 % (ref 11.6–15.4)
WBC: 4.2 10*3/uL (ref 3.4–10.8)

## 2022-05-21 LAB — LIPID PANEL
Chol/HDL Ratio: 3 ratio (ref 0.0–5.0)
Cholesterol, Total: 139 mg/dL (ref 100–199)
HDL: 46 mg/dL (ref >39)
LDL Chol Calc (NIH): 75 mg/dL (ref 0–99)
Triglycerides: 96 mg/dL (ref 0–149)
VLDL Cholesterol Cal: 18 mg/dL (ref 5–40)

## 2022-05-21 LAB — COMPREHENSIVE METABOLIC PANEL
ALT: 23 IU/L (ref 0–44)
AST: 15 IU/L (ref 0–40)
Albumin/Globulin Ratio: 1.8 (ref 1.2–2.2)
Albumin: 4.5 g/dL (ref 3.9–4.9)
Alkaline Phosphatase: 59 IU/L (ref 44–121)
BUN/Creatinine Ratio: 13 (ref 10–24)
BUN: 11 mg/dL (ref 8–27)
Bilirubin Total: 0.8 mg/dL (ref 0.0–1.2)
CO2: 21 mmol/L (ref 20–29)
Calcium: 9.2 mg/dL (ref 8.6–10.2)
Chloride: 106 mmol/L (ref 96–106)
Creatinine, Ser: 0.88 mg/dL (ref 0.76–1.27)
Globulin, Total: 2.5 g/dL (ref 1.5–4.5)
Glucose: 98 mg/dL (ref 70–99)
Potassium: 4.3 mmol/L (ref 3.5–5.2)
Sodium: 144 mmol/L (ref 134–144)
Total Protein: 7 g/dL (ref 6.0–8.5)
eGFR: 96 mL/min/{1.73_m2} (ref >59)

## 2022-06-02 ENCOUNTER — Telehealth: Payer: Self-pay

## 2022-06-02 NOTE — Telephone Encounter (Signed)
Called patient, has not been to rehab since 9/13. Patient states he was cleared to go back to work and unable to complete the program due to his work hours. Offered early AM and late PM class times and patient states he cannot accommodate those times with his work schedule. Will discharge patient at this time.

## 2022-06-03 DIAGNOSIS — I214 Non-ST elevation (NSTEMI) myocardial infarction: Secondary | ICD-10-CM

## 2022-06-03 DIAGNOSIS — Z955 Presence of coronary angioplasty implant and graft: Secondary | ICD-10-CM

## 2022-06-03 NOTE — Progress Notes (Signed)
Cardiac Individual Treatment Plan  Patient Details  Name: Richard Warren MRN: [id number] Date of Birth: March 28, 1956 Referring Provider:   Flowsheet Row Cardiac Rehab from 04/09/2022 in [facility] Cardiac and Pulmonary Rehab  Referring Provider Neoma Laming MD       Initial Encounter Date:  Flowsheet Row Cardiac Rehab from 04/09/2022 in [facility] Cardiac and Pulmonary Rehab  Date 04/09/22       Visit Diagnosis: NSTEMI (non-ST elevation myocardial infarction) [facility])  Status post coronary artery stent placement  Patient's Home Medications on Admission:  Current Outpatient Medications:    aspirin EC 81 MG tablet, Take 1 tablet (81 mg total) by mouth daily. Swallow whole., Disp: 30 tablet, Rfl: 12   atorvastatin (LIPITOR) 80 MG tablet, Take 1 tablet (80 mg total) by mouth every evening., Disp: 90 tablet, Rfl: 1   Coenzyme Q10 (CO Q 10 PO), Take by mouth., Disp: , Rfl:    Magnesium 250 MG TABS, Take by mouth., Disp: , Rfl:    Milk Thistle 250 MG CAPS, Take by mouth., Disp: , Rfl:    Omega-3 Fatty Acids (OMEGA 3 PO), Take 3,000 mg by mouth 2 (two) times daily., Disp: , Rfl:    ticagrelor (BRILINTA) 90 MG TABS tablet, Take 1 tablet (90 mg total) by mouth 2 (two) times daily. (Patient not taking: Reported on 03/30/2022), Disp: 180 each, Rfl: 3   vitamin E 400 UNIT capsule, Take 400 Units by mouth daily., Disp: , Rfl:   Past Medical History: Past Medical History:  Diagnosis Date   Colon polyp    GERD (gastroesophageal reflux disease)    Hyperlipidemia    Kidney stone     Tobacco Use: Social History   Tobacco Use  Smoking Status Never  Smokeless Tobacco Never    Labs: Review Flowsheet  More data exists      Latest Ref Rng & Units 05/14/2017 09/13/2018 10/23/2019 03/04/2022 05/20/2022  Labs for ITP Cardiac and Pulmonary Rehab  Cholestrol 100 - 199 mg/dL 186  179  165  166  139   LDL (calc) 0 - 99 mg/dL 110  87  95  107  75   HDL-C >39 mg/dL 46  46  42  34  46   Trlycerides 0 - 149 mg/dL  182  229  163  123  96      Exercise Target Goals: Exercise Program Goal: Individual exercise prescription set using results from initial 6 min walk test and THRR while considering  patient's activity barriers and safety.   Exercise Prescription Goal: Initial exercise prescription builds to 30-45 minutes a day of aerobic activity, 2-3 days per week.  Home exercise guidelines will be given to patient during program as part of exercise prescription that the participant will acknowledge.   Education: Aerobic Exercise: - Group verbal and visual presentation on the components of exercise prescription. Introduces F.I.T.T principle from ACSM for exercise prescriptions.  Reviews F.I.T.T. principles of aerobic exercise including progression. Written material given at graduation. Flowsheet Row Cardiac Rehab from 04/15/2022 in [facility] Cardiac and Pulmonary Rehab  Education need identified 04/09/22       Education: Resistance Exercise: - Group verbal and visual presentation on the components of exercise prescription. Introduces F.I.T.T principle from ACSM for exercise prescriptions  Reviews F.I.T.T. principles of resistance exercise including progression. Written material given at graduation.    Education: Exercise & Equipment Safety: - Individual verbal instruction and demonstration of equipment use and safety with use of the equipment. Flowsheet Row Cardiac  Rehab from 04/15/2022 in [facility] Cardiac and Pulmonary Rehab  Education need identified 04/09/22  Date 04/09/22  Educator [place]  Instruction Review Code 1- Verbalizes Understanding       Education: Exercise Physiology & General Exercise Guidelines: - Group verbal and written instruction with models to review the exercise physiology of the cardiovascular system and associated critical values. Provides general exercise guidelines with specific guidelines to those with heart or lung disease.    Education: Flexibility, Balance, Mind/Body  Relaxation: - Group verbal and visual presentation with interactive activity on the components of exercise prescription. Introduces F.I.T.T principle from ACSM for exercise prescriptions. Reviews F.I.T.T. principles of flexibility and balance exercise training including progression. Also discusses the mind body connection.  Reviews various relaxation techniques to help reduce and manage stress (i.e. Deep breathing, progressive muscle relaxation, and visualization). Balance handout provided to take home. Written material given at graduation.   Activity Barriers & Risk Stratification:  Activity Barriers & Cardiac Risk Stratification - 04/09/22 1604       Activity Barriers & Cardiac Risk Stratification   Activity Barriers None    Cardiac Risk Stratification Moderate             6 Minute Walk:  6 Minute Walk     Row Name 04/09/22 1604         6 Minute Walk   Phase Initial     Distance 1370 feet     Walk Time 6 minutes     MPH 0     METS 3.35     RPE 8     Perceived Dyspnea  0     VO2 Peak 11.74     Symptoms No     Resting HR 68 bpm     Resting BP 136/78     Resting Oxygen Saturation  95 %     Exercise Oxygen Saturation  during 6 min walk 96 %     Max Ex. HR 105 bpm     Max Ex. BP 140/74     2 Minute Post BP 112/72              Oxygen Initial Assessment:   Oxygen Re-Evaluation:   Oxygen Discharge (Final Oxygen Re-Evaluation):   Initial Exercise Prescription:  Initial Exercise Prescription - 04/09/22 1600       Date of Initial Exercise RX and Referring Provider   Date 04/09/22    Referring Provider Neoma Laming MD      Treadmill   MPH 2.6    Grade 1    Minutes 15    METs 3.35      NuStep   Level 3    SPM 80    Minutes 15    METs 3.3      REL-XR   Level 2    Speed 50    Minutes 15    METs 3.3      Prescription Details   Frequency (times per week) 2    Duration Progress to 30 minutes of continuous aerobic without signs/symptoms of  physical distress      Intensity   THRR 40-80% of Max Heartrate 103 - 138    Ratings of Perceived Exertion 11-13    Perceived Dyspnea 0-4      Progression   Progression Continue to progress workloads to maintain intensity without signs/symptoms of physical distress.      Resistance Training   Training Prescription Yes    Weight 5 lb    Reps  10-15             Perform Capillary Blood Glucose checks as needed.  Exercise Prescription Changes:   Exercise Prescription Changes     Row Name 04/09/22 1600 04/20/22 0900 05/04/22 0900 05/18/22 1400 06/02/22 1400     Response to Exercise   Blood Pressure (Admit) 136/78 104/66 128/76 122/72 124/70   Blood Pressure (Exercise) 140/74 132/64 148/70 134/72 142/64   Blood Pressure (Exit) 112/72 122/70 102/60 118/68 128/72   Heart Rate (Admit) 68 bpm 86 bpm 90 bpm 81 bpm 75 bpm   Heart Rate (Exercise) 105 bpm 121 bpm 129 bpm 123 bpm 124 bpm   Heart Rate (Exit) 71 bpm 94 bpm 105 bpm 97 bpm 74 bpm   Oxygen Saturation (Admit) 95 % -- -- -- --   Oxygen Saturation (Exercise) 96 % -- -- -- --   Oxygen Saturation (Exit) 96 % -- -- -- --   Rating of Perceived Exertion (Exercise) _0 Perceived Dyspnea (Exercise) 0 -- -- -- --   Symptoms _1    Comments walk test results 1st full day of exercise -- -- --   Duration -- Continue with 30 min of aerobic exercise without signs/symptoms of physical distress. Continue with 30 min of aerobic exercise without signs/symptoms of physical distress. Continue with 30 min of aerobic exercise without signs/symptoms of physical distress. Continue with 30 min of aerobic exercise without signs/symptoms of physical distress.   Intensity -- THRR unchanged THRR unchanged THRR unchanged THRR unchanged     Progression   Progression -- Continue to progress workloads to maintain intensity without signs/symptoms of physical distress. Continue to progress workloads to maintain intensity without  signs/symptoms of physical distress. Continue to progress workloads to maintain intensity without signs/symptoms of physical distress. Continue to progress workloads to maintain intensity without signs/symptoms of physical distress.   Average METs -- 3.23 3.19 3.58 3.42     Resistance Training   Training Prescription -- Yes Yes Yes Yes   Weight -- 5 lb 5 lb 5 lb 5 lb   Reps -- 10-15 10-15 10-15 10-15     Interval Training   Interval Training -- -- No No No     Treadmill   MPH -- 2.6 2.7 2.7 2.7   Grade -- _2 Minutes -- _3 METs -- 3.35 3.44 3.44 3.44     NuStep   Level -- _4 Minutes -- _5 METs -- 3.1 3.8 4.2 3.7     REL-XR   Level -- -- 2 -- 3   Minutes -- -- 15 -- 15   METs -- -- 3.2 -- 3.1     T5 Nustep   Level -- -- -- 2 --   Minutes -- -- -- 15 --   METs -- -- -- 3.1 --     Biostep-RELP   Level -- -- 2 -- --   Minutes -- -- 15 -- --   METs -- -- 2 -- --     Oxygen   Maintain Oxygen Saturation -- 88% or higher 88% or higher 88% or higher 88% or higher            Exercise Comments:   Exercise Goals and Review:   Exercise Goals     Row Name 04/09/22 [phone]  Exercise Goals   Increase Physical Activity Yes       Intervention Provide advice, education, support and counseling about physical activity/exercise needs.;Develop an individualized exercise prescription for aerobic and resistive training based on initial evaluation findings, risk stratification, comorbidities and participant's personal goals.       Expected Outcomes Long Term: Add in home exercise to make exercise part of routine and to increase amount of physical activity.;Long Term: Exercising regularly at least 3-5 days a week.;Short Term: Attend rehab on a regular basis to increase amount of physical activity.       Increase Strength and Stamina Yes       Intervention Provide advice, education, support and counseling about physical activity/exercise  needs.;Develop an individualized exercise prescription for aerobic and resistive training based on initial evaluation findings, risk stratification, comorbidities and participant's personal goals.       Expected Outcomes Short Term: Increase workloads from initial exercise prescription for resistance, speed, and METs.;Short Term: Perform resistance training exercises routinely during rehab and add in resistance training at home;Long Term: Improve cardiorespiratory fitness, muscular endurance and strength as measured by increased METs and functional capacity (6MWT)       Able to understand and use rate of perceived exertion (RPE) scale Yes       Intervention Provide education and explanation on how to use RPE scale       Expected Outcomes Short Term: Able to use RPE daily in rehab to express subjective intensity level;Long Term:  Able to use RPE to guide intensity level when exercising independently       Able to understand and use Dyspnea scale Yes       Intervention Provide education and explanation on how to use Dyspnea scale       Expected Outcomes Short Term: Able to use Dyspnea scale daily in rehab to express subjective sense of shortness of breath during exertion;Long Term: Able to use Dyspnea scale to guide intensity level when exercising independently       Knowledge and understanding of Target Heart Rate Range (THRR) Yes       Intervention Provide education and explanation of THRR including how the numbers were predicted and where they are located for reference       Expected Outcomes Short Term: Able to state/look up THRR;Long Term: Able to use THRR to govern intensity when exercising independently;Short Term: Able to use daily as guideline for intensity in rehab       Able to check pulse independently Yes       Intervention Provide education and demonstration on how to check pulse in carotid and radial arteries.;Review the importance of being able to check your own pulse for safety during  independent exercise       Expected Outcomes Short Term: Able to explain why pulse checking is important during independent exercise;Long Term: Able to check pulse independently and accurately       Understanding of Exercise Prescription Yes       Intervention Provide education, explanation, and written materials on patient's individual exercise prescription       Expected Outcomes Short Term: Able to explain program exercise prescription;Long Term: Able to explain home exercise prescription to exercise independently                Exercise Goals Re-Evaluation :  Exercise Goals Re-Evaluation     Row Name 04/20/22 0905 04/27/22 1548 05/04/22 0916 05/18/22 1447 06/02/22 1450     Exercise Goal Re-Evaluation  Exercise Goals Review Increase Physical Activity;Increase Strength and Stamina;Understanding of Exercise Prescription Increase Physical Activity;Increase Strength and Stamina;Understanding of Exercise Prescription Increase Physical Activity;Increase Strength and Stamina;Understanding of Exercise Prescription Increase Physical Activity;Increase Strength and Stamina;Understanding of Exercise Prescription Increase Physical Activity;Increase Strength and Stamina;Understanding of Exercise Prescription   Comments Richard Warren is off to a good start in rehab. He did well on his first day with the treadmill at a speed of 2.6 mph and an incline of 1%. He also tolerated level 3 on the T4 and using 5 lb hand weights for resistance training. We will continue to monitor his progress in the program. Richard Warren reports that he walks outside of rehab for about 30 minutes 1-2x/week. Reviewed THR and the importance of warming up and cooling down at home as well. EP has not gone over home exercise yet as he is only on his 6th session today. Richard Warren is doing well in rehab. He has consistently gotten above 3.2 METs during exercise. He also improved to level 4 on the T4. Richard Warren increased his workload on the treadmill as well, to a  speed of 2.7 mph and an incline of 1%. We will continue to monitor his progress in the program. Richard Warren is doing well in rehab.  He is up to 4.2 METs on the NuStep.  We will continue to monitor his progress. Richard Warren is doing well in rehab. He has consistently gotten above 3.4 METs. He also improved to level 3 on the XR. He has tolerated the treadmill at a speed of 2.7 mph and an incline of 1% as well. He would benefit from increasing his workload on the treadmill. We will continue to monitor his progress.   Expected Outcomes Short: Continue to attend rehab and follow exercise prescription. Long: Continue to increase strength and stamina. Short: Continue to attend rehab and follow exercise prescription, meet with EP to discuss home exercise. Long: Continue to increase strength and stamina. Short: Continue to increase workload on the treadmill. Long: Continue to increase overall MET level. Short: Add more incline to treadmill Long: Conitnue to improve stamina Short: increase workload on the treadmill. Long: Conitnue to improve strength and stamina            Discharge Exercise Prescription (Final Exercise Prescription Changes):  Exercise Prescription Changes - 06/02/22 1400       Response to Exercise   Blood Pressure (Admit) 124/70    Blood Pressure (Exercise) 142/64    Blood Pressure (Exit) 128/72    Heart Rate (Admit) 75 bpm    Heart Rate (Exercise) 124 bpm    Heart Rate (Exit) 74 bpm    Rating of Perceived Exertion (Exercise) 12    Symptoms none    Duration Continue with 30 min of aerobic exercise without signs/symptoms of physical distress.    Intensity THRR unchanged      Progression   Progression Continue to progress workloads to maintain intensity without signs/symptoms of physical distress.    Average METs 3.42      Resistance Training   Training Prescription Yes    Weight 5 lb    Reps 10-15      Interval Training   Interval Training No      Treadmill   MPH 2.7    Grade 1     Minutes 15    METs 3.44      NuStep   Level 3    Minutes 15    METs 3.7      REL-XR  Level 3    Minutes 15    METs 3.1      Oxygen   Maintain Oxygen Saturation 88% or higher             Nutrition:  Target Goals: Understanding of nutrition guidelines, daily intake of sodium <1543m, cholesterol <2061m calories 30% from fat and 7% or less from saturated fats, daily to have 5 or more servings of fruits and vegetables.  Education: All About Nutrition: -Group instruction provided by verbal, written material, interactive activities, discussions, models, and posters to present general guidelines for heart healthy nutrition including fat, fiber, MyPlate, the role of sodium in heart healthy nutrition, utilization of the nutrition label, and utilization of this knowledge for meal planning. Follow up email sent as well. Written material given at graduation.   Biometrics:  Pre Biometrics - 04/09/22 1604       Pre Biometrics   Height _0  (1.753 m)    Weight 202 lb (91.6 kg)    BMI (Calculated) 29.82    Single Leg Stand 30 seconds              Nutrition Therapy Plan and Nutrition Goals:  Nutrition Therapy & Goals - 04/28/22 1306       Nutrition Therapy   Diet Heart healthy, low Na    Drug/Food Interactions Statins/Certain Fruits    Protein (specify units) 70-75g    Fiber 30 grams    Whole Grain Foods 3 servings    Saturated Fats 16 max. grams    Fruits and Vegetables 8 servings/day    Sodium 2 grams      Personal Nutrition Goals   Nutrition Goal ST: continue to read food labels, practice MyPlate guidelines. LT: limit saturated fat <16g/day, limit salt <2g/day, follow MyPlate guidelines, continue with heart healthy eating when going back to work    Comments [id number]. [place] N[place] and coronary artery stent placement. PMHx inlcudes HLD, GERD. Relevant medications includes lipitor, B complex vitamin, vit D3, cinnamon PO, vit B1A07garlic PO, probiotic,  milk thistle, MVI, omega -3 FAs, selenium, vit E, CoQ 10. PYP Score: 62. Vegetables & Fruits 5/12. Breads, Grains & Cereals 8/12. Red & Processed Meat 9/12. Poultry 2/2. Fish & Shellfish 1/4. Beans, Nuts & Seeds 2/4. Milk & Dairy Foods 4/6. Toppings, Oils, Seasonings & Salt 16/20. Sweets, Snacks & Restaurant Food 9/14. Beverages 6/10. JeSonia Sideeports that his doctor advised against taking so many supplements. Discussed how many can be unnecessary (we want to get our nutrients from food first when possible), can interact with medical conditions or medications and should be discussed with his doctor before starting, and should be thrid party tested. Suggested that the B-12, CoQ10, and vit D3 would be good to continue if he wanted to remove more unnecessary supplements. JeSonia Sideeports previously having kidney stones and cannot eat leafy greens; discussed how its important to still include a wide variety of fruits and vegetables and to be mindful of the ones higher in oxalate such as spinach, include calcium rich foods with foods higher in oxalates can help to reduce risk of kidney stone formation as well as the changes he has made to his diet including increased water intake, limited salt, and inclusion of whole grains, fruits, and vegetables. 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 We discussed some on the go lunches for his cooler such as whole wheat sandwiches with baked chicken or canned tuna (in water),  hummus with vegetables, yogurt with fruit, nuts/seed mixes, and dried fruit with peanut butter. Richard Warren has made many changes since his heart event and feels that there are no major barriers for him to continue with these changes. Discussed heart healthy eating.      Intervention Plan   Intervention Prescribe, educate and counsel regarding individualized specific dietary modifications aiming towards targeted core components such as weight, hypertension, lipid management, diabetes, heart failure and  other comorbidities.    Expected Outcomes Short Term Goal: Understand basic principles of dietary content, such as calories, fat, sodium, cholesterol and nutrients.;Short Term Goal: A plan has been developed with personal nutrition goals set during dietitian appointment.;Long Term Goal: Adherence to prescribed nutrition plan.             Nutrition Assessments:  MEDIFICTS Score Key: ?70 Need to make dietary changes  40-70 Heart Healthy Diet ? 40 Therapeutic Level Cholesterol Diet  Flowsheet Row Cardiac Rehab from 04/09/2022 in [facility] Cardiac and Pulmonary Rehab  Picture Your Plate Total Score on Admission 62      Picture Your Plate Scores: <27 Unhealthy dietary pattern with much room for improvement. 41-50 Dietary pattern unlikely to meet recommendations for good health and room for improvement. 51-60 More healthful dietary pattern, with some room for improvement.  >60 Healthy dietary pattern, although there may be some specific behaviors that could be improved.    Nutrition Goals Re-Evaluation:  Nutrition Goals Re-Evaluation     [place] Name 04/27/22 1544             Goals   Nutrition Goal Nutrition appt. scheduled tomorrow _0 .                Nutrition Goals Discharge (Final Nutrition Goals Re-Evaluation):  Nutrition Goals Re-Evaluation - 04/27/22 1544       Goals   Nutrition Goal Nutrition appt. scheduled tomorrow _1 .             Psychosocial: Target Goals: Acknowledge presence or absence of significant depression and/or stress, maximize coping skills, provide positive support system. Participant is able to verbalize types and ability to use techniques and skills needed for reducing stress and depression.   Education: Stress, Anxiety, and Depression - Group verbal and visual presentation to define topics covered.  Reviews how body is impacted by stress, anxiety, and depression.  Also discusses healthy ways to reduce stress and to treat/manage anxiety and  depression.  Written material given at graduation.   Education: Sleep Hygiene -Provides group verbal and written instruction about how sleep can affect your health.  Define sleep hygiene, discuss sleep cycles and impact of sleep habits. Review good sleep hygiene tips.    Initial Review & Psychosocial Screening:  Initial Psych Review & Screening - 03/30/22 1412       Initial Review   Current issues with None Identified      Family Dynamics   Good Support System? Yes   betty,wife   Concerns Recent loss of child    Comments daughter dies with sepsis in hospital       son died in a hospital.  Richard Warren tries to  not think about it.      Barriers   Psychosocial barriers to participate in program There are no identifiable barriers or psychosocial needs.      Screening Interventions   Interventions Encouraged to exercise;To provide support and resources with identified psychosocial needs;Provide feedback about the scores to participant    Expected Outcomes Short Term goal: Utilizing  psychosocial counselor, staff and physician to assist with identification of specific Stressors or current issues interfering with healing process. Setting desired goal for each stressor or current issue identified.;Long Term Goal: Stressors or current issues are controlled or eliminated.;Short Term goal: Identification and review with participant of any Quality of Life or Depression concerns found by scoring the questionnaire.;Long Term goal: The participant improves quality of Life and PHQ9 Scores as seen by post scores and/or verbalization of changes             Quality of Life Scores:   Quality of Life - 04/09/22 1601       Quality of Life   Select Quality of Life      Quality of Life Scores   Health/Function Pre 20.2 %    Socioeconomic Pre 23.29 %    Psych/Spiritual Pre 25.5 %    Family Pre 26 %    GLOBAL Pre 30 %            Scores of 19 and below usually indicate a poorer quality of life in  these areas.  A difference of  2-3 points is a clinically meaningful difference.  A difference of 2-3 points in the total score of the Quality of Life Index has been associated with significant improvement in overall quality of life, self-image, physical symptoms, and general health in studies assessing change in quality of life.  PHQ-9: Review Flowsheet       05/18/2022 04/09/2022 05/14/2017  Depression screen PHQ 2/9  Decreased Interest 0 1 0  Down, Depressed, Hopeless 0 1 0  PHQ - 2 Score 0 2 0  Altered sleeping 0 1 -  Tired, decreased energy 0 0 -  Change in appetite 0 0 -  Feeling bad or failure about yourself  0 1 -  Trouble concentrating 0 0 -  Moving slowly or fidgety/restless 0 0 -  Suicidal thoughts 0 0 -  PHQ-9 Score 0 4 -  Difficult doing work/chores Not difficult at all Not difficult at all -   Interpretation of Total Score  Total Score Depression Severity:  1-4 = Minimal depression, 5-9 = Mild depression, 10-14 = Moderate depression, 15-19 = Moderately severe depression, 20-27 = Severe depression   Psychosocial Evaluation and Intervention:  Psychosocial Evaluation - 03/30/22 1429       Psychosocial Evaluation & Interventions   Interventions Encouraged to exercise with the program and follow exercise prescription    Comments Richard Warren has no barriers to attending the program. He is ready to get started and learn all he can to manage his heart disease. He has 2 children that passed away while in the hospital. This was not recent. He stated he does not think about this all the time. He is worried about his diagnosis and what he can manage. We did review that it is very different for patients now than 50 yeaqrs ago with a heart attack. He stated his doctor told him the same. He should do well with the program.    Expected Outcomes STG Richard Warren attends all exercise and education sessions. he progresses with his exercise routine. LTG Richard Warren is able to continue his exercise progression and  manage his heart disease after discharge.    Continue Psychosocial Services  Follow up required by staff             Psychosocial Re-Evaluation:  Psychosocial Re-Evaluation     [place] Name 04/27/22 1551  Psychosocial Re-Evaluation   Current issues with Current Stress Concerns;Current Sleep Concerns       Comments He reports that his sister recently passed which is causing him stress. He goes to bible study and will go to church to help cope with stressors. He relies on [place], his wife, for support. 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       Expected Outcomes ST: use support system to help with loss LT: continue to use coping mechanisms in times of stress       Interventions Encouraged to attend Cardiac Rehabilitation for the exercise       Continue Psychosocial Services  Follow up required by staff                Psychosocial Discharge (Final Psychosocial Re-Evaluation):  Psychosocial Re-Evaluation - 04/27/22 1551       Psychosocial Re-Evaluation   Current issues with Current Stress Concerns;Current Sleep Concerns    Comments He reports that his sister recently passed which is causing him stress. He goes to bible study and will go to church to help cope with stressors. He relies on [place], his wife, for support. 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    Expected Outcomes ST: use support system to help with loss LT: continue to use coping mechanisms in times of stress    Interventions Encouraged to attend Cardiac Rehabilitation for the exercise    Continue Psychosocial Services  Follow up required by staff             Vocational Rehabilitation: Provide vocational rehab assistance to qualifying candidates.   Vocational Rehab Evaluation & Intervention:   Education: Education Goals: Education classes will be provided on a variety of topics geared toward better understanding of  heart health and risk factor modification. Participant will state understanding/return demonstration of topics presented as noted by education test scores.  Learning Barriers/Preferences:   General Cardiac Education Topics:  AED/CPR: - Group verbal and written instruction with the use of models to demonstrate the basic use of the AED with the basic ABC's of resuscitation.   Anatomy and Cardiac Procedures: - Group verbal and visual presentation and models provide information about basic cardiac anatomy and function. Reviews the testing methods done to diagnose heart disease and the outcomes of the test results. Describes the treatment choices: Medical Management, Angioplasty, or Coronary Bypass Surgery for treating various heart conditions including Myocardial Infarction, Angina, Valve Disease, and Cardiac Arrhythmias.  Written material given at graduation.   Medication Safety: - Group verbal and visual instruction to review commonly prescribed medications for heart and lung disease. Reviews the medication, class of the drug, and Warren effects. Includes the steps to properly store meds and maintain the prescription regimen.  Written material given at graduation.   Intimacy: - Group verbal instruction through game format to discuss how heart and lung disease can affect sexual intimacy. Written material given at graduation..   Know Your Numbers and Heart Failure: - Group verbal and visual instruction to discuss disease risk factors for cardiac and pulmonary disease and treatment options.  Reviews associated critical values for Overweight/Obesity, Hypertension, Cholesterol, and Diabetes.  Discusses basics of heart failure: signs/symptoms and treatments.  Introduces Heart Failure Zone chart for action plan for heart failure.  Written material given at graduation. Flowsheet Row Cardiac Rehab from 04/15/2022 in [facility] Cardiac and Pulmonary Rehab  Education need identified 04/09/22  Date 04/15/22   Educator SB  Instruction Review Code 1- Verbalizes Understanding       Infection Prevention: - Provides verbal and written material to individual with discussion of infection control including proper hand washing and proper equipment cleaning during exercise session. Flowsheet Row Cardiac Rehab from 04/15/2022 in [facility] Cardiac and Pulmonary Rehab  Education need identified 04/09/22  Date 04/09/22  Educator [place]  Instruction Review Code 1- Verbalizes Understanding       Falls Prevention: - Provides verbal and written material to individual with discussion of falls prevention and safety. Flowsheet Row Cardiac Rehab from 03/30/2022 in [facility] Cardiac and Pulmonary Rehab  Date 03/30/22  Educator SB  Instruction Review Code 1- Verbalizes Understanding       Other: -Provides group and verbal instruction on various topics (see comments)   Knowledge Questionnaire Score:  Knowledge Questionnaire Score - 04/09/22 1558       Knowledge Questionnaire Score   Pre Score 24/26             Core Components/Risk Factors/Patient Goals at Admission:  Personal Goals and Risk Factors at Admission - 04/09/22 1619       Core Components/Risk Factors/Patient Goals on Admission    Weight Management Yes;Weight Loss    Intervention Weight Management: Develop a combined nutrition and exercise program designed to reach desired caloric intake, while maintaining appropriate intake of nutrient and fiber, sodium and fats, and appropriate energy expenditure required for the weight goal.;Weight Management: Provide education and appropriate resources to help participant work on and attain dietary goals.;Weight Management/Obesity: Establish reasonable short term and long term weight goals.    Admit Weight 202 lb (91.6 kg)    Goal Weight: Short Term 197 lb (89.4 kg)    Goal Weight: Long Term 180 lb (81.6 kg)   per patient   Expected Outcomes Short Term: Continue to assess and modify interventions until short  term weight is achieved;Long Term: Adherence to nutrition and physical activity/exercise program aimed toward attainment of established weight goal;Weight Loss: Understanding of general recommendations for a balanced deficit meal plan, which promotes 1-2 lb weight loss per week and includes a negative energy balance of [phone] kcal/d;Understanding recommendations for meals to include 15-35% energy as protein, 25-35% energy from fat, 35-60% energy from carbohydrates, less than 242m of dietary cholesterol, 20-35 gm of total fiber daily;Understanding of distribution of calorie intake throughout the day with the consumption of 4-5 meals/snacks    Lipids Yes    Intervention Provide education and support for participant on nutrition & aerobic/resistive exercise along with prescribed medications to achieve LDL <713m HDL >4052m   Expected Outcomes Short Term: Participant states understanding of desired cholesterol values and is compliant with medications prescribed. Participant is following exercise prescription and nutrition guidelines.;Long Term: Cholesterol controlled with medications as prescribed, with individualized exercise RX and with personalized nutrition plan. Value goals: LDL < 64m13mDL > 40 mg.             Education:Diabetes - Individual verbal and written instruction to review signs/symptoms of diabetes, desired ranges of glucose level fasting, after meals and with exercise. Acknowledge that pre and post exercise glucose checks will be done for 3 sessions at entry of program.   Core Components/Risk Factors/Patient Goals Review:   Goals and Risk Factor Review     Row Name 04/27/22 1547             Core Components/Risk Factors/Patient Goals Review   Personal Goals Review Weight Management/Obesity;Lipids  Review Richard Warren is doing well with rehab so far. He reports his weight has been stable since starting the program; it is only his 6th session today, we will continue to check in.  He is taking all of his medications as prescribed with no issues.       Expected Outcomes ST: continue to attend rehab routinely LT: continue to monitor risk factors                Core Components/Risk Factors/Patient Goals at Discharge (Final Review):   Goals and Risk Factor Review - 04/27/22 1547       Core Components/Risk Factors/Patient Goals Review   Personal Goals Review Weight Management/Obesity;Lipids    Review Richard Warren is doing well with rehab so far. He reports his weight has been stable since starting the program; it is only his 6th session today, we will continue to check in. He is taking all of his medications as prescribed with no issues.    Expected Outcomes ST: continue to attend rehab routinely LT: continue to monitor risk factors             ITP Comments:  ITP Comments     Row Name 03/30/22 1438 04/09/22 1557 04/15/22 0716 04/28/22 1334 06/03/22 1010   ITP Comments Virtual orientation call completed today. he has an appointment on Date: 04/09/2022  for EP eval and gym Orientation.  Documentation of diagnosis can be found in [facility] Date: 03/02/2022 . Completed 6MWT and gym orientation. Initial ITP created and sent for review to Dr. Ramonita Lab. 30 Day review completed. Medical Director ITP review done, changes made as directed, and signed approval by Medical Director. Completed initial RD consultation Patient unable to accommodat times due to returning work. Will discharge at this time.            Comments: Discharge ITP

## 2022-06-03 NOTE — Progress Notes (Signed)
Discharge Progress Report  Patient Details  Name: Richard Warren MRN: [id number] Date of Birth: 07/29/1956 Referring Provider:   Flowsheet Row Cardiac Rehab from 04/09/2022 in [facility] Cardiac and Pulmonary Rehab  Referring Provider Neoma Laming MD        Number of Visits: 14  Reason for Discharge:  Early Exit:  Back to work  Smoking History:  Social History   Tobacco Use  Smoking Status Never  Smokeless Tobacco Never    Diagnosis:  NSTEMI (non-ST elevation myocardial infarction) [facility])  Status post coronary artery stent placement  ADL [facility]:   Initial Exercise Prescription:  Initial Exercise Prescription - 04/09/22 1600       Date of Initial Exercise RX and Referring Provider   Date 04/09/22    Referring Provider Neoma Laming MD      Treadmill   MPH 2.6    Grade 1    Minutes 15    METs 3.35      NuStep   Level 3    SPM 80    Minutes 15    METs 3.3      REL-XR   Level 2    Speed 50    Minutes 15    METs 3.3      Prescription Details   Frequency (times per week) 2    Duration Progress to 30 minutes of continuous aerobic without signs/symptoms of physical distress      Intensity   THRR 40-80% of Max Heartrate 103 - 138    Ratings of Perceived Exertion 11-13    Perceived Dyspnea 0-4      Progression   Progression Continue to progress workloads to maintain intensity without signs/symptoms of physical distress.      Resistance Training   Training Prescription Yes    Weight 5 lb    Reps 10-15             Discharge Exercise Prescription (Final Exercise Prescription Changes):  Exercise Prescription Changes - 06/02/22 1400       Response to Exercise   Blood Pressure (Admit) 124/70    Blood Pressure (Exercise) 142/64    Blood Pressure (Exit) 128/72    Heart Rate (Admit) 75 bpm    Heart Rate (Exercise) 124 bpm    Heart Rate (Exit) 74 bpm    Rating of Perceived Exertion (Exercise) 12    Symptoms none    Duration Continue with 30 min of  aerobic exercise without signs/symptoms of physical distress.    Intensity THRR unchanged      Progression   Progression Continue to progress workloads to maintain intensity without signs/symptoms of physical distress.    Average METs 3.42      Resistance Training   Training Prescription Yes    Weight 5 lb    Reps 10-15      Interval Training   Interval Training No      Treadmill   MPH 2.7    Grade 1    Minutes 15    METs 3.44      NuStep   Level 3    Minutes 15    METs 3.7      REL-XR   Level 3    Minutes 15    METs 3.1      Oxygen   Maintain Oxygen Saturation 88% or higher             Functional Capacity:  6 Minute Walk     Row Name 04/09/22 1604  6 Minute Walk   Phase Initial     Distance 1370 feet     Walk Time 6 minutes     MPH 0     METS 3.35     RPE 8     Perceived Dyspnea  0     VO2 Peak 11.74     Symptoms No     Resting HR 68 bpm     Resting BP 136/78     Resting Oxygen Saturation  95 %     Exercise Oxygen Saturation  during 6 min walk 96 %     Max Ex. HR 105 bpm     Max Ex. BP 140/74     2 Minute Post BP 112/72               Nutrition & Weight - Outcomes:  Pre Biometrics - 04/09/22 1604       Pre Biometrics   Height '5\' 9"'$  (1.753 m)    Weight 202 lb (91.6 kg)    BMI (Calculated) 29.82    Single Leg Stand 30 seconds              Nutrition:  Nutrition Therapy & Goals - 04/28/22 1306       Nutrition Therapy   Diet Heart healthy, low Na    Drug/Food Interactions Statins/Certain Fruits    Protein (specify units) 70-75g    Fiber 30 grams    Whole Grain Foods 3 servings    Saturated Fats 16 max. grams    Fruits and Vegetables 8 servings/day    Sodium 2 grams      Personal Nutrition Goals   Nutrition Goal ST: continue to read food labels, practice MyPlate guidelines. LT: limit saturated fat <16g/day, limit salt <2g/day, follow MyPlate guidelines, continue with heart healthy eating when going back to work     Comments 66 y.o. M admitted to [place] s/p NSTEMI and coronary artery stent placement. PMHx inlcudes HLD, GERD. Relevant medications includes lipitor, B complex vitamin, vit D3, cinnamon PO, vit T41, garlic PO, probiotic, milk thistle, MVI, omega -3 FAs, selenium, vit E, CoQ 10. PYP Score: 62. Vegetables & Fruits 5/12. Breads, Grains & Cereals 8/12. Red & Processed Meat 9/12. Poultry 2/2. Fish & Shellfish 1/4. Beans, Nuts & Seeds 2/4. Milk & Dairy Foods 4/6. Toppings, Oils, Seasonings & Salt 16/20. Sweets, Snacks & Restaurant Food 9/14. Beverages 6/10. Sonia Side reports that his doctor advised against taking so many supplements. Discussed how many can be unnecessary (we want to get our nutrients from food first when possible), can interact with medical conditions or medications and should be discussed with his doctor before starting, and should be thrid party tested. Suggested that the B-12, CoQ10, and vit D3 would be good to continue if he wanted to remove more unnecessary supplements. Sonia Side reports previously having kidney stones and cannot eat leafy greens; discussed how its important to still include a wide variety of fruits and vegetables and to be mindful of the ones higher in oxalate such as spinach, include calcium rich foods with foods higher in oxalates can help to reduce risk of kidney stone formation as well as the changes he has made to his diet including increased water intake, limited salt, and inclusion of whole grains, fruits, and vegetables. 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 We discussed some on the go lunches for his cooler such as whole wheat sandwiches with baked chicken or canned tuna (in water),  hummus with vegetables, yogurt with fruit, nuts/seed mixes, and dried fruit with peanut butter. Sonia Side has made many changes since his heart event and feels that there are no major barriers for him to continue with these changes. Discussed heart healthy eating.      Intervention Plan    Intervention Prescribe, educate and counsel regarding individualized specific dietary modifications aiming towards targeted core components such as weight, hypertension, lipid management, diabetes, heart failure and other comorbidities.    Expected Outcomes Short Term Goal: Understand basic principles of dietary content, such as calories, fat, sodium, cholesterol and nutrients.;Short Term Goal: A plan has been developed with personal nutrition goals set during dietitian appointment.;Long Term Goal: Adherence to prescribed nutrition plan.              Goals reviewed with patient; copy given to patient.

## 2022-06-29 ENCOUNTER — Encounter: Payer: Managed Care, Other (non HMO) | Admitting: Family Medicine

## 2022-07-06 ENCOUNTER — Encounter: Payer: Self-pay | Admitting: Family Medicine

## 2022-07-06 ENCOUNTER — Ambulatory Visit (INDEPENDENT_AMBULATORY_CARE_PROVIDER_SITE_OTHER): Payer: Managed Care, Other (non HMO) | Admitting: Family Medicine

## 2022-07-06 VITALS — BP 131/85 | HR 76 | Temp 97.8°F | Resp 16 | Ht 69.0 in | Wt 202.8 lb

## 2022-07-06 DIAGNOSIS — Z0000 Encounter for general adult medical examination without abnormal findings: Secondary | ICD-10-CM | POA: Diagnosis not present

## 2022-07-06 DIAGNOSIS — Z23 Encounter for immunization: Secondary | ICD-10-CM | POA: Diagnosis not present

## 2022-07-06 DIAGNOSIS — R238 Other skin changes: Secondary | ICD-10-CM | POA: Diagnosis not present

## 2022-07-06 DIAGNOSIS — Z1211 Encounter for screening for malignant neoplasm of colon: Secondary | ICD-10-CM | POA: Diagnosis not present

## 2022-07-06 NOTE — Assessment & Plan Note (Signed)
Referral go GI for colonoscopy submitted  Discussed COVID and shingles vaccines, patient agreeable to shingles vaccine today  UTD on influenza vaccine  Reports receiving tetanus booster recently

## 2022-07-06 NOTE — Assessment & Plan Note (Signed)
Referral submitted for colonoscopy  

## 2022-07-06 NOTE — Assessment & Plan Note (Signed)
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

## 2022-07-06 NOTE — Assessment & Plan Note (Signed)
Patient is agreeable to shingles vaccine

## 2022-07-06 NOTE — Progress Notes (Signed)
I,[name],acting as a scribe for [employer], MD.,have documented all relevant documentation on the behalf of Eulis Foster, MD,as directed by  Eulis Foster, MD while in the presence of Eulis Foster, MD.   Complete physical exam   Patient: Richard Warren   DOB: [birth year]   66 y.o. Male  MRN: [id number] Visit Date: 07/06/2022  Today's healthcare provider: Eulis Foster, MD   Chief Complaint  Patient presents with   Annual Exam   Subjective    Richard Warren is a 66 y.o. male who presents today for a complete physical exam.   He reports consuming a  well balanced  diet. The patient does not participate in regular exercise at present. He generally feels well. He reports sleeping fairly well. He does not have additional problems to discuss today.   Vaccine  Recommended COVID vaccine, patient declines  Recommended Shingles vaccine, patient is agreeable to receive today   Skin Changes  Patient is requesting referral to derm for mole on his back and an area on his left cheek  He denies that the area on his back bleeds It is nontender and has been present for several years  The area on his cheek has been present for several years and often flakes off  He denies that the area bleeds or drains  Past Medical History:  Diagnosis Date   Colon polyp    GERD (gastroesophageal reflux disease)    Hyperlipidemia    Kidney stone    Past Surgical History:  Procedure Laterality Date   APPENDECTOMY  1968?   COLONOSCOPY  06-28-09   Dr Bary Castilla   CORONARY STENT INTERVENTION N/A 03/03/2022   Procedure: CORONARY STENT INTERVENTION;  Surgeon: Nelva Bush, MD;  Location: [facility];  Service: Cardiovascular;  Laterality: N/A;   EXTRACORPOREAL SHOCK WAVE LITHOTRIPSY Left 01/07/2017   Procedure: EXTRACORPOREAL SHOCK WAVE LITHOTRIPSY (ESWL);  Surgeon: Nickie Retort, MD;  Location: [facility];  Service:  Urology;  Laterality: Left;   LEFT HEART CATH AND CORONARY ANGIOGRAPHY N/A 03/03/2022   Procedure: LEFT HEART CATH AND CORONARY ANGIOGRAPHY;  Surgeon: Dionisio David, MD;  Location: [facility];  Service: Cardiovascular;  Laterality: N/A;   UPPER GI ENDOSCOPY  06-28-09   Dr Bary Castilla   VASECTOMY     Social History   Socioeconomic History   Marital status: Married    Spouse name: Not on file   Number of children: Not on file   Years of education: Not on file   Highest education level: Not on file  Occupational History   Not on file  Tobacco Use   Smoking status: Never   Smokeless tobacco: Never  Vaping Use   Vaping Use: Never used  Substance and Sexual Activity   Alcohol use: Yes    Alcohol/week: 53.0 standard drinks of alcohol    Types: 50 Standard drinks or equivalent, 3 Cans of beer per week    Comment: 2 days ago last drink   Drug use: No   Sexual activity: Not on file  Other Topics Concern   Not on file  Social History Narrative   Not on file   Social Determinants of Health   Financial Resource Strain: Not on file  Food Insecurity: Not on file  Transportation Needs: Not on file  Physical Activity: Not on file  Stress: Not on file  Social Connections: Not on file  Intimate Partner Violence: Not on file   Family Status  Relation Name Status   Mother  Deceased   Sister 1 Deceased   Brother  Alive   Daughter  69   Son  Deceased   Sister 2 Alive   Neg Hx  (Not Specified)   Family History  Problem Relation Age of Onset   Dementia Mother    Heart attack Sister    Hypertension Brother    Coronary artery disease Sister    Prostate cancer Neg Hx    Bladder Cancer Neg Hx    Kidney cancer Neg Hx    Allergies  Allergen Reactions   Zolpidem     Depression    Patient Care Team: Eulis Foster, MD as PCP - General (Family Medicine) Jac Canavan, MD (Unknown Physician Specialty) Robert Bellow, MD (General Surgery)    Medications: Outpatient Medications Prior to Visit  Medication Sig   aspirin EC 81 MG tablet Take 1 tablet (81 mg total) by mouth daily. Swallow whole.   atorvastatin (LIPITOR) 80 MG tablet Take 1 tablet (80 mg total) by mouth every evening.   Coenzyme Q10 (CO Q 10 PO) Take by mouth.   Magnesium 250 MG TABS Take by mouth.   Milk Thistle 250 MG CAPS Take by mouth.   Omega-3 Fatty Acids (OMEGA 3 PO) Take 3,000 mg by mouth 2 (two) times daily.   ticagrelor (BRILINTA) 90 MG TABS tablet Take 1 tablet (90 mg total) by mouth 2 (two) times daily. (Patient not taking: Reported on 03/30/2022)   vitamin E 400 UNIT capsule Take 400 Units by mouth daily.   No facility-administered medications prior to visit.    Review of Systems  All other systems reviewed and are negative.     Objective    BP 131/85 (BP Location: Right Arm, Patient Position: Sitting, Cuff Size: Large)   Pulse 76   Temp 97.8 F (36.6 C) (Oral)   Resp 16   Ht '5\' 9"'$  (1.753 m)   Wt 202 lb 12.8 oz (92 kg)   BMI 29.95 kg/m     Physical Exam Vitals reviewed.  Constitutional:      General: He is not in acute distress.    Appearance: Normal appearance. He is not ill-appearing, toxic-appearing or diaphoretic.  HENT:     Head: Normocephalic and atraumatic.     Right Ear: Tympanic membrane and external ear normal.     Left Ear: Tympanic membrane and external ear normal.     Nose: Nose normal.     Mouth/Throat:     Mouth: Mucous membranes are moist.     Pharynx: No oropharyngeal exudate or posterior oropharyngeal erythema.  Eyes:     General: No scleral icterus.    Extraocular Movements: Extraocular movements intact.     Conjunctiva/sclera: Conjunctivae normal.     Pupils: Pupils are equal, round, and reactive to light.  Cardiovascular:     Rate and Rhythm: Normal rate and regular rhythm.     Pulses: Normal pulses.     Heart sounds: Normal heart sounds. No murmur heard. Pulmonary:     Effort: Pulmonary effort is  normal. No respiratory distress.     Breath sounds: Normal breath sounds. No stridor. No wheezing, rhonchi or rales.  Abdominal:     General: Bowel sounds are normal. There is no distension.     Palpations: Abdomen is soft.     Tenderness: There is no abdominal tenderness.  Musculoskeletal:        General: No swelling, tenderness or signs of injury.  Normal range of motion.     Cervical back: Normal range of motion and neck supple.     Right lower leg: Edema present.     Left lower leg: Edema present.     Comments: RLE >LLE   Skin:    General: Skin is warm and dry.     Capillary Refill: Capillary refill takes less than 2 seconds.     Findings: Lesion present. No erythema or rash.  Neurological:     Mental Status: He is alert and oriented to person, place, and time.     Motor: No weakness.     Gait: Gait normal.  Psychiatric:        Attention and Perception: Attention normal.        Mood and Affect: Mood normal.        Speech: Speech normal.        Behavior: Behavior normal. Behavior is cooperative.        Thought Content: Thought content normal.            Last depression screening scores    07/06/2022    3:58 PM 05/18/2022    2:48 PM 04/09/2022    3:58 PM  PHQ 2/9 Scores  PHQ - 2 Score 0 0 2  PHQ- 9 Score 0 0 4   Last fall risk screening    07/06/2022    3:58 PM  [place] in the past year? 0  Number falls in past yr: 0  Injury with Fall? 0  Risk for fall due to : No Fall Risks   Last Audit-C alcohol use screening    07/06/2022    3:59 PM  Alcohol Use Disorder Test (AUDIT)  1. How often do you have a drink containing alcohol? 3  2. How many drinks containing alcohol do you have on a typical day when you are drinking? 0  3. How often do you have six or more drinks on one occasion? 0  AUDIT-C Score 3  4. How often during the last year have you found that you were not able to stop drinking once you had started? 0  5. How often during the last year  have you failed to do what was normally expected from you because of drinking? 0  6. How often during the last year have you needed a first drink in the morning to get yourself going after a heavy drinking session? 0  7. How often during the last year have you had a feeling of guilt of remorse after drinking? 0  8. How often during the last year have you been unable to remember what happened the night before because you had been drinking? 0  9. Have you or someone else been injured as a result of your drinking? 0  10. Has a relative or friend or a doctor or another health worker been concerned about your drinking or suggested you cut down? 0  Alcohol Use Disorder Identification Test Final Score (AUDIT) 3   A score of 3 or more in women, and 4 or more in men indicates increased risk for alcohol abuse, EXCEPT if all of the points are from question 1   No results found for any visits on 07/06/22.  Assessment & Plan    Routine Health Maintenance and Physical Exam  Exercise Activities and Dietary recommendations  Goals   None     Immunization History  Administered Date(s) Administered   Tdap 09/16/2009   Zoster  Recombinat (Shingrix) 07/06/2022    Health Maintenance  Topic Date Due   COVID-19 Vaccine (1) Never done   COLONOSCOPY (Pts 45-26yr Insurance coverage will need to be confirmed)  08/15/2019   TETANUS/TDAP  09/17/2019   INFLUENZA VACCINE  12/06/2022 (Originally 04/07/2022)   Zoster Vaccines- Shingrix (2 of 2) 08/31/2022   Hepatitis C Screening  Completed   HIV Screening  Completed   HPV VACCINES  Aged Out    Discussed health benefits of physical activity, and encouraged him to engage in regular exercise appropriate for his age and condition.  Problem List Items Addressed This Visit       Musculoskeletal and Integument   Other skin changes    Back lesion appears to be benign SK Facial lesions is likely AK  Referral submitted for dermatology       Relevant Orders    Ambulatory referral to Dermatology     Other   Need for shingles vaccine    Patient is agreeable to shingles vaccine       Relevant Orders   Varicella-zoster vaccine IM (Completed)   Colon cancer screening    Referral submitted for colonoscopy       Relevant Orders   Ambulatory referral to Gastroenterology   Annual physical exam - Primary    Referral go GI for colonoscopy submitted  Discussed COVID and shingles vaccines, patient agreeable to shingles vaccine today  UTD on influenza vaccine  Reports receiving tetanus booster recently          Return in about 6 months (around 01/05/2023) for HTN, HLD.     I, MEulis Foster MD, have reviewed all documentation for this visit. The documentation on 07/06/22 for the exam, diagnosis, procedures, and orders are all accurate and complete.  Portions of this information were initially documented by the CMA and reviewed by me for thoroughness and accuracy.      MEulis Foster MD  [phone](phone) [phone](fax)  [place]

## 2022-08-24 ENCOUNTER — Ambulatory Visit: Payer: Managed Care, Other (non HMO) | Admitting: Physician Assistant

## 2022-11-02 ENCOUNTER — Encounter: Payer: Self-pay | Admitting: Cardiovascular Disease

## 2022-11-02 ENCOUNTER — Ambulatory Visit: Payer: Managed Care, Other (non HMO) | Admitting: Cardiovascular Disease

## 2022-11-02 VITALS — BP 140/90 | HR 78 | Ht 69.0 in | Wt 201.4 lb

## 2022-11-02 DIAGNOSIS — E7800 Pure hypercholesterolemia, unspecified: Secondary | ICD-10-CM | POA: Diagnosis not present

## 2022-11-02 DIAGNOSIS — I2510 Atherosclerotic heart disease of native coronary artery without angina pectoris: Secondary | ICD-10-CM | POA: Diagnosis not present

## 2022-11-02 DIAGNOSIS — I249 Acute ischemic heart disease, unspecified: Secondary | ICD-10-CM | POA: Diagnosis not present

## 2022-11-02 DIAGNOSIS — R0789 Other chest pain: Secondary | ICD-10-CM | POA: Diagnosis not present

## 2022-11-02 DIAGNOSIS — Z9861 Coronary angioplasty status: Secondary | ICD-10-CM

## 2022-11-02 MED ORDER — TICAGRELOR 90 MG PO TABS
90.0000 mg | ORAL_TABLET | Freq: Two times a day (BID) | ORAL | 3 refills | Status: DC
Start: 2022-11-02 — End: 2023-02-01

## 2022-11-02 NOTE — Progress Notes (Signed)
Cardiology Office Note   Date:  11/02/2022   ID:  Richard Warren, DOB October 15, 1955, MRN [id number]  PCP:  Eulis Foster, MD  Cardiologist:  Neoma Laming, MD      History of Present Illness: Richard Warren is a 67 y.o. male who presents for  Chief Complaint  Patient presents with   Follow-up    4 mo fu    Chest Pain  This is a chronic problem. The current episode started in the past 7 days. The onset quality is gradual. The problem occurs intermittently. The problem has been resolved. The pain is present in the lateral region. The pain is moderate.      Past Medical History:  Diagnosis Date   Colon polyp    GERD (gastroesophageal reflux disease)    Hyperlipidemia    Kidney stone      Past Surgical History:  Procedure Laterality Date   APPENDECTOMY  1968?   COLONOSCOPY  06-28-09   Dr Bary Castilla   CORONARY STENT INTERVENTION N/A 03/03/2022   Procedure: CORONARY STENT INTERVENTION;  Surgeon: Nelva Bush, MD;  Location: [place] [facility];  Service: Cardiovascular;  Laterality: N/A;   EXTRACORPOREAL SHOCK WAVE LITHOTRIPSY Left 01/07/2017   Procedure: EXTRACORPOREAL SHOCK WAVE LITHOTRIPSY (ESWL);  Surgeon: Nickie Retort, MD;  Location: [facility];  Service: Urology;  Laterality: Left;   LEFT HEART CATH AND CORONARY ANGIOGRAPHY N/A 03/03/2022   Procedure: LEFT HEART CATH AND CORONARY ANGIOGRAPHY;  Surgeon: Dionisio David, MD;  Location: [place] [facility];  Service: Cardiovascular;  Laterality: N/A;   UPPER GI ENDOSCOPY  06-28-09   Dr Bary Castilla   VASECTOMY       Current Outpatient Medications  Medication Sig Dispense Refill   acidophilus (RISAQUAD) CAPS capsule Take 1 capsule by mouth daily.     aspirin EC 81 MG tablet Take 1 tablet (81 mg total) by mouth daily. Swallow whole. 30 tablet 12   atorvastatin (LIPITOR) 80 MG tablet Take 1 tablet (80 mg total) by mouth every evening. 90 tablet 1   Coenzyme Q10 (CO Q 10 PO) Take by mouth. (Patient not  taking: Reported on 11/02/2022)     Magnesium 250 MG TABS Take by mouth. (Patient not taking: Reported on 11/02/2022)     Milk Thistle 250 MG CAPS Take by mouth. (Patient not taking: Reported on 11/02/2022)     Omega-3 Fatty Acids (OMEGA 3 PO) Take 3,000 mg by mouth 2 (two) times daily. (Patient not taking: Reported on 11/02/2022)     ticagrelor (BRILINTA) 90 MG TABS tablet Take 1 tablet (90 mg total) by mouth 2 (two) times daily. 180 tablet 3   vitamin E 400 UNIT capsule Take 400 Units by mouth daily. (Patient not taking: Reported on 11/02/2022)     No current facility-administered medications for this visit.    Allergies:   Zolpidem    Social History:   reports that he has never smoked. He has never used smokeless tobacco. He reports current alcohol use of about 53.0 standard drinks of alcohol per week. He reports that he does not use drugs.   Family History:  family history includes Coronary artery disease in his sister; Dementia in his mother; Heart attack in his sister; Hypertension in his brother.    ROS:     Review of Systems  Constitutional: Negative.   HENT: Negative.    Eyes: Negative.   Respiratory: Negative.    Cardiovascular:  Positive for chest pain.  Gastrointestinal:  Negative.   Genitourinary: Negative.   Musculoskeletal: Negative.   Skin: Negative.   Neurological: Negative.   Endo/Heme/Allergies: Negative.   Psychiatric/Behavioral: Negative.    All other systems reviewed and are negative.     All other systems are reviewed and negative.    PHYSICAL EXAM: VS:  BP (!) 140/90   Pulse 78   Ht '5\' 9"'$  (1.753 m)   Wt 201 lb 6.4 oz (91.4 kg)   SpO2 96%   BMI 29.74 kg/m  , BMI Body mass index is 29.74 kg/m. Last weight:  Wt Readings from Last 3 Encounters:  11/02/22 201 lb 6.4 oz (91.4 kg)  07/06/22 202 lb 12.8 oz (92 kg)  05/20/22 199 lb 4.8 oz (90.4 kg)     Physical Exam Vitals reviewed.  Constitutional:      Appearance: Normal appearance. He is normal  weight.  HENT:     Head: Normocephalic.     Nose: Nose normal.     Mouth/Throat:     Mouth: Mucous membranes are moist.  Eyes:     Pupils: Pupils are equal, round, and reactive to light.  Cardiovascular:     Rate and Rhythm: Normal rate and regular rhythm.     Pulses: Normal pulses.     Heart sounds: Normal heart sounds.  Pulmonary:     Effort: Pulmonary effort is normal.  Abdominal:     General: Abdomen is flat. Bowel sounds are normal.  Musculoskeletal:        General: Normal range of motion.     Cervical back: Normal range of motion.  Skin:    General: Skin is warm.  Neurological:     General: No focal deficit present.     Mental Status: He is alert.  Psychiatric:        Mood and Affect: Mood normal.       EKG:   Recent Labs: 03/03/2022: Magnesium 2.0 05/20/2022: ALT 23; BUN 11; Creatinine, Ser 0.88; Hemoglobin 15.6; Platelets 222; Potassium 4.3; Sodium 144    Lipid Panel    Component Value Date/Time   CHOL 139 05/20/2022 1030   TRIG 96 05/20/2022 1030   HDL 46 05/20/2022 1030   CHOLHDL 3.0 05/20/2022 1030   CHOLHDL 4.9 03/04/2022 0512   VLDL 25 03/04/2022 0512   LDLCALC 75 05/20/2022 1030   LDLCALC 110 (H) 05/14/2017 1556      TESTS                                                                                          [facility] [address] [place], [place] [address] [phone] STUDY:  Gated Stress / Rest Myocardial Perfusion Imaging Tomographic (SPECT) Including attenuation correction Wall Motion, Left Ventricular Ejection Fraction By Gated Technique.Treadmill Stress Test. SEX: Male  WEIGHT: 203 lbs  HEIGHT: 69 in    ARMS UP: YES/NO  REFERRING PHYSICIAN:  Dr.Ralph Benavidez Humphrey Rolls                                                                                                                                                                                                                       INDICATION FOR STUDY:  CP                                                                                                                                                                                                                    TECHNIQUE:  Approximately 20 minutes following the intravenous administration of 9.8 mCi of Tc-35mSestamibi after stress testing in a reclined supine position with arms above their head if able to do so, gated SPECT imaging of the heart was performed. After about a 2hr break, the patient was injected intravenously with 32.5 mCi of Tc-918mestamibi.  Approximately 45 minutes later in the same position as stress imaging SPECT rest imaging of the heart was performed.  STRESS BY:  ShNeoma LamingMD PROTOCOL:  BrDarnell Level  MAX PRED HR: 156                      85%: 133              75%:  117                                                                                                                  RESTING BP:  138/80  RESTING HR: 78  PEAK BP: 144/84    PEAK HR:  137 (87%)                                                                   EXERCISE DURATION: 7:00                                             METS: 8.5     REASON FOR TEST TERMINATION: Target reached/1 min post injection                                                                                                                                 SYMPTOMS: None  [facility] TREADMILL SCORE: 7                                       RISK:  Low  EKG RESULTS: NSR. 86/min. No significant ST changes at peak exercise.                                                              IMAGE QUALITY: Good                                                                                                                                                                                                                                                                                                                                   PERFUSION/WALL MOTION FINDINGS: EF = 74%. No perfusion defects, normal wall motion.                                                                           IMPRESSION:  Normal stress test with normal LVEF.  Neoma Laming, MD Stress Interpreting Physician / Nuclear Interpreting Physician                  Neoma Laming MD  Electronically signed by: Neoma Laming     Date: 05/05/2022 09:09 Other studies Reviewed: Additional studies/ records that were reviewed today include:  Review of the above records demonstrates:       No data to display            CHIEF COMPLAINT  The Chief Complaint is: Cardiac consult/ hospital follow up.     HISTORY OF PRESENT ILLNESS  Richard Warren is a 67 year old male.   preliminary background HPI [use for free text].  Allergy list reviewed   Problem list reviewed   Medication list reviewed    Dyspnea    Not feeling congested in the chest    No chest pain or discomfort   No palpitations   The heart rate was not fast     PAST MEDICAL/SURGICAL HISTORY  Diagnoses:  Coronary artery disease.   Hyperlipidemia Procedural:   PTCA Stents in distal LCX and D2 as had STEMI 6/23     SOCIAL HISTORY  Tobacco use:  Smoking status: Never smoker.     REVIEW OF SYSTEMS  Systemic: Not feeling poorly (malaise).  No recent weight change. Head: No headache and no sinus pain. Cardiovascular: Chest pain  or discomfort.  No palpitations and the heart rate was not fast. Pulmonary: Dyspnea.  No cough and no wheezing. Gastrointestinal: No heartburn.  No nausea, no vomiting, no abdominal pain, and no diarrhea. Neurological: No dizziness, no vertigo, and no fainting. Psychological: No anxiety and no depression.     PHYSICAL FINDINGS    Vitals taken 03/09/2022 09:40 am  BP-Sitting  120/84 mmHg 100 - 120/60 - 80  Pulse Rate-Sitting  106 bpm 50 - 100  Temp-Oral  97.7 F 96 - 101  Weight  202 lbs  123 - 215  Oxygen Saturation  100 % 93 - 100    General Appearance:  In no acute distress. Lungs:  Clear to auscultation. Cardiovascular: Jugular Venous Distention:  JVD not increased.   JVD not increased. Heart Rate And Rhythm:  Normal.   Normal. Heart Sounds:  Normal. Murmurs:  No murmurs were heard. Carotid Arteries:  No bruit in the carotid artery. Arterial Pulses:  Equal bilaterally and normal. Edema:  Not present. Abdomen: Auscultation:  Bowel sounds were normal. Palpation:  Abdominal non-tender. Neurological:  Oriented to time, place, and person.     ASSESSMENT   Shortness of breath  Chest pain  Post-angioplasty  Hyperlipidemia  Presyncope syndrome     THERAPY   Continue current medication.  Clinical summary provided to patient.     COUNSELING/EDUCATION   Education and counseling diet     PLAN     Chest pain, unspecified RADIOLOGY/CT SCAN: CT ABDOMEN ONLY W/O CONTRAST     Return to the clinic if condition worsens or new symptoms arise  Follow-up visit Right groin hematoma postangioplasty with tenderness, u/s in [facility] ok,  Gave percoset and do ct r/u retroperitoneal bleed.      Neoma Laming MD  Electronically signed by: Neoma Laming     Date: 03/09/2022 09:57 CHIEF COMPLAINT  The Chief Complaint is: Cardiac consult/ hospital follow up.     HISTORY OF PRESENT ILLNESS  Richard Warren is a 67 year old male.   preliminary background HPI [use for  free text].  Allergy list reviewed  Problem list reviewed   Medication list reviewed    Dyspnea    Not feeling congested in the chest    No chest pain or discomfort   No palpitations   The heart rate was not fast     PAST MEDICAL/SURGICAL HISTORY  Diagnoses:  Coronary artery disease.   Hyperlipidemia Procedural:   PTCA Stents in distal LCX and D2 as had STEMI 6/23     SOCIAL HISTORY  Tobacco use:  Smoking status: Never smoker.     REVIEW OF SYSTEMS  Systemic: Not feeling poorly (malaise).  No recent weight change. Head: No headache and no sinus pain. Cardiovascular: Chest pain or discomfort.  No palpitations and the heart rate was not fast. Pulmonary: Dyspnea.  No cough and no wheezing. Gastrointestinal: No heartburn.  No nausea, no vomiting, no abdominal pain, and no diarrhea. Neurological: No dizziness, no vertigo, and no fainting. Psychological: No anxiety and no depression.     PHYSICAL FINDINGS    Vitals taken 03/09/2022 09:40 am  BP-Sitting  120/84 mmHg 100 - 120/60 - 80  Pulse Rate-Sitting  106 bpm 50 - 100  Temp-Oral  97.7 F 96 - 101  Weight  202 lbs  123 - 215  Oxygen Saturation  100 % 93 - 100    General Appearance:  In no acute distress. Lungs:  Clear to auscultation. Cardiovascular: Jugular Venous Distention:  JVD not increased.   JVD not increased. Heart Rate And Rhythm:  Normal.   Normal. Heart Sounds:  Normal. Murmurs:  No murmurs were heard. Carotid Arteries:  No bruit in the carotid artery. Arterial Pulses:  Equal bilaterally and normal. Edema:  Not present. Abdomen: Auscultation:  Bowel sounds were normal. Palpation:  Abdominal non-tender. Neurological:  Oriented to time, place, and person.     ASSESSMENT   Shortness of breath  Chest pain  Post-angioplasty  Hyperlipidemia  Presyncope syndrome     THERAPY   Continue current medication.  Clinical summary provided to patient.     COUNSELING/EDUCATION   Education  and counseling diet     PLAN     Chest pain, unspecified RADIOLOGY/CT SCAN: CT ABDOMEN ONLY W/O CONTRAST     Return to the clinic if condition worsens or new symptoms arise  Follow-up visit Right groin hematoma postangioplasty with tenderness, u/s in [facility] ok,  Gave percoset and do ct r/u retroperitoneal bleed.      Neoma Laming MD  Electronically signed by: Neoma Laming     Date: 03/09/2022 09:57 ASSESSMENT AND PLAN:    ICD-10-CM   1. NSTEMI  I24.9     2. Hypercholesteremia  E78.00     3. Other chest pain  R07.89     4. CAD S/P percutaneous coronary angioplasty  I25.10    Z98.61    has occasional atypical chest pain, stress test ok. Cleared for DOT physical       Problem List Items Addressed This Visit       Cardiovascular and Mediastinum   NSTEMI - Primary     Other   Hypercholesteremia   Other Visit Diagnoses     Other chest pain       CAD S/P percutaneous coronary angioplasty       has occasional atypical chest pain, stress test ok. Cleared for DOT physical          Disposition:   Return in about 3 months (around 01/31/2023).    Total time spent: 30 minutes  Signed,  Neoma Laming,  MD  11/02/2022 10:44 AM    [facility]

## 2023-01-07 ENCOUNTER — Ambulatory Visit: Payer: Managed Care, Other (non HMO) | Admitting: Dermatology

## 2023-01-11 ENCOUNTER — Encounter: Payer: Self-pay | Admitting: Family Medicine

## 2023-01-11 ENCOUNTER — Ambulatory Visit: Payer: Managed Care, Other (non HMO) | Admitting: Family Medicine

## 2023-01-11 VITALS — BP 133/83 | HR 71 | Temp 98.4°F | Resp 16 | Wt 201.0 lb

## 2023-01-11 DIAGNOSIS — Z79899 Other long term (current) drug therapy: Secondary | ICD-10-CM

## 2023-01-11 DIAGNOSIS — R030 Elevated blood-pressure reading, without diagnosis of hypertension: Secondary | ICD-10-CM | POA: Diagnosis not present

## 2023-01-11 DIAGNOSIS — I10 Essential (primary) hypertension: Secondary | ICD-10-CM | POA: Insufficient documentation

## 2023-01-11 DIAGNOSIS — E7800 Pure hypercholesterolemia, unspecified: Secondary | ICD-10-CM | POA: Diagnosis not present

## 2023-01-11 NOTE — Progress Notes (Signed)
I,[name],acting as a scribe for [employer], MD.,have documented all relevant documentation on the behalf of Ronnald Ramp, MD,as directed by  Ronnald Ramp, MD while in the presence of Ronnald Ramp, MD.   Established patient visit   Patient: Richard Warren   DOB: 1955/09/28   67 y.o. Male  MRN: [id number] Visit Date: 01/11/2023  Today's healthcare provider: Ronnald Ramp, MD   Chief Complaint  Patient presents with   Follow-up   Subjective    HPI   Hypertension, follow-up  BP Readings from Last 3 Encounters:  01/11/23 133/83  11/02/22 (!) 140/90  07/06/22 131/85   Wt Readings from Last 3 Encounters:  01/11/23 201 lb (91.2 kg)  11/02/22 201 lb 6.4 oz (91.4 kg)  07/06/22 202 lb 12.8 oz (92 kg)     He was last seen for hypertension 6 months ago.  BP at that visit was 131/85. Management since that visit includes none. Patient not on any meds.  Outside blood pressures are not being checked.  Symptoms: No chest pain No chest pressure  No palpitations No syncope  No dyspnea No orthopnea  No paroxysmal nocturnal dyspnea No lower extremity edema   Pertinent labs Lab Results  Component Value Date   CHOL 139 05/20/2022   HDL 46 05/20/2022   LDLCALC 75 05/20/2022   TRIG 96 05/20/2022   CHOLHDL 3.0 05/20/2022   Lab Results  Component Value Date   NA 144 05/20/2022   K 4.3 05/20/2022   CREATININE 0.88 05/20/2022   EGFR 96 05/20/2022   GLUCOSE 98 05/20/2022   TSH 1.760 10/23/2019     The ASCVD Risk score (Arnett DK, et al., 2019) failed to calculate for the following reasons:   The patient has a prior MI or stroke diagnosis  ---------------------------------------------------------------------------------------------------  Lipid/Cholesterol, Follow-up  Last lipid panel Other pertinent labs  Lab Results  Component Value Date   CHOL 139 05/20/2022   HDL 46 05/20/2022   LDLCALC 75  05/20/2022   TRIG 96 05/20/2022   CHOLHDL 3.0 05/20/2022   Lab Results  Component Value Date   ALT 23 05/20/2022   AST 15 05/20/2022   PLT 222 05/20/2022   TSH 1.760 10/23/2019     He was last seen for this 6 months ago.  Management since that visit includes continue Atorvastatin 80mg .  Medications: Outpatient Medications Prior to Visit  Medication Sig   acidophilus (RISAQUAD) CAPS capsule Take 1 capsule by mouth daily.   aspirin EC 81 MG tablet Take 1 tablet (81 mg total) by mouth daily. Swallow whole.   atorvastatin (LIPITOR) 80 MG tablet Take 1 tablet (80 mg total) by mouth every evening.   Coenzyme Q10 (CO Q 10 PO) Take by mouth.   Magnesium 250 MG TABS Take by mouth.   Milk Thistle 250 MG CAPS Take by mouth.   Omega-3 Fatty Acids (OMEGA 3 PO) Take 3,000 mg by mouth 2 (two) times daily.   ticagrelor (BRILINTA) 90 MG TABS tablet Take 1 tablet (90 mg total) by mouth 2 (two) times daily.   vitamin E 400 UNIT capsule Take 400 Units by mouth daily.   No facility-administered medications prior to visit.    Review of Systems     Objective    BP 133/83 (BP Location: Left Arm, Patient Position: Sitting, Cuff Size: Normal)   Pulse 71   Temp 98.4 F (36.9 C) (Oral)   Resp 16   Wt 201 lb (91.2 kg)  BMI 29.68 kg/m    Physical Exam Vitals reviewed.  Constitutional:      General: He is not in acute distress.    Appearance: Normal appearance. He is not ill-appearing, toxic-appearing or diaphoretic.  Eyes:     Conjunctiva/sclera: Conjunctivae normal.  Cardiovascular:     Rate and Rhythm: Normal rate and regular rhythm.     Pulses: Normal pulses.     Heart sounds: Normal heart sounds. No murmur heard.    No friction rub. No gallop.  Pulmonary:     Effort: Pulmonary effort is normal. No respiratory distress.     Breath sounds: Normal breath sounds. No stridor. No wheezing, rhonchi or rales.  Abdominal:     General: Bowel sounds are normal. There is no distension.      Palpations: Abdomen is soft.     Tenderness: There is no abdominal tenderness.  Musculoskeletal:     Right lower leg: No edema.     Left lower leg: No edema.  Skin:    Findings: No erythema or rash.  Neurological:     Mental Status: He is alert and oriented to person, place, and time.       No results found for any visits on 01/11/23.  Assessment & Plan     Problem List Items Addressed This Visit       Other   Hypercholesteremia - Primary    -Lipid panel collected today, will follow up once results are available -Discussed diet that limits saturated and trans fats (less than 5%) -Recommended exercising for 30-40 minutes 3-4 times per week  -Continue statin therapy including atorvastatin 80mg  daily        Relevant Orders   Lipid Profile   Comprehensive metabolic panel   On statin therapy   Relevant Orders   Comprehensive metabolic panel   Elevated BP without diagnosis of hypertension    Intermittently elevated BP  Improved on recheck today  Will not start BP medication at this time  Cmp ordered today       Relevant Orders   Comprehensive metabolic panel     Return in about 4 months (around 05/14/2023) for HLD, BP .       The entirety of the information documented in the History of Present Illness, Review of Systems and Physical Exam were personally obtained by me. Portions of this information were initially documented by Hetty Ely, CMA. I, Ronnald Ramp, MD have reviewed the documentation above for thoroughness and accuracy.    Ronnald Ramp, MD  [facility] [phone] (phone) [phone] (fax)  [facility]

## 2023-01-11 NOTE — Assessment & Plan Note (Signed)
Intermittently elevated BP  Improved on recheck today  Will not start BP medication at this time  Cmp ordered today

## 2023-01-11 NOTE — Assessment & Plan Note (Signed)
-  Lipid panel collected today, will follow up once results are available -Discussed diet that limits saturated and trans fats (less than 5%) -Recommended exercising for 30-40 minutes 3-4 times per week  -Continue statin therapy including atorvastatin 80mg  daily

## 2023-01-12 ENCOUNTER — Other Ambulatory Visit: Payer: Self-pay | Admitting: Family Medicine

## 2023-01-12 DIAGNOSIS — E7800 Pure hypercholesterolemia, unspecified: Secondary | ICD-10-CM

## 2023-01-12 LAB — LIPID PANEL
Chol/HDL Ratio: 2.7 ratio (ref 0.0–5.0)
Cholesterol, Total: 120 mg/dL (ref 100–199)
HDL: 45 mg/dL (ref >39)
LDL Chol Calc (NIH): 59 mg/dL (ref 0–99)
Triglycerides: 77 mg/dL (ref 0–149)
VLDL Cholesterol Cal: 16 mg/dL (ref 5–40)

## 2023-01-12 LAB — COMPREHENSIVE METABOLIC PANEL
ALT: 31 IU/L (ref 0–44)
AST: 22 IU/L (ref 0–40)
Albumin/Globulin Ratio: 1.7 (ref 1.2–2.2)
Albumin: 4.2 g/dL (ref 3.9–4.9)
Alkaline Phosphatase: 58 IU/L (ref 44–121)
BUN/Creatinine Ratio: 15 (ref 10–24)
BUN: 13 mg/dL (ref 8–27)
Bilirubin Total: 0.9 mg/dL (ref 0.0–1.2)
CO2: 21 mmol/L (ref 20–29)
Calcium: 9.3 mg/dL (ref 8.6–10.2)
Chloride: 104 mmol/L (ref 96–106)
Creatinine, Ser: 0.85 mg/dL (ref 0.76–1.27)
Globulin, Total: 2.5 g/dL (ref 1.5–4.5)
Glucose: 98 mg/dL (ref 70–99)
Potassium: 4.9 mmol/L (ref 3.5–5.2)
Sodium: 141 mmol/L (ref 134–144)
Total Protein: 6.7 g/dL (ref 6.0–8.5)
eGFR: 96 mL/min/{1.73_m2} (ref >59)

## 2023-01-12 MED ORDER — ATORVASTATIN CALCIUM 80 MG PO TABS: 80.0000 mg | ORAL_TABLET | Freq: Every evening | ORAL | 1 refills | Status: DC

## 2023-02-01 ENCOUNTER — Telehealth: Payer: Self-pay | Admitting: Cardiovascular Disease

## 2023-02-01 ENCOUNTER — Encounter: Payer: Self-pay | Admitting: Cardiovascular Disease

## 2023-02-01 ENCOUNTER — Ambulatory Visit: Payer: Managed Care, Other (non HMO) | Admitting: Cardiovascular Disease

## 2023-02-01 VITALS — BP 118/86 | HR 96 | Ht 69.0 in | Wt 200.0 lb

## 2023-02-01 DIAGNOSIS — I249 Acute ischemic heart disease, unspecified: Secondary | ICD-10-CM | POA: Diagnosis not present

## 2023-02-01 DIAGNOSIS — I2510 Atherosclerotic heart disease of native coronary artery without angina pectoris: Secondary | ICD-10-CM

## 2023-02-01 DIAGNOSIS — I455 Other specified heart block: Secondary | ICD-10-CM

## 2023-02-01 DIAGNOSIS — E7800 Pure hypercholesterolemia, unspecified: Secondary | ICD-10-CM

## 2023-02-01 DIAGNOSIS — I10 Essential (primary) hypertension: Secondary | ICD-10-CM

## 2023-02-01 MED ORDER — TICAGRELOR 60 MG PO TABS: 60.0000 mg | ORAL_TABLET | Freq: Two times a day (BID) | ORAL | 2 refills | Status: DC

## 2023-02-01 MED ORDER — METOPROLOL SUCCINATE 12.5 MG HALF TABLET: 25.0000 mg | ORAL_TABLET | Freq: Every day | ORAL | Status: DC

## 2023-02-01 NOTE — Progress Notes (Signed)
Cardiology Office Note   Date:  02/01/2023   ID:  [name], DOB 22-Sep-1955, MRN [id number]  PCP:  Richard Ramp, MD  Cardiologist:  Richard Blackwater, MD      History of Present Illness: Richard Warren is a 67 y.o. male who presents for  Chief Complaint  Patient presents with   Follow-up    3 month follow up    Doing well, no chest pain or SOB.      Past Medical History:  Diagnosis Date   Colon polyp    GERD (gastroesophageal reflux disease)    Hyperlipidemia    Kidney stone    Personal history of kidney stones 04/18/2018     Past Surgical History:  Procedure Laterality Date   APPENDECTOMY  1968?   COLONOSCOPY  06-28-09   Dr Lemar Livings   CORONARY STENT INTERVENTION N/A 03/03/2022   Procedure: CORONARY STENT INTERVENTION;  Surgeon: Richard Kendall, MD;  Location: [facility];  Service: Cardiovascular;  Laterality: N/A;   EXTRACORPOREAL SHOCK WAVE LITHOTRIPSY Left 01/07/2017   Procedure: EXTRACORPOREAL SHOCK WAVE LITHOTRIPSY (ESWL);  Surgeon: Richard Laser, MD;  Location: [facility];  Service: Urology;  Laterality: Left;   LEFT HEART CATH AND CORONARY ANGIOGRAPHY N/A 03/03/2022   Procedure: LEFT HEART CATH AND CORONARY ANGIOGRAPHY;  Surgeon: Richard Nancy, MD;  Location: [facility];  Service: Cardiovascular;  Laterality: N/A;   UPPER GI ENDOSCOPY  06-28-09   Dr Lemar Livings   VASECTOMY       Current Outpatient Medications  Medication Sig Dispense Refill   acidophilus (RISAQUAD) CAPS capsule Take 1 capsule by mouth daily.     aspirin EC 81 MG tablet Take 1 tablet (81 mg total) by mouth daily. Swallow whole. 30 tablet 12   atorvastatin (LIPITOR) 80 MG tablet Take 1 tablet (80 mg total) by mouth every evening. 90 tablet 1   Coenzyme Q10 (CO Q 10 PO) Take by mouth.     Magnesium 250 MG TABS Take by mouth.     Omega-3 Fatty Acids (OMEGA 3 PO) Take 3,000 mg by mouth 2 (two) times daily.     Milk Thistle 250 MG CAPS Take by mouth.  (Patient not taking: Reported on 02/01/2023)     ticagrelor (BRILINTA) 60 MG TABS tablet Take 1 tablet (60 mg total) by mouth 2 (two) times daily. 30 tablet 2   vitamin E 400 UNIT capsule Take 400 Units by mouth daily. (Patient not taking: Reported on 02/01/2023)     Current Facility-Administered Medications  Medication Dose Route Frequency Provider Last Rate Last Admin   metoprolol succinate (TOPROL-XL) 24 hr tablet 25 mg  25 mg Oral Daily Richard Nancy, MD        Allergies:   Zolpidem    Social History:   reports that he has never smoked. He has never used smokeless tobacco. He reports current alcohol use of about 53.0 standard drinks of alcohol per week. He reports that he does not use drugs.   Family History:  family history includes Coronary artery disease in his sister; Dementia in his mother; Heart attack in his sister; Hypertension in his brother.    ROS:     Review of Systems  Constitutional: Negative.   HENT: Negative.    Eyes: Negative.   Respiratory: Negative.    Gastrointestinal: Negative.   Genitourinary: Negative.   Musculoskeletal: Negative.   Skin: Negative.   Neurological: Negative.   Endo/Heme/Allergies: Negative.  Psychiatric/Behavioral: Negative.    All other systems reviewed and are negative.     All other systems are reviewed and negative.    PHYSICAL EXAM: VS:  BP 118/86   Pulse 96   Ht 5\' 9"  (1.753 m)   Wt 200 lb (90.7 kg)   SpO2 93%   BMI 29.53 kg/m  , BMI Body mass index is 29.53 kg/m. Last weight:  Wt Readings from Last 3 Encounters:  02/01/23 200 lb (90.7 kg)  01/11/23 201 lb (91.2 kg)  11/02/22 201 lb 6.4 oz (91.4 kg)     Physical Exam Vitals reviewed.  Constitutional:      Appearance: Normal appearance. He is normal weight.  HENT:     Head: Normocephalic.     Nose: Nose normal.     Mouth/Throat:     Mouth: Mucous membranes are moist.  Eyes:     Pupils: Pupils are equal, round, and reactive to light.  Cardiovascular:      Rate and Rhythm: Normal rate and regular rhythm.     Pulses: Normal pulses.     Heart sounds: Normal heart sounds.  Pulmonary:     Effort: Pulmonary effort is normal.  Abdominal:     General: Abdomen is flat. Bowel sounds are normal.  Musculoskeletal:        General: Normal range of motion.     Cervical back: Normal range of motion.  Skin:    General: Skin is warm.  Neurological:     General: No focal deficit present.     Mental Status: He is alert.  Psychiatric:        Mood and Affect: Mood normal.       EKG:   Recent Labs: 03/03/2022: Magnesium 2.0 05/20/2022: Hemoglobin 15.6; Platelets 222 01/11/2023: ALT 31; BUN 13; Creatinine, Ser 0.85; Potassium 4.9; Sodium 141    Lipid Panel    Component Value Date/Time   CHOL 120 01/11/2023 1142   TRIG 77 01/11/2023 1142   HDL 45 01/11/2023 1142   CHOLHDL 2.7 01/11/2023 1142   CHOLHDL 4.9 03/04/2022 0512   VLDL 25 03/04/2022 0512   LDLCALC 59 01/11/2023 1142   LDLCALC 110 (H) 05/14/2017 1556      Other studies Reviewed: Additional studies/ records that were reviewed today include:  Review of the above records demonstrates:       No data to display            ASSESSMENT AND PLAN:    ICD-10-CM   1. Coronary artery disease involving native coronary artery of native 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 angina pectoris  I25.10 metoprolol succinate (TOPROL-XL) 24 hr tablet 25 mg   6/23 had NSTEMI and stenting of LCX, has no further chest pain but bruises, so decrease brillanta 60 bid    2. NSTEMI  I24.9 metoprolol succinate (TOPROL-XL) 24 hr tablet 25 mg    3. Sinus arrest  I45.5 metoprolol succinate (TOPROL-XL) 24 hr tablet 25 mg    4. Hypercholesteremia  E78.00 metoprolol succinate (TOPROL-XL) 24 hr tablet 25 mg    5. Primary hypertension  I10 metoprolol succinate (TOPROL-XL) 24 hr tablet 25 mg       Problem List Items Addressed This Visit       Cardiovascular and Mediastinum   NSTEMI   Relevant Medications   metoprolol  succinate (TOPROL-XL) 24 hr tablet 25 mg (Start on 02/01/2023 11:15 AM)   Sinus arrest   Relevant Medications   metoprolol succinate (TOPROL-XL) 24 hr tablet 25 mg (Start  on 02/01/2023 11:15 AM)     Other   Hypercholesteremia   Relevant Medications   metoprolol succinate (TOPROL-XL) 24 hr tablet 25 mg (Start on 02/01/2023 11:15 AM)   Other Visit Diagnoses     Coronary artery disease involving native coronary artery of native 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 angina pectoris    -  Primary   6/23 had NSTEMI and stenting of LCX, has no further chest pain but bruises, so decrease brillanta 60 bid   Relevant Medications   metoprolol succinate (TOPROL-XL) 24 hr tablet 25 mg (Start on 02/01/2023 11:15 AM)   Primary hypertension       Relevant Medications   metoprolol succinate (TOPROL-XL) 24 hr tablet 25 mg (Start on 02/01/2023 11:15 AM)          Disposition:   Return in about 3 months (around 05/04/2023).    Total time spent: 30 minutes  Signed,  Richard Blackwater, MD  02/01/2023 11:05 AM    [facility]

## 2023-02-01 NOTE — Telephone Encounter (Signed)
Mandi from [facility] called regarding patient's rx for Brilinta 60 mg. Needs to be sent for #60 as they cannot break a bottle open. Please send.

## 2023-02-03 ENCOUNTER — Other Ambulatory Visit: Payer: Self-pay

## 2023-02-03 MED ORDER — METOPROLOL SUCCINATE ER 25 MG PO TB24: 25.0000 mg | ORAL_TABLET | Freq: Every day | ORAL | 2 refills | Status: DC

## 2023-02-13 ENCOUNTER — Other Ambulatory Visit: Payer: Self-pay

## 2023-02-13 ENCOUNTER — Emergency Department
Admission: EM | Admit: 2023-02-13 | Discharge: 2023-02-13 | Disposition: A | Discharge status: Home / Self Care | Payer: Managed Care, Other (non HMO) | Attending: Emergency Medicine | Admitting: Emergency Medicine

## 2023-02-13 DIAGNOSIS — W268XXA Contact with other sharp object(s), not elsewhere classified, initial encounter: Secondary | ICD-10-CM | POA: Insufficient documentation

## 2023-02-13 DIAGNOSIS — I2510 Atherosclerotic heart disease of native coronary artery without angina pectoris: Secondary | ICD-10-CM | POA: Insufficient documentation

## 2023-02-13 DIAGNOSIS — Y93E8 Activity, other personal hygiene: Secondary | ICD-10-CM | POA: Insufficient documentation

## 2023-02-13 DIAGNOSIS — S0181XA Laceration without foreign body of other part of head, initial encounter: Secondary | ICD-10-CM | POA: Insufficient documentation

## 2023-02-13 DIAGNOSIS — Z7982 Long term (current) use of aspirin: Secondary | ICD-10-CM | POA: Insufficient documentation

## 2023-02-13 MED ORDER — SILVER NITRATE-POT NITRATE 75-25 % EX MISC
1.0000 | Freq: Once | CUTANEOUS | Status: AC
  Administered 2023-02-13: 1 via TOPICAL
  Filled 2023-02-13: qty 10

## 2023-02-13 MED ORDER — LIDOCAINE-EPINEPHRINE-TETRACAINE (LET) TOPICAL GEL
3.0000 mL | Freq: Once | TOPICAL | Status: AC
  Administered 2023-02-13: 3 mL via TOPICAL
  Filled 2023-02-13: qty 3

## 2023-02-13 NOTE — Discharge Instructions (Signed)
Please avoid touching or rubbing the area so that it does not start bleeding again.  Please return for any new, worsening, or change in symptoms or other concerns.  It was a pleasure caring for you today.

## 2023-02-13 NOTE — ED Triage Notes (Signed)
Pt to ER with c/o bleeding from his upper lip after cutting himself shaving.  Pt takes blood thinners and states he has been bleeding for at least 2  hours.  Small area of bleeding noted under right nare.

## 2023-02-13 NOTE — ED Provider Notes (Signed)
[facility] Provider Note    Event Date/Time   First MD Initiated Contact with Patient 02/13/23 1027     (approximate)   History   Laceration   HPI  Richard Warren is a 67 y.o. male with a past medical history of coronary artery disease on aspirin and Brilinta who presents today for evaluation of laceration just beneath his right nare from cutting himself shaving this morning.  He reports that he has not been able to get it to stop bleeding.  No pain.  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 --      Excl. in GC? --     Most recent vital signs: Vitals:   02/13/23 1022  BP: (!) 160/94  Pulse: 67  Resp: 18  Temp: 98.2 F (36.8 C)  SpO2: 96%    Physical Exam Vitals and  nursing note reviewed.  Constitutional:      General: Awake and alert. No acute distress.    Appearance: Normal appearance. The patient is normal weight.  HENT:     Head: Normocephalic and atraumatic.     Mouth: Mucous membranes are moist.  1 to 2 mm abrasion just beneath right nare, oozing bright red blood. Eyes:     General: PERRL. Normal EOMs        Right eye: No discharge.        Left eye: No discharge.     Conjunctiva/sclera: Conjunctivae normal.  Cardiovascular:     Rate and Rhythm: Normal rate and regular rhythm.     Pulses: Normal pulses.  Pulmonary:     Effort: Pulmonary effort is normal. No respiratory distress.     Breath sounds: Normal breath sounds.  Abdominal:     Abdomen is soft. There is no abdominal tenderness. No rebound or guarding. No distention. Musculoskeletal:        General: No swelling. Normal range of motion.     Cervical back: Normal range of motion and neck supple.  Skin:    General: Skin is warm and dry.     Capillary Refill: Capillary refill takes less than 2 seconds.     Findings: No rash.  Neurological:     Mental Status: The patient is awake and alert.      ED Results / Procedures / Treatments   Labs (all labs ordered are listed, but only abnormal results are displayed) Labs Reviewed - No data to display   EKG     RADIOLOGY     PROCEDURES:  Critical Care performed:   Cauterization  Date/Time: 02/13/2023 11:35 AM  Performed by: Jackelyn Hoehn, PA-C Authorized by: Jackelyn Hoehn, PA-C  Consent: Verbal consent obtained. Risks and benefits: risks, benefits and alternatives were discussed Consent given by: patient Patient understanding: patient states understanding of the procedure being performed Patient consent: the patient's understanding of the procedure matches consent given Procedure consent: procedure consent matches procedure scheduled Relevant documents: relevant documents present and verified Test results: test  results available and properly labeled Site marked: the operative site was marked Imaging studies: imaging studies not available Required items: required blood products, implants, devices, and special equipment available Patient identity confirmed: verbally with patient Time out: Immediately prior to procedure a "time out" was called to verify the correct patient, procedure, equipment, support staff and site/side marked as required. Preparation: Patient was prepped and draped in the usual sterile fashion. Local anesthesia used: yes  Anesthesia: Local anesthesia used: yes Local Anesthetic: LET (lido, epi, tetracaine)  Sedation: Patient sedated: no  Patient tolerance: patient tolerated the procedure well with no immediate complications Comments: 2 silver nitrate sticks used      MEDICATIONS ORDERED IN ED: Medications  silver nitrate applicators applicator 1 Stick (1 Stick Topical Given 02/13/23 1130)  lidocaine-EPINEPHrine-tetracaine (LET) topical gel (3 mLs Topical Given 02/13/23 1036)     IMPRESSION / MDM / ASSESSMENT AND PLAN / ED COURSE  I reviewed the triage vital signs and the nursing notes.   Differential diagnosis includes, but is not limited to, abrasion, laceration, epistaxis.  Patient is awake and alert, hemodynamically stable and afebrile.  He has a small slice in his skin just needs his right ear that is  oozing bright red blood.  Patient is on Brilinta and aspirin.  Let was applied with hemostasis, and subsequently silver nitrate applicator was used to maintain hemostasis.  Patient was then monitored in the emergency department for 30 minutes postprocedure to ensure that the area did not start bleeding again.  Patient was advised to avoid touching or rubbing this area.  We discussed return precautions and outpatient follow-up.  Patient understands and agrees with plan.  He was discharged in stable condition.   Patient's presentation is most consistent with acute  complicated illness / injury requiring diagnostic workup.      FINAL CLINICAL IMPRESSION(S) / ED DIAGNOSES   Final diagnoses:  Facial laceration, initial encounter     Rx / DC Orders   ED Discharge Orders     None        Note:  This document was prepared using Dragon voice recognition software and may include unintentional dictation errors.   Keturah Shavers 02/13/23 1158    Concha Se, MD 02/13/23 [phone]

## 2023-04-06 ENCOUNTER — Other Ambulatory Visit: Payer: Self-pay | Admitting: Cardiovascular Disease

## 2023-05-03 ENCOUNTER — Ambulatory Visit: Payer: Managed Care, Other (non HMO) | Admitting: Cardiovascular Disease

## 2023-05-03 ENCOUNTER — Encounter: Payer: Self-pay | Admitting: Cardiovascular Disease

## 2023-05-03 VITALS — BP 132/72 | Ht 68.0 in | Wt 200.4 lb

## 2023-05-03 DIAGNOSIS — R0789 Other chest pain: Secondary | ICD-10-CM

## 2023-05-03 DIAGNOSIS — E7800 Pure hypercholesterolemia, unspecified: Secondary | ICD-10-CM

## 2023-05-03 DIAGNOSIS — I2510 Atherosclerotic heart disease of native coronary artery without angina pectoris: Secondary | ICD-10-CM

## 2023-05-03 DIAGNOSIS — Z9861 Coronary angioplasty status: Secondary | ICD-10-CM

## 2023-05-03 DIAGNOSIS — I10 Essential (primary) hypertension: Secondary | ICD-10-CM | POA: Diagnosis not present

## 2023-05-03 MED ORDER — ATORVASTATIN CALCIUM 80 MG PO TABS: 80.0000 mg | ORAL_TABLET | Freq: Every evening | ORAL | 1 refills | Status: DC

## 2023-05-03 MED ORDER — METOPROLOL SUCCINATE ER 25 MG PO TB24: 25.0000 mg | ORAL_TABLET | Freq: Every day | ORAL | 2 refills | Status: DC

## 2023-05-03 MED ORDER — TICAGRELOR 60 MG PO TABS: 60.0000 mg | ORAL_TABLET | Freq: Two times a day (BID) | ORAL | 2 refills | Status: DC

## 2023-05-03 NOTE — Progress Notes (Signed)
Cardiology Office Note   Date:  05/03/2023   ID:  Richard Warren, DOB [birth year], MRN [id number]  PCP:  Ronnald Ramp, MD  Cardiologist:  Adrian Blackwater, MD      History of Present Illness: Richard Warren is a 67 y.o. male who presents for  Chief Complaint  Patient presents with   Follow-up    Chest Pain  This is a new problem. The current episode started 1 to 4 weeks ago. The onset quality is sudden. The problem has been resolved.      Past Medical History:  Diagnosis Date   Colon polyp    GERD (gastroesophageal reflux disease)    Hyperlipidemia    Kidney stone    Personal history of kidney stones 04/18/2018     Past Surgical History:  Procedure Laterality Date   APPENDECTOMY  1968?   COLONOSCOPY  06-28-09   Dr Lemar Livings   CORONARY STENT INTERVENTION N/A 03/03/2022   Procedure: CORONARY STENT INTERVENTION;  Surgeon: Yvonne Kendall, MD;  Location: [facility];  Service: Cardiovascular;  Laterality: N/A;   EXTRACORPOREAL SHOCK WAVE LITHOTRIPSY Left 01/07/2017   Procedure: EXTRACORPOREAL SHOCK WAVE LITHOTRIPSY (ESWL);  Surgeon: Hildred Laser, MD;  Location: [facility];  Service: Urology;  Laterality: Left;   LEFT HEART CATH AND CORONARY ANGIOGRAPHY N/A 03/03/2022   Procedure: LEFT HEART CATH AND CORONARY ANGIOGRAPHY;  Surgeon: Laurier Nancy, MD;  Location: [facility];  Service: Cardiovascular;  Laterality: N/A;   UPPER GI ENDOSCOPY  06-28-09   Dr Lemar Livings   VASECTOMY       Current Outpatient Medications  Medication Sig Dispense Refill   acidophilus (RISAQUAD) CAPS capsule Take 1 capsule by mouth daily.     aspirin EC 81 MG tablet Take 1 tablet (81 mg total) by mouth daily. Swallow whole. 30 tablet 12   atorvastatin (LIPITOR) 80 MG tablet Take 1 tablet (80 mg total) by mouth every evening. 90 tablet 1   Coenzyme Q10 (CO Q 10 PO) Take by mouth.     Magnesium 250 MG TABS Take by mouth.     metoprolol succinate (TOPROL-XL) 25  MG 24 hr tablet Take 1 tablet (25 mg total) by mouth daily. 30 tablet 2   Omega-3 Fatty Acids (OMEGA 3 PO) Take 3,000 mg by mouth 2 (two) times daily.     ticagrelor (BRILINTA) 60 MG TABS tablet Take 1 tablet (60 mg total) by mouth 2 (two) times daily. 30 tablet 2   ticagrelor (BRILINTA) 60 MG TABS tablet Take 1 tablet (60 mg total) by mouth 2 (two) times daily. 60 tablet 2   vitamin E 400 UNIT capsule Take 400 Units by mouth daily. (Patient not taking: Reported on 02/01/2023)     No current facility-administered medications for this visit.    Allergies:   Zolpidem    Social History:   reports that he has never smoked. He has never used smokeless tobacco. He reports current alcohol use of about 53.0 standard drinks of alcohol per week. He reports that he does not use drugs.   Family History:  family history includes Coronary artery disease in his sister; Dementia in his mother; Heart attack in his sister; Hypertension in his brother.    ROS:     Review of Systems  Constitutional: Negative.   HENT: Negative.    Eyes: Negative.   Respiratory: Negative.    Cardiovascular:  Positive for chest pain.  Gastrointestinal: Negative.   Genitourinary: Negative.  Musculoskeletal: Negative.   Skin: Negative.   Neurological: Negative.   Endo/Heme/Allergies: Negative.   Psychiatric/Behavioral: Negative.    All other systems reviewed and are negative.     All other systems are reviewed and negative.    PHYSICAL EXAM: VS:  BP 132/72   Ht 5\' 8"  (1.727 m)   Wt 200 lb 6.4 oz (90.9 kg)   BMI 30.47 kg/m  , BMI Body mass index is 30.47 kg/m. Last weight:  Wt Readings from Last 3 Encounters:  05/03/23 200 lb 6.4 oz (90.9 kg)  02/13/23 200 lb (90.7 kg)  02/01/23 200 lb (90.7 kg)     Physical Exam Vitals reviewed.  Constitutional:      Appearance: Normal appearance. He is normal weight.  HENT:     Head: Normocephalic.     Nose: Nose normal.     Mouth/Throat:     Mouth: Mucous  membranes are moist.  Eyes:     Pupils: Pupils are equal, round, and reactive to light.  Cardiovascular:     Rate and Rhythm: Normal rate and regular rhythm.     Pulses: Normal pulses.     Heart sounds: Normal heart sounds.  Pulmonary:     Effort: Pulmonary effort is normal.  Abdominal:     General: Abdomen is flat. Bowel sounds are normal.  Musculoskeletal:        General: Normal range of motion.     Cervical back: Normal range of motion.  Skin:    General: Skin is warm.  Neurological:     General: No focal deficit present.     Mental Status: He is alert.  Psychiatric:        Mood and Affect: Mood normal.       EKG:   Recent Labs: 05/20/2022: Hemoglobin 15.6; Platelets 222 01/11/2023: ALT 31; BUN 13; Creatinine, Ser 0.85; Potassium 4.9; Sodium 141    Lipid Panel    Component Value Date/Time   CHOL 120 01/11/2023 1142   TRIG 77 01/11/2023 1142   HDL 45 01/11/2023 1142   CHOLHDL 2.7 01/11/2023 1142   CHOLHDL 4.9 03/04/2022 0512   VLDL 25 03/04/2022 0512   LDLCALC 59 01/11/2023 1142   LDLCALC 110 (H) 05/14/2017 1556      Other studies Reviewed: Additional studies/ records that were reviewed today include:  Review of the above records demonstrates:       No data to display            ASSESSMENT AND PLAN:    ICD-10-CM   1. Hypercholesteremia  E78.00 MYOCARDIAL PERFUSION IMAGING    atorvastatin (LIPITOR) 80 MG tablet    2. Primary hypertension  I10 MYOCARDIAL PERFUSION IMAGING    3. 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 CAD S/P percutaneous coronary angioplasty  I25.10 MYOCARDIAL PERFUSION IMAGING   Z98.61     5. Other chest pain  R07.89 MYOCARDIAL PERFUSION IMAGING   had stent in D1, LCX 6/24, still has chest pain       Problem List Items Addressed This Visit       Other   Hypercholesteremia - Primary   Relevant Medications   metoprolol  succinate (TOPROL-XL) 25 MG 24 hr tablet   atorvastatin (LIPITOR) 80 MG tablet   Other Relevant Orders   MYOCARDIAL PERFUSION IMAGING   Other Visit Diagnoses     Primary hypertension  Relevant Medications   metoprolol succinate (TOPROL-XL) 25 MG 24 hr tablet   atorvastatin (LIPITOR) 80 MG tablet   Other Relevant Orders   MYOCARDIAL PERFUSION IMAGING   Coronary artery disease involving native coronary artery of native 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 angina pectoris       Relevant Medications   metoprolol succinate (TOPROL-XL) 25 MG 24 hr tablet   atorvastatin (LIPITOR) 80 MG tablet   Other Relevant Orders   MYOCARDIAL PERFUSION IMAGING   CAD S/P percutaneous coronary angioplasty       Relevant Medications   metoprolol succinate (TOPROL-XL) 25 MG 24 hr tablet   atorvastatin (LIPITOR) 80 MG tablet   Other Relevant Orders   MYOCARDIAL PERFUSION IMAGING   Other chest pain       had stent in D1, LCX 6/24, still has chest pain   Relevant Orders   MYOCARDIAL PERFUSION IMAGING          Disposition:   Return in about 4 weeks (around 05/31/2023) for stress test and f/u.    Total time spent: 30 minutes  Signed,  Adrian Blackwater, MD  05/03/2023 10:08 AM    [facility]

## 2023-05-17 ENCOUNTER — Ambulatory Visit: Payer: Managed Care, Other (non HMO) | Admitting: Family Medicine

## 2023-05-17 ENCOUNTER — Ambulatory Visit (INDEPENDENT_AMBULATORY_CARE_PROVIDER_SITE_OTHER): Payer: Managed Care, Other (non HMO)

## 2023-05-17 DIAGNOSIS — I2510 Atherosclerotic heart disease of native coronary artery without angina pectoris: Secondary | ICD-10-CM

## 2023-05-17 DIAGNOSIS — E7800 Pure hypercholesterolemia, unspecified: Secondary | ICD-10-CM

## 2023-05-17 DIAGNOSIS — I10 Essential (primary) hypertension: Secondary | ICD-10-CM

## 2023-05-17 DIAGNOSIS — Z9861 Coronary angioplasty status: Secondary | ICD-10-CM

## 2023-05-17 DIAGNOSIS — R0789 Other chest pain: Secondary | ICD-10-CM

## 2023-05-17 MED ORDER — TECHNETIUM TC 99M SESTAMIBI GENERIC - CARDIOLITE
10.5000 | Freq: Once | INTRAVENOUS | Status: AC | PRN
  Administered 2023-05-17: 10.5 via INTRAVENOUS

## 2023-05-17 MED ORDER — TECHNETIUM TC 99M SESTAMIBI GENERIC - CARDIOLITE
31.7000 | Freq: Once | INTRAVENOUS | Status: AC | PRN
  Administered 2023-05-17: 31.7 via INTRAVENOUS

## 2023-05-24 ENCOUNTER — Ambulatory Visit: Payer: Managed Care, Other (non HMO) | Admitting: Family Medicine

## 2023-05-31 ENCOUNTER — Encounter: Payer: Self-pay | Admitting: Cardiovascular Disease

## 2023-05-31 ENCOUNTER — Ambulatory Visit: Payer: Managed Care, Other (non HMO) | Admitting: Cardiovascular Disease

## 2023-05-31 VITALS — BP 140/80 | HR 61 | Ht 68.0 in | Wt 200.8 lb

## 2023-05-31 DIAGNOSIS — I455 Other specified heart block: Secondary | ICD-10-CM

## 2023-05-31 DIAGNOSIS — E7800 Pure hypercholesterolemia, unspecified: Secondary | ICD-10-CM

## 2023-05-31 DIAGNOSIS — I2510 Atherosclerotic heart disease of native coronary artery without angina pectoris: Secondary | ICD-10-CM

## 2023-05-31 DIAGNOSIS — Z9861 Coronary angioplasty status: Secondary | ICD-10-CM

## 2023-05-31 DIAGNOSIS — I249 Acute ischemic heart disease, unspecified: Secondary | ICD-10-CM

## 2023-05-31 DIAGNOSIS — R0789 Other chest pain: Secondary | ICD-10-CM

## 2023-05-31 DIAGNOSIS — I10 Essential (primary) hypertension: Secondary | ICD-10-CM

## 2023-05-31 NOTE — Progress Notes (Signed)
Cardiology Office Note   Date:  05/31/2023   ID:  Richard Warren, DOB June 13, 1956, MRN [id number]  PCP:  Ronnald Ramp, MD  Cardiologist:  Richard Blackwater, MD      History of Present Illness: Richard Warren is a 67 y.o. male who presents for  Chief Complaint  Patient presents with   Follow-up    4 week with NST results    BP high, says had cofee      Past Medical History:  Diagnosis Date   Colon polyp    GERD (gastroesophageal reflux disease)    Hyperlipidemia    Kidney stone    Personal history of kidney stones 04/18/2018     Past Surgical History:  Procedure Laterality Date   APPENDECTOMY  1968?   COLONOSCOPY  06-28-09   Dr Lemar Livings   CORONARY STENT INTERVENTION N/A 03/03/2022   Procedure: CORONARY STENT INTERVENTION;  Surgeon: Yvonne Kendall, MD;  Location: [facility];  Service: Cardiovascular;  Laterality: N/A;   EXTRACORPOREAL SHOCK WAVE LITHOTRIPSY Left 01/07/2017   Procedure: EXTRACORPOREAL SHOCK WAVE LITHOTRIPSY (ESWL);  Surgeon: Hildred Laser, MD;  Location: [facility];  Service: Urology;  Laterality: Left;   LEFT HEART CATH AND CORONARY ANGIOGRAPHY N/A 03/03/2022   Procedure: LEFT HEART CATH AND CORONARY ANGIOGRAPHY;  Surgeon: Laurier Nancy, MD;  Location: [facility];  Service: Cardiovascular;  Laterality: N/A;   UPPER GI ENDOSCOPY  06-28-09   Dr Lemar Livings   VASECTOMY       Current Outpatient Medications  Medication Sig Dispense Refill   acidophilus (RISAQUAD) CAPS capsule Take 1 capsule by mouth daily.     aspirin EC 81 MG tablet Take 1 tablet (81 mg total) by mouth daily. Swallow whole. 30 tablet 12   atorvastatin (LIPITOR) 80 MG tablet Take 1 tablet (80 mg total) by mouth every evening. 90 tablet 1   Coenzyme Q10 (CO Q 10 PO) Take by mouth.     Magnesium 250 MG TABS Take by mouth.     metoprolol succinate (TOPROL-XL) 25 MG 24 hr tablet Take 1 tablet (25 mg total) by mouth daily. 30 tablet 2   Omega-3 Fatty  Acids (OMEGA 3 PO) Take 3,000 mg by mouth 2 (two) times daily.     ticagrelor (BRILINTA) 60 MG TABS tablet Take 1 tablet (60 mg total) by mouth 2 (two) times daily. 30 tablet 2   ticagrelor (BRILINTA) 60 MG TABS tablet Take 1 tablet (60 mg total) by mouth 2 (two) times daily. 60 tablet 2   vitamin E 400 UNIT capsule Take 400 Units by mouth daily. (Patient not taking: Reported on 02/01/2023)     No current facility-administered medications for this visit.    Allergies:   Zolpidem    Social History:   reports that he has never smoked. He has never used smokeless tobacco. He reports current alcohol use of about 53.0 standard drinks of alcohol per week. He reports that he does not use drugs.   Family History:  family history includes Coronary artery disease in his sister; Dementia in his mother; Heart attack in his sister; Hypertension in his brother.    ROS:     Review of Systems  Constitutional: Negative.   HENT: Negative.    Eyes: Negative.   Respiratory: Negative.    Gastrointestinal: Negative.   Genitourinary: Negative.   Musculoskeletal: Negative.   Skin: Negative.   Neurological: Negative.   Endo/Heme/Allergies: Negative.   Psychiatric/Behavioral: Negative.  All other systems reviewed and are negative.     All other systems are reviewed and negative.    PHYSICAL EXAM: VS:  BP (!) 140/80   Pulse 61   Ht 5\' 8"  (1.727 m)   Wt 200 lb 12.8 oz (91.1 kg)   SpO2 98%   BMI 30.53 kg/m  , BMI Body mass index is 30.53 kg/m. Last weight:  Wt Readings from Last 3 Encounters:  05/31/23 200 lb 12.8 oz (91.1 kg)  05/03/23 200 lb 6.4 oz (90.9 kg)  02/13/23 200 lb (90.7 kg)     Physical Exam Vitals reviewed.  Constitutional:      Appearance: Normal appearance. He is normal weight.  HENT:     Head: Normocephalic.     Nose: Nose normal.     Mouth/Throat:     Mouth: Mucous membranes are moist.  Eyes:     Pupils: Pupils are equal, round, and reactive to light.   Cardiovascular:     Rate and Rhythm: Normal rate and regular rhythm.     Pulses: Normal pulses.     Heart sounds: Normal heart sounds.  Pulmonary:     Effort: Pulmonary effort is normal.  Abdominal:     General: Abdomen is flat. Bowel sounds are normal.  Musculoskeletal:        General: Normal range of motion.     Cervical back: Normal range of motion.  Skin:    General: Skin is warm.  Neurological:     General: No focal deficit present.     Mental Status: He is alert.  Psychiatric:        Mood and Affect: Mood normal.       EKG:   Recent Labs: 01/11/2023: ALT 31; BUN 13; Creatinine, Ser 0.85; Potassium 4.9; Sodium 141    Lipid Panel    Component Value Date/Time   CHOL 120 01/11/2023 1142   TRIG 77 01/11/2023 1142   HDL 45 01/11/2023 1142   CHOLHDL 2.7 01/11/2023 1142   CHOLHDL 4.9 03/04/2022 0512   VLDL 25 03/04/2022 0512   LDLCALC 59 01/11/2023 1142   LDLCALC 110 (H) 05/14/2017 1556      Other studies Reviewed: Additional studies/ records that were reviewed today include:  Review of the above records demonstrates:       No data to display            ASSESSMENT AND PLAN:    ICD-10-CM   1. Sinus arrest  I45.5     2. NSTEMI  I24.9     3. Hypercholesteremia  E78.00     4. Primary hypertension  I10    repeat 120/70    5. CAD S/P percutaneous coronary angioplasty  I25.10    Z98.61     6. Other chest pain  R07.89    Discordant findings on stress test, feels fine       Problem List Items Addressed This Visit       Cardiovascular and Mediastinum   NSTEMI   Sinus arrest - Primary     Other   Hypercholesteremia   Other Visit Diagnoses     Primary hypertension       repeat 120/70   CAD S/P percutaneous coronary angioplasty       Other chest pain       Discordant findings on stress test, feels fine          Disposition:   Return in about 2 months (around 07/31/2023).  Total time spent: 30 minutes  Signed,  Richard Blackwater,  MD  05/31/2023 11:18 AM    [facility]

## 2023-06-14 ENCOUNTER — Encounter: Payer: Self-pay | Admitting: Family Medicine

## 2023-06-14 ENCOUNTER — Ambulatory Visit: Payer: Managed Care, Other (non HMO) | Admitting: Family Medicine

## 2023-06-14 VITALS — BP 134/80 | HR 64 | Temp 98.4°F | Ht 68.0 in | Wt 203.0 lb

## 2023-06-14 DIAGNOSIS — R030 Elevated blood-pressure reading, without diagnosis of hypertension: Secondary | ICD-10-CM | POA: Diagnosis not present

## 2023-06-14 DIAGNOSIS — E782 Mixed hyperlipidemia: Secondary | ICD-10-CM | POA: Diagnosis not present

## 2023-06-14 NOTE — Progress Notes (Signed)
Established patient visit   Patient: Richard Warren   DOB: 1956/06/03   67 y.o. Male  MRN: [id number] Visit Date: 06/14/2023  Today's healthcare provider: Ronnald Ramp, MD   Chief Complaint  Patient presents with   Hypertension    Patient was last seen for hypertension in May.  He does not check his blood pressure at home.   Hyperlipidemia   Subjective     HPI     Hypertension    Additional comments: Patient was last seen for hypertension in May.  He does not check his blood pressure at home.      Last edited by Adline Peals, CMA on 06/14/2023 10:15 AM.       Discussed the use of AI scribe software for clinical note transcription with the patient, who gave verbal consent to proceed.  History of Present Illness   The patient, with a history of hypertension and heart disease, presents for a routine follow-up. He reports no new health issues since the last visit. The patient denies any other complaints or symptoms.  The patient is currently on metoprolol 25mg  once daily for heart rate and blood pressure control. He denies taking losartan, a medication listed in his records. The patient's blood pressure readings during the visit were slightly elevated, but he attributes this to caffeine intake and movement during the measurement. The patient's cholesterol levels were reported as satisfactory from a recent blood test. He denies headache or chest pain. States that he has been following up with his cardiologist as scheduled.      Past Medical History:  Diagnosis Date   Colon polyp    GERD (gastroesophageal reflux disease)    Hyperlipidemia    Kidney stone    Personal history of kidney stones 04/18/2018    Medications: Outpatient Medications Prior to Visit  Medication Sig   acidophilus (RISAQUAD) CAPS capsule Take 1 capsule by mouth daily.   aspirin EC 81 MG tablet Take 1 tablet (81 mg total) by mouth daily. Swallow whole.   atorvastatin (LIPITOR) 80  MG tablet Take 1 tablet (80 mg total) by mouth every evening.   Coenzyme Q10 (CO Q 10 PO) Take by mouth.   Magnesium 250 MG TABS Take by mouth.   metoprolol succinate (TOPROL-XL) 25 MG 24 hr tablet Take 1 tablet (25 mg total) by mouth daily.   Omega-3 Fatty Acids (OMEGA 3 PO) Take 3,000 mg by mouth 2 (two) times daily.   ticagrelor (BRILINTA) 60 MG TABS tablet Take 1 tablet (60 mg total) by mouth 2 (two) times daily.   vitamin E 400 UNIT capsule Take 400 Units by mouth daily. (Patient not taking: Reported on 02/01/2023)   [DISCONTINUED] ticagrelor (BRILINTA) 60 MG TABS tablet Take 1 tablet (60 mg total) by mouth 2 (two) times daily.   No facility-administered medications prior to visit.    Review of Systems  Last metabolic panel Lab Results  Component Value Date   GLUCOSE 98 01/11/2023   NA 141 01/11/2023   K 4.9 01/11/2023   CL 104 01/11/2023   CO2 21 01/11/2023   BUN 13 01/11/2023   CREATININE 0.85 01/11/2023   EGFR 96 01/11/2023   CALCIUM 9.3 01/11/2023   PROT 6.7 01/11/2023   ALBUMIN 4.2 01/11/2023   LABGLOB 2.5 01/11/2023   AGRATIO 1.7 01/11/2023   BILITOT 0.9 01/11/2023   ALKPHOS 58 01/11/2023   AST 22 01/11/2023   ALT 31 01/11/2023   ANIONGAP 5 04/15/2022  Last lipids Lab Results  Component Value Date   CHOL 120 01/11/2023   HDL 45 01/11/2023   LDLCALC 59 01/11/2023   TRIG 77 01/11/2023   CHOLHDL 2.7 01/11/2023        Objective    BP 134/80   Pulse 64   Temp 98.4 F (36.9 C) (Oral)   Ht 5\' 8"  (1.727 m)   Wt 203 lb (92.1 kg)   SpO2 96%   BMI 30.87 kg/m    BP Readings from Last 3 Encounters:  06/14/23 134/80  05/31/23 (!) 140/80  05/03/23 132/72   Wt Readings from Last 3 Encounters:  06/14/23 203 lb (92.1 kg)  05/31/23 200 lb 12.8 oz (91.1 kg)  05/03/23 200 lb 6.4 oz (90.9 kg)       Physical Exam Vitals reviewed.  Constitutional:      General: He is not in acute distress.    Appearance: Normal appearance. He is not ill-appearing,  toxic-appearing or diaphoretic.  Eyes:     Conjunctiva/sclera: Conjunctivae normal.  Cardiovascular:     Rate and Rhythm: Normal rate and regular rhythm.     Pulses: Normal pulses.     Heart sounds: Normal heart sounds. No murmur heard.    No friction rub. No gallop.  Pulmonary:     Effort: Pulmonary effort is normal. No respiratory distress.     Breath sounds: Normal breath sounds. No stridor. No wheezing, rhonchi or rales.  Abdominal:     General: Bowel sounds are normal. There is no distension.     Palpations: Abdomen is soft.     Tenderness: There is no abdominal tenderness.  Neurological:     Mental Status: He is alert and oriented to person, place, and time.       No results found for any visits on 06/14/23.  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 Improved on recheck to 134/80. Patient is on Metoprolol 25mg  daily. -Chronic  -continue to follow up with cardiology as scheduled  -Continue Metoprolol 25mg  daily.               General Health Maintenance -Encourage regular medical follow-ups and adherence to prescribed medications.         Return in about 4 months (around 10/15/2023) for CHRONIC F/U.         Ronnald Ramp, MD  [facility] [phone] (phone) [phone] (fax)  [facility]

## 2023-06-14 NOTE — Assessment & Plan Note (Signed)
Chronic Continue atorvastatin 80 mg daily 

## 2023-06-14 NOTE — Assessment & Plan Note (Signed)
  Blood pressure slightly elevated at 131/88, but patient is asymptomatic. Improved on recheck to 134/80. Patient is on Metoprolol 25mg  daily. -Chronic  -continue to follow up with cardiology as scheduled  -Continue Metoprolol 25mg  daily.

## 2023-06-18 ENCOUNTER — Encounter: Payer: Self-pay | Admitting: *Deleted

## 2023-06-21 ENCOUNTER — Other Ambulatory Visit: Payer: Self-pay

## 2023-06-25 ENCOUNTER — Encounter: Payer: Self-pay | Admitting: *Deleted

## 2023-06-28 ENCOUNTER — Encounter: Payer: Self-pay | Admitting: *Deleted

## 2023-06-28 ENCOUNTER — Ambulatory Visit: Payer: Managed Care, Other (non HMO) | Admitting: Anesthesiology

## 2023-06-28 ENCOUNTER — Ambulatory Visit
Admission: RE | Admit: 2023-06-28 | Discharge: 2023-06-28 | Disposition: A | Discharge status: Home / Self Care | Payer: Managed Care, Other (non HMO) | Attending: Gastroenterology | Admitting: Gastroenterology

## 2023-06-28 ENCOUNTER — Encounter
Admission: RE | Disposition: A | Discharge status: Home / Self Care | Payer: Self-pay | Source: Home / Self Care | Attending: Gastroenterology

## 2023-06-28 ENCOUNTER — Other Ambulatory Visit: Payer: Self-pay

## 2023-06-28 DIAGNOSIS — K449 Diaphragmatic hernia without obstruction or gangrene: Secondary | ICD-10-CM | POA: Diagnosis not present

## 2023-06-28 DIAGNOSIS — D123 Benign neoplasm of transverse colon: Secondary | ICD-10-CM | POA: Insufficient documentation

## 2023-06-28 DIAGNOSIS — K5730 Diverticulosis of large intestine without perforation or abscess without bleeding: Secondary | ICD-10-CM | POA: Insufficient documentation

## 2023-06-28 DIAGNOSIS — Z1211 Encounter for screening for malignant neoplasm of colon: Secondary | ICD-10-CM | POA: Insufficient documentation

## 2023-06-28 DIAGNOSIS — Z7901 Long term (current) use of anticoagulants: Secondary | ICD-10-CM | POA: Insufficient documentation

## 2023-06-28 DIAGNOSIS — D122 Benign neoplasm of ascending colon: Secondary | ICD-10-CM | POA: Insufficient documentation

## 2023-06-28 DIAGNOSIS — K222 Esophageal obstruction: Secondary | ICD-10-CM | POA: Diagnosis not present

## 2023-06-28 DIAGNOSIS — K640 First degree hemorrhoids: Secondary | ICD-10-CM | POA: Diagnosis not present

## 2023-06-28 DIAGNOSIS — R1310 Dysphagia, unspecified: Secondary | ICD-10-CM | POA: Insufficient documentation

## 2023-06-28 DIAGNOSIS — D120 Benign neoplasm of cecum: Secondary | ICD-10-CM | POA: Insufficient documentation

## 2023-06-28 HISTORY — PX: POLYPECTOMY: SHX5525

## 2023-06-28 HISTORY — PX: COLONOSCOPY WITH PROPOFOL: SHX5780

## 2023-06-28 HISTORY — DX: Personal history of urinary calculi: Z87.442

## 2023-06-28 HISTORY — PX: HEMOSTASIS CLIP PLACEMENT: SHX6857

## 2023-06-28 HISTORY — PX: SUBMUCOSAL LIFTING INJECTION: SHX6855

## 2023-06-28 HISTORY — PX: SUBMUCOSAL TATTOO INJECTION: SHX6856

## 2023-06-28 HISTORY — PX: ESOPHAGOGASTRODUODENOSCOPY (EGD) WITH PROPOFOL: SHX5813

## 2023-06-28 SURGERY — COLONOSCOPY WITH PROPOFOL
Anesthesia: General

## 2023-06-28 MED ORDER — DEXMEDETOMIDINE HCL IN NACL 200 MCG/50ML IV SOLN
INTRAVENOUS | Status: DC | PRN
Start: 2023-06-28 — End: 2023-06-28
  Administered 2023-06-28: 4 ug via INTRAVENOUS

## 2023-06-28 MED ORDER — SODIUM CHLORIDE 0.9 % IV SOLN: INTRAVENOUS | Status: DC

## 2023-06-28 MED ORDER — PROPOFOL 10 MG/ML IV BOLUS
INTRAVENOUS | Status: DC | PRN
Start: 2023-06-28 — End: 2023-06-28
  Administered 2023-06-28: 150 ug/kg/min via INTRAVENOUS
  Administered 2023-06-28 (×2): 100 mg via INTRAVENOUS

## 2023-06-28 MED ORDER — LIDOCAINE HCL (PF) 2 % IJ SOLN
INTRAMUSCULAR | Status: DC | PRN
Start: 2023-06-28 — End: 2023-06-28
  Administered 2023-06-28 (×2): 100 mg via INTRADERMAL

## 2023-06-28 NOTE — Transfer of Care (Signed)
Immediate Anesthesia Transfer of Care Note  Patient: Richard Warren  Procedure(s) Performed: COLONOSCOPY WITH PROPOFOL ESOPHAGOGASTRODUODENOSCOPY (EGD) WITH PROPOFOL  Patient Location: PACU  Anesthesia Type:General  Level of Consciousness: drowsy  Airway & Oxygen Therapy: Patient Spontanous Breathing and Patient connected to nasal cannula oxygen  Post-op Assessment: Report given to RN and Post -op Vital signs reviewed and stable  Post vital signs: stable  Last Vitals:  Vitals Value Taken Time  BP    Temp    Pulse    Resp    SpO2      Last Pain:  Vitals:   06/28/23 0949  TempSrc: Temporal  PainSc: 0-No pain         Complications: No notable events documented.

## 2023-06-28 NOTE — Anesthesia Preprocedure Evaluation (Addendum)
Anesthesia Evaluation  Patient identified by MRN, date of birth, ID band Patient awake    Reviewed: Allergy & Precautions, NPO status , Patient's Chart, lab work & pertinent test results  History of Anesthesia Complications Negative for: history of anesthetic complications  Airway Mallampati: III  TM Distance: <3 FB Neck ROM: full    Dental  (+) Chipped   Pulmonary neg pulmonary ROS, neg shortness of breath   Pulmonary exam normal        Cardiovascular Exercise Tolerance: Good (-) angina (-) Past MI negative cardio ROS Normal cardiovascular exam     Neuro/Psych negative neurological ROS  negative psych ROS   GI/Hepatic Neg liver ROS,GERD  Controlled,,  Endo/Other  negative endocrine ROS    Renal/GU Renal disease  negative genitourinary   Musculoskeletal   Abdominal   Peds  Hematology negative hematology ROS (+)   Anesthesia Other Findings Patient reports that they do not think that any food or pills are stuck in their throat at this time.  Past Medical History: No date: Colon polyp No date: GERD (gastroesophageal reflux disease) No date: History of kidney stones No date: Hyperlipidemia 04/18/2018: Personal history of kidney stones  Past Surgical History: 1968?: APPENDECTOMY 06/28/2009: COLONOSCOPY     Comment:  Dr Lemar Livings No date: CORONARY ANGIOPLASTY 03/03/2022: CORONARY STENT INTERVENTION; N/A     Comment:  Procedure: CORONARY STENT INTERVENTION;  Surgeon: Yvonne Kendall, MD;  Location: [facility];                Service: Cardiovascular;  Laterality: N/A; 01/07/2017: EXTRACORPOREAL SHOCK WAVE LITHOTRIPSY; Left     Comment:  Procedure: EXTRACORPOREAL SHOCK WAVE LITHOTRIPSY (ESWL);              Surgeon: Hildred Laser, MD;  Location: [facility];                Service: Urology;  Laterality: Left; 03/03/2022: LEFT HEART CATH AND CORONARY ANGIOGRAPHY; N/A     Comment:   Procedure: LEFT HEART CATH AND CORONARY ANGIOGRAPHY;                Surgeon: Laurier Nancy, MD;  Location: [facility]              LAB;  Service: Cardiovascular;  Laterality: N/A; 06/28/2009: UPPER GI ENDOSCOPY     Comment:  Dr Lemar Livings No date: VASECTOMY  BMI    Body Mass Index: 29.53 kg/m      Reproductive/Obstetrics negative OB ROS                             Anesthesia Physical Anesthesia Plan  ASA: 2  Anesthesia Plan: General   Post-op Pain Management:    Induction: Intravenous  PONV Risk Score and Plan: Propofol infusion and TIVA  Airway Management Planned: Natural Airway and Nasal Cannula  Additional Equipment:   Intra-op Plan:   Post-operative Plan:   Informed Consent: I have reviewed the patients History and Physical, chart, labs and discussed the procedure including the risks, benefits and alternatives for the proposed anesthesia with the patient or authorized representative who has indicated his/her understanding and acceptance.     Dental Advisory Given  Plan Discussed with: Anesthesiologist, CRNA and Surgeon  Anesthesia Plan Comments: (Patient consented for risks of anesthesia including but not limited to:  - adverse reactions to medications -  risk of airway placement if required - damage to eyes, teeth, lips or other oral mucosa - nerve damage due to positioning  - sore throat or hoarseness - Damage to heart, brain, nerves, lungs, other parts of body or loss of life  Patient voiced understanding and assent.)       Anesthesia Quick Evaluation

## 2023-06-28 NOTE — Anesthesia Postprocedure Evaluation (Signed)
Anesthesia Post Note  Patient: Richard Warren  Procedure(s) Performed: COLONOSCOPY WITH PROPOFOL ESOPHAGOGASTRODUODENOSCOPY (EGD) WITH PROPOFOL POLYPECTOMY SUBMUCOSAL LIFTING INJECTION HEMOSTASIS CLIP PLACEMENT SUBMUCOSAL TATTOO INJECTION  Patient location during evaluation: Endoscopy Anesthesia Type: General Level of consciousness: awake and alert Pain management: pain level controlled Vital Signs Assessment: post-procedure vital signs reviewed and stable Respiratory status: spontaneous breathing, nonlabored ventilation, respiratory function stable and patient connected to nasal cannula oxygen Cardiovascular status: blood pressure returned to baseline and stable Postop Assessment: no apparent nausea or vomiting Anesthetic complications: no   There were no known notable events for this encounter.   Last Vitals:  Vitals:   06/28/23 1200 06/28/23 1210  BP: 116/84 124/68  Pulse: 65 72  Resp: 16   Temp:    SpO2: 98%     Last Pain:  Vitals:   06/28/23 1210  TempSrc:   PainSc: 0-No pain                 Cleda Mccreedy Richard Warren

## 2023-06-28 NOTE — Interval H&P Note (Signed)
History and Physical Interval Note:  06/28/2023 10:34 AM  [place]  has presented today for surgery, with the diagnosis of h/o TA polyps gerd.  The various methods of treatment have been discussed with the patient and family. After consideration of risks, benefits and other options for treatment, the patient has consented to  Procedure(s): COLONOSCOPY WITH PROPOFOL (N/A) ESOPHAGOGASTRODUODENOSCOPY (EGD) WITH PROPOFOL (N/A) as a surgical intervention.  The patient's history has been reviewed, patient examined, no change in status, stable for surgery.  I have reviewed the patient's chart and labs.  Questions were answered to the patient's satisfaction.     Regis Bill  Ok to proceed with EGD/Colonoscopy

## 2023-06-28 NOTE — Op Note (Signed)
[facility] Gastroenterology Patient Name: Richard Warren Procedure Date: 06/28/2023 10:34 AM MRN: [id number] Account #: [id number] Date of Birth: Jan 27, 1956 Admit Type: Outpatient Age: 67 Room: [facility] ENDO ROOM 3 Gender: Male Note Status: Finalized Instrument Name: Nelda Marseille [id number] Procedure:             Colonoscopy Indications:           Surveillance: Personal history of adenomatous polyps                         on last colonoscopy > 5 years ago Providers:             Eather Colas MD, MD Referring MD:          No Local Md, MD (Referring MD) Medicines:             Monitored Anesthesia Care Complications:         No immediate complications. Estimated blood loss:                         Minimal. Procedure:             Pre-Anesthesia Assessment:                        - Prior to the procedure, a History and Physical was                         performed, and patient medications and allergies were                         reviewed. The patient is competent. The risks and                         benefits of the procedure and the sedation options and                         risks were discussed with the patient. All questions                         were answered and informed consent was obtained.                         Patient identification and proposed procedure were                         verified by the physician, the nurse, the                         anesthesiologist, the anesthetist and the technician                         in the endoscopy suite. Mental Status Examination:                         alert and oriented. Airway Examination: normal                         oropharyngeal airway and neck mobility. Respiratory  Examination: clear to auscultation. CV Examination:                         normal. Prophylactic Antibiotics: The patient does not                         require prophylactic antibiotics. Prior                          Anticoagulants: The patient has taken Brilinta                         (ticagrelor), last dose was 4 days prior to procedure.                         ASA Grade Assessment: II - A patient with mild                         systemic disease. After reviewing the risks and                         benefits, the patient was deemed in satisfactory                         condition to undergo the procedure. The anesthesia                         plan was to use monitored anesthesia care (MAC).                         Immediately prior to administration of medications,                         the patient was re-assessed for adequacy to receive                         sedatives. The heart rate, respiratory rate, oxygen                         saturations, blood pressure, adequacy of pulmonary                         ventilation, and response to care were monitored                         throughout the procedure. The physical status of the                         patient was re-assessed after the procedure.                        After obtaining informed consent, the colonoscope was                         passed under direct vision. Throughout the procedure,                         the patient's blood pressure, pulse, and oxygen  saturations were monitored continuously. The                         Colonoscope was introduced through the anus and                         advanced to the the cecum, identified by appendiceal                         orifice and ileocecal valve. The colonoscopy was                         performed without difficulty. The patient tolerated                         the procedure well. The quality of the bowel                         preparation was good. The ileocecal valve, appendiceal                         orifice, and rectum were photographed. Findings:      The perianal and digital rectal examinations were normal.      Two sessile polyps  were found in the cecum. The polyps were 2 mm in       size. These polyps were removed with a cold snare. Resection and       retrieval were complete. Estimated blood loss was minimal.      A 4 mm polyp was found in the ascending colon. The polyp was sessile.       The polyp was removed with a cold snare. Resection and retrieval were       complete. Estimated blood loss was minimal.      Scattered small-mouthed diverticula were found in the sigmoid colon,       descending colon, splenic flexure, transverse colon and ascending colon.      An 11 mm polyp was found in the transverse colon. The polyp was flat.       Preparations were made for mucosal resection. Demarcation of the lesion       was performed with narrow band imaging to clearly identify the       boundaries of the lesion. Eleview was injected to raise the lesion.       Snare mucosal resection was performed. Resection and retrieval were       complete. Removed piecemeal. To close a defect after mucosal resection,       two hemostatic clips were successfully placed. There was no bleeding       during, or at the end, of the procedure. Area was tattooed with an       injection of [place] ink.      Internal hemorrhoids were found during retroflexion. The hemorrhoids       were Grade I (internal hemorrhoids that do not prolapse). Impression:            - Two 2 mm polyps in the cecum, removed with a cold                         snare. Resected and retrieved.                        -  One 4 mm polyp in the ascending colon, removed with                         a cold snare. Resected and retrieved.                        - Diverticulosis in the sigmoid colon, in the                         descending colon, at the splenic flexure, in the                         transverse colon and in the ascending colon.                        - One 11 mm polyp in the transverse colon, removed                         with mucosal resection. Resected and  retrieved. Clips                         were placed. Tattooed.                        - Internal hemorrhoids.                        - Mucosal resection was performed. Resection and                         retrieval were complete. Recommendation:        - Repeat colonoscopy in 1 year for surveillance after                         piecemeal polypectomy.                        - Return to referring physician as previously                         scheduled.                        - Discharge patient to home.                        - Resume previous diet.                        - Resume Brilinta (ticagrelor) at prior dose in 2 days.                        - Await pathology results. Procedure Code(s):     --- Professional ---                        [phone], Colonoscopy, flexible; with endoscopic mucosal                         resection  96045, 59, Colonoscopy, flexible; with removal of                         tumor(s), polyp(s), or other lesion(s) by snare                         technique Diagnosis Code(s):     --- Professional ---                        Z86.010, Personal history of colonic polyps                        D12.0, Benign neoplasm of cecum                        D12.2, Benign neoplasm of ascending colon                        D12.3, Benign neoplasm of transverse colon (hepatic                         flexure or splenic flexure)                        K64.0, First degree hemorrhoids                        K57.30, Diverticulosis of large intestine without                         perforation or abscess without bleeding CPT copyright 2022 American Medical Association. All rights reserved. The codes documented in this report are preliminary and upon coder review may  be revised to meet current compliance requirements. Eather Colas MD, MD 06/28/2023 11:30:26 AM Number of Addenda: 0 Note Initiated On: 06/28/2023 10:34 AM Scope Withdrawal Time: 0 hours 24  minutes 28 seconds  Total Procedure Duration: 0 hours 29 minutes 30 seconds  Estimated Blood Loss:  Estimated blood loss was minimal.      [facility]

## 2023-06-28 NOTE — H&P (Signed)
Outpatient short stay form Pre-procedure 06/28/2023  Richard Bill, MD  Primary Physician: Ronnald Ramp, MD  Reason for visit:  Dysphagia/History of villous adenoma  History of present illness:    67 y/o gentleman with history of CAD on aspirin/brilinta (last dose of brilinta about 4 days ago) here for EGD/Colonoscopy for solid food dysphagia and history of villous adenoma on colonoscopy in 2015. No family history of GI malignancies. History of appendectomy.    Current Facility-Administered Medications:    0.9 %  sodium chloride infusion, , Intravenous, Continuous, Analiza Cowger, Rossie Muskrat, MD  Medications Prior to Admission  Medication Sig Dispense Refill Last Dose   aspirin EC 81 MG tablet Take 1 tablet (81 mg total) by mouth daily. Swallow whole. 30 tablet 12 Past Week   atorvastatin (LIPITOR) 80 MG tablet Take 1 tablet (80 mg total) by mouth every evening. 90 tablet 1 06/27/2023   b complex vitamins capsule Take 1 capsule by mouth daily.   Past Week   cholecalciferol (VITAMIN D3) 25 MCG (1000 UNIT) tablet Take 1,000 Units by mouth daily.      lovastatin (MEVACOR) 20 MG tablet Take 40 mg by mouth at bedtime.   Past Week   Magnesium 250 MG TABS Take by mouth.   Past Week   Multiple Vitamins-Minerals (MULTIVITAMIN WITH MINERALS) tablet Take 1 tablet by mouth daily.   Past Week   Omega-3 Fatty Acids (OMEGA 3 PO) Take 3,000 mg by mouth 2 (two) times daily.   Past Week   selenium 50 MCG TABS tablet Take 200 mcg by mouth daily.   Past Week   Zinc Citrate-Phytase 25-500 MG CAPS Take 25-500 mg by mouth daily.   Past Week   acidophilus (RISAQUAD) CAPS capsule Take 1 capsule by mouth daily.      albuterol (VENTOLIN HFA) 108 (90 Base) MCG/ACT inhaler Inhale into the lungs every 6 (six) hours as needed for wheezing or shortness of breath. (Patient not taking: Reported on 06/21/2023)   Not Taking   Coenzyme Q10 (CO Q 10 PO) Take by mouth.      metoprolol succinate (TOPROL-XL) 25 MG  24 hr tablet Take 1 tablet (25 mg total) by mouth daily. 30 tablet 2 06/24/2023   ticagrelor (BRILINTA) 60 MG TABS tablet Take 1 tablet (60 mg total) by mouth 2 (two) times daily. 60 tablet 2 06/24/2023   vitamin E 400 UNIT capsule Take 400 Units by mouth daily. (Patient not taking: Reported on 02/01/2023)        Allergies  Allergen Reactions   Zolpidem     Depression     Past Medical History:  Diagnosis Date   Colon polyp    GERD (gastroesophageal reflux disease)    History of kidney stones    Hyperlipidemia    Personal history of kidney stones 04/18/2018    Review of systems:  Otherwise negative.    Physical Exam  Gen: Alert, oriented. Appears stated age.  HEENT: PERRLA. Lungs: No respiratory distress CV: RRR Abd: soft, benign, no masses Ext: No edema    Planned procedures: Proceed with EGD/colonoscopy. The patient understands the nature of the planned procedure, indications, risks, alternatives and potential complications including but not limited to bleeding, infection, perforation, damage to internal organs and possible oversedation/side effects from anesthesia. The patient agrees and gives consent to proceed.  Please refer to procedure notes for findings, recommendations and patient disposition/instructions.     Richard Bill, MD [facility] Gastroenterology

## 2023-06-28 NOTE — Op Note (Signed)
[facility] Gastroenterology Patient Name: Richard Warren Procedure Date: 06/28/2023 10:35 AM MRN: [id number] Account #: [id number] Date of Birth: 26-Nov-1955 Admit Type: Outpatient Age: 67 Room: [facility] ENDO ROOM 3 Gender: Male Note Status: Finalized Instrument Name: Upper Endoscope [id number] Procedure:             Upper GI endoscopy Indications:           Dysphagia Providers:             Eather Colas MD, MD Referring MD:          No Local Md, MD (Referring MD) Medicines:             Monitored Anesthesia Care Complications:         No immediate complications. Procedure:             Pre-Anesthesia Assessment:                        - Prior to the procedure, a History and Physical was                         performed, and patient medications and allergies were                         reviewed. The patient is competent. The risks and                         benefits of the procedure and the sedation options and                         risks were discussed with the patient. All questions                         were answered and informed consent was obtained.                         Patient identification and proposed procedure were                         verified by the physician, the nurse, the                         anesthesiologist, the anesthetist and the technician                         in the endoscopy suite. Mental Status Examination:                         alert and oriented. Airway Examination: normal                         oropharyngeal airway and neck mobility. Respiratory                         Examination: clear to auscultation. CV Examination:                         normal. Prophylactic Antibiotics: The patient does not  require prophylactic antibiotics. Prior                         Anticoagulants: The patient has taken Brilinta                         (ticagrelor), last dose was 4 days prior to procedure.                          ASA Grade Assessment: II - A patient with mild                         systemic disease. After reviewing the risks and                         benefits, the patient was deemed in satisfactory                         condition to undergo the procedure. The anesthesia                         plan was to use monitored anesthesia care (MAC).                         Immediately prior to administration of medications,                         the patient was re-assessed for adequacy to receive                         sedatives. The heart rate, respiratory rate, oxygen                         saturations, blood pressure, adequacy of pulmonary                         ventilation, and response to care were monitored                         throughout the procedure. The physical status of the                         patient was re-assessed after the procedure.                        After obtaining informed consent, the endoscope was                         passed under direct vision. Throughout the procedure,                         the patient's blood pressure, pulse, and oxygen                         saturations were monitored continuously. The Endoscope                         was introduced through the mouth, and advanced to the  second part of duodenum. The upper GI endoscopy was                         accomplished without difficulty. The patient tolerated                         the procedure well. Findings:      LA Grade D (one or more mucosal breaks involving at least 75% of       esophageal circumference) esophagitis with bleeding was found.      One benign-appearing, intrinsic mild stenosis was found. This stenosis       measured less than one cm (in length). The stenosis was traversed.      A small hiatal hernia was present.      The entire examined stomach was normal.      The examined duodenum was normal. Impression:            - LA Grade D reflux  esophagitis with bleeding.                        - Benign-appearing esophageal stenosis.                        - Small hiatal hernia.                        - Normal stomach.                        - Normal examined duodenum.                        - No specimens collected. Recommendation:        - Discharge patient to home.                        - Resume previous diet.                        - Use a proton pump inhibitor PO BID for 8 weeks.                        - Repeat upper endoscopy in 2 months to check healing.                        - Return to referring physician as previously                         scheduled. Procedure Code(s):     --- Professional ---                        [phone], Esophagogastroduodenoscopy, flexible,                         transoral; diagnostic, including collection of                         specimen(s) by brushing or washing, when performed                         (separate procedure) Diagnosis  Code(s):     --- Professional ---                        K21.01, Gastro-esophageal reflux disease with                         esophagitis, with bleeding                        K22.2, Esophageal obstruction                        K44.9, Diaphragmatic hernia without obstruction or                         gangrene                        R13.10, Dysphagia, unspecified CPT copyright 2022 American Medical Association. All rights reserved. The codes documented in this report are preliminary and upon coder review may  be revised to meet current compliance requirements. Eather Colas MD, MD 06/28/2023 11:23:08 AM Number of Addenda: 0 Note Initiated On: 06/28/2023 10:35 AM Estimated Blood Loss:  Estimated blood loss: none.      [facility]

## 2023-06-29 ENCOUNTER — Encounter: Payer: Self-pay | Admitting: Gastroenterology

## 2023-07-02 LAB — SURGICAL PATHOLOGY

## 2023-08-02 ENCOUNTER — Ambulatory Visit: Payer: Managed Care, Other (non HMO) | Admitting: Cardiovascular Disease

## 2023-08-02 ENCOUNTER — Encounter: Payer: Self-pay | Admitting: Cardiovascular Disease

## 2023-08-02 VITALS — BP 126/76 | HR 62 | Ht 69.0 in | Wt 212.6 lb

## 2023-08-02 DIAGNOSIS — I2510 Atherosclerotic heart disease of native coronary artery without angina pectoris: Secondary | ICD-10-CM | POA: Diagnosis not present

## 2023-08-02 DIAGNOSIS — I455 Other specified heart block: Secondary | ICD-10-CM | POA: Diagnosis not present

## 2023-08-02 DIAGNOSIS — E7800 Pure hypercholesterolemia, unspecified: Secondary | ICD-10-CM

## 2023-08-02 DIAGNOSIS — R0789 Other chest pain: Secondary | ICD-10-CM

## 2023-08-02 DIAGNOSIS — I249 Acute ischemic heart disease, unspecified: Secondary | ICD-10-CM | POA: Diagnosis not present

## 2023-08-02 DIAGNOSIS — I10 Essential (primary) hypertension: Secondary | ICD-10-CM

## 2023-08-02 DIAGNOSIS — Z9861 Coronary angioplasty status: Secondary | ICD-10-CM

## 2023-08-02 NOTE — Progress Notes (Signed)
Cardiology Office Note   Date:  08/02/2023   ID:  Richard Warren, DOB 1955/11/27, MRN [id number]  PCP:  Ronnald Ramp, MD  Cardiologist:  Adrian Blackwater, MD      History of Present Illness: Richard Warren is a 67 y.o. male who presents for  Chief Complaint  Patient presents with   Follow-up    2 month follow up    Doing well      Past Medical History:  Diagnosis Date   Colon polyp    GERD (gastroesophageal reflux disease)    History of kidney stones    Hyperlipidemia    Personal history of kidney stones 04/18/2018     Past Surgical History:  Procedure Laterality Date   APPENDECTOMY  1968?   COLONOSCOPY  06/28/2009   Dr Lemar Livings   COLONOSCOPY WITH PROPOFOL N/A 06/28/2023   Procedure: COLONOSCOPY WITH PROPOFOL;  Surgeon: Regis Bill, MD;  Location: [facility] ENDOSCOPY;  Service: Endoscopy;  Laterality: N/A;   CORONARY ANGIOPLASTY     CORONARY STENT INTERVENTION N/A 03/03/2022   Procedure: CORONARY STENT INTERVENTION;  Surgeon: Yvonne Kendall, MD;  Location: [facility] INVASIVE CV LAB;  Service: Cardiovascular;  Laterality: N/A;   ESOPHAGOGASTRODUODENOSCOPY (EGD) WITH PROPOFOL N/A 06/28/2023   Procedure: ESOPHAGOGASTRODUODENOSCOPY (EGD) WITH PROPOFOL;  Surgeon: Regis Bill, MD;  Location: [facility] ENDOSCOPY;  Service: Endoscopy;  Laterality: N/A;   EXTRACORPOREAL SHOCK WAVE LITHOTRIPSY Left 01/07/2017   Procedure: EXTRACORPOREAL SHOCK WAVE LITHOTRIPSY (ESWL);  Surgeon: Hildred Laser, MD;  Location: [facility] ORS;  Service: Urology;  Laterality: Left;   HEMOSTASIS CLIP PLACEMENT  06/28/2023   Procedure: HEMOSTASIS CLIP PLACEMENT;  Surgeon: Regis Bill, MD;  Location: [facility] ENDOSCOPY;  Service: Endoscopy;;   LEFT HEART CATH AND CORONARY ANGIOGRAPHY N/A 03/03/2022   Procedure: LEFT HEART CATH AND CORONARY ANGIOGRAPHY;  Surgeon: Laurier Nancy, MD;  Location: [facility] INVASIVE CV LAB;  Service: Cardiovascular;  Laterality: N/A;   POLYPECTOMY   06/28/2023   Procedure: POLYPECTOMY;  Surgeon: Regis Bill, MD;  Location: [facility] ENDOSCOPY;  Service: Endoscopy;;   SUBMUCOSAL LIFTING INJECTION  06/28/2023   Procedure: SUBMUCOSAL LIFTING INJECTION;  Surgeon: Regis Bill, MD;  Location: [facility] ENDOSCOPY;  Service: Endoscopy;;   SUBMUCOSAL TATTOO INJECTION  06/28/2023   Procedure: SUBMUCOSAL TATTOO INJECTION;  Surgeon: Regis Bill, MD;  Location: [facility] ENDOSCOPY;  Service: Endoscopy;;   UPPER GI ENDOSCOPY  06/28/2009   Dr Lemar Livings   VASECTOMY       Current Outpatient Medications  Medication Sig Dispense Refill   acidophilus (RISAQUAD) CAPS capsule Take 1 capsule by mouth daily.     aspirin EC 81 MG tablet Take 1 tablet (81 mg total) by mouth daily. Swallow whole. 30 tablet 12   atorvastatin (LIPITOR) 80 MG tablet Take 1 tablet (80 mg total) by mouth every evening. 90 tablet 1   b complex vitamins capsule Take 1 capsule by mouth daily.     cholecalciferol (VITAMIN D3) 25 MCG (1000 UNIT) tablet Take 1,000 Units by mouth daily.     Coenzyme Q10 (CO Q 10 PO) Take by mouth.     Magnesium 250 MG TABS Take by mouth.     metoprolol succinate (TOPROL-XL) 25 MG 24 hr tablet Take 1 tablet (25 mg total) by mouth daily. 30 tablet 2   Multiple Vitamins-Minerals (MULTIVITAMIN WITH MINERALS) tablet Take 1 tablet by mouth daily.     Omega-3 Fatty Acids (OMEGA 3 PO) Take 3,000 mg by mouth 2 (two)  times daily.     pantoprazole (PROTONIX) 40 MG tablet Take 40 mg by mouth daily.     selenium 50 MCG TABS tablet Take 200 mcg by mouth daily.     ticagrelor (BRILINTA) 60 MG TABS tablet Take 1 tablet (60 mg total) by mouth 2 (two) times daily. 60 tablet 2   vitamin E 400 UNIT capsule Take 400 Units by mouth daily. (Patient not taking: Reported on 02/01/2023)     Zinc Citrate-Phytase 25-500 MG CAPS Take 25-500 mg by mouth daily. (Patient not taking: Reported on 08/02/2023)     No current facility-administered medications for this visit.     Allergies:   Zolpidem    Social History:   reports that he has never smoked. He has never used smokeless tobacco. He reports current alcohol use of about 53.0 standard drinks of alcohol per week. He reports that he does not use drugs.   Family History:  family history includes Coronary artery disease in his sister; Dementia in his mother; Heart attack in his sister; Hypertension in his brother.    ROS:     Review of Systems  Constitutional: Negative.   HENT: Negative.    Eyes: Negative.   Respiratory: Negative.    Gastrointestinal: Negative.   Genitourinary: Negative.   Musculoskeletal: Negative.   Skin: Negative.   Neurological: Negative.   Endo/Heme/Allergies: Negative.   Psychiatric/Behavioral: Negative.    All other systems reviewed and are negative.     All other systems are reviewed and negative.    PHYSICAL EXAM: VS:  BP 126/76   Pulse 62   Ht 5\' 9"  (1.753 m)   Wt 212 lb 9.6 oz (96.4 kg)   SpO2 96%   BMI 31.40 kg/m  , BMI Body mass index is 31.4 kg/m. Last weight:  Wt Readings from Last 3 Encounters:  08/02/23 212 lb 9.6 oz (96.4 kg)  06/28/23 197 lb 12.8 oz (89.7 kg)  06/14/23 203 lb (92.1 kg)     Physical Exam Vitals reviewed.  Constitutional:      Appearance: Normal appearance. He is normal weight.  HENT:     Head: Normocephalic.     Nose: Nose normal.     Mouth/Throat:     Mouth: Mucous membranes are moist.  Eyes:     Pupils: Pupils are equal, round, and reactive to light.  Cardiovascular:     Rate and Rhythm: Normal rate and regular rhythm.     Pulses: Normal pulses.     Heart sounds: Normal heart sounds.  Pulmonary:     Effort: Pulmonary effort is normal.  Abdominal:     General: Abdomen is flat. Bowel sounds are normal.  Musculoskeletal:        General: Normal range of motion.     Cervical back: Normal range of motion.  Skin:    General: Skin is warm.  Neurological:     General: No focal deficit present.     Mental Status: He  is alert.  Psychiatric:        Mood and Affect: Mood normal.       EKG:   Recent Labs: 01/11/2023: ALT 31; BUN 13; Creatinine, Ser 0.85; Potassium 4.9; Sodium 141    Lipid Panel    Component Value Date/Time   CHOL 120 01/11/2023 1142   TRIG 77 01/11/2023 1142   HDL 45 01/11/2023 1142   CHOLHDL 2.7 01/11/2023 1142   CHOLHDL 4.9 03/04/2022 0512   VLDL 25 03/04/2022 0512  LDLCALC 59 01/11/2023 1142   LDLCALC 110 (H) 05/14/2017 1556      Other studies Reviewed: Additional studies/ records that were reviewed today include:  Review of the above records demonstrates:       No data to display            ASSESSMENT AND PLAN:    ICD-10-CM   1. Coronary artery disease involving native coronary artery of native 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 angina pectoris  I25.10    Doing well    2. Sinus arrest  I45.5     3. NSTEMI  I24.9     4. Hypercholesteremia  E78.00    Needs to continue statins lipitor 80    5. Primary hypertension  I10     6. CAD S/P percutaneous coronary angioplasty  I25.10    Z98.61     7. Other chest pain  R07.89        Problem List Items Addressed This Visit       Cardiovascular and Mediastinum   NSTEMI   Sinus arrest     Other   Hypercholesteremia   Other Visit Diagnoses     Coronary artery disease involving native coronary artery of native 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 angina pectoris    -  Primary   Doing well   Primary hypertension       CAD S/P percutaneous coronary angioplasty       Other chest pain              Disposition:   Return in about 3 months (around 11/02/2023).    Total time spent: 30 minutes  Signed,  Adrian Blackwater, MD  08/02/2023 10:39 AM    [facility]

## 2023-08-03 ENCOUNTER — Other Ambulatory Visit: Payer: Self-pay

## 2023-08-03 DIAGNOSIS — E7800 Pure hypercholesterolemia, unspecified: Secondary | ICD-10-CM

## 2023-08-03 MED ORDER — ATORVASTATIN CALCIUM 80 MG PO TABS: 80.0000 mg | ORAL_TABLET | Freq: Every evening | ORAL | 1 refills | Status: DC

## 2023-08-03 MED ORDER — TICAGRELOR 60 MG PO TABS: 60.0000 mg | ORAL_TABLET | Freq: Two times a day (BID) | ORAL | 2 refills | Status: DC

## 2023-08-03 MED ORDER — METOPROLOL SUCCINATE ER 25 MG PO TB24: 25.0000 mg | ORAL_TABLET | Freq: Every day | ORAL | 2 refills | Status: DC

## 2023-09-10 ENCOUNTER — Ambulatory Visit: Payer: Self-pay

## 2023-09-10 NOTE — Telephone Encounter (Signed)
     Chief Complaint: Lower abdominal pain, pain with urination Symptoms: Above Frequency: Last night Pertinent Negatives: Patient denies  Disposition: [] ED /[] Urgent Care (no appt availability in office) / [x] Appointment(In office/virtual)/ []  [place] [facility]/ [] Home Care/ [] Refused Recommended Disposition /[]  Mobile Bus/ []  Follow-up with PCP Additional Notes: Agrees with appointment.  Reason for Disposition  [1] MODERATE pain (e.g., interferes with normal activities) AND [2] pain comes and goes (cramps) AND [3] present > 24 hours  (Exception: Pain with Vomiting or Diarrhea - see that Guideline.)  Answer Assessment - Initial Assessment Questions 1. LOCATION: Where does it hurt?      Lower 2. RADIATION: Does the pain shoot anywhere else? (e.g., chest, back)     No 3. ONSET: When did the pain begin? (Minutes, hours or days ago)      Last night 4. SUDDEN: Gradual or sudden onset?     Sudden 5. PATTERN Does the pain come and go, or is it constant?    - If it comes and goes: How long does it last? Do you have pain now?     (Note: Comes and goes means the pain is intermittent. It goes away completely between bouts.)    - If constant: Is it getting better, staying the same, or getting worse?      (Note: Constant means the pain never goes away completely; most serious pain is constant and gets worse.)      Comes and goes 6. SEVERITY: How bad is the pain?  (e.g., Scale 1-10; mild, moderate, or severe)    - MILD (1-3): Doesn't interfere with normal activities, abdomen soft and not tender to touch.     - MODERATE (4-7): Interferes with normal activities or awakens from sleep, abdomen tender to touch.     - SEVERE (8-10): Excruciating pain, doubled over, unable to do any normal activities.       10 7. RECURRENT SYMPTOM: Have you ever had this type of stomach pain before? If Yes, ask: When was the last time? and What happened that time?      Yes 8.  CAUSE: What do you think is causing the stomach pain?     Unsure 9. RELIEVING/AGGRAVATING FACTORS: What makes it better or worse? (e.g., antacids, bending or twisting motion, bowel movement)     No 10. OTHER SYMPTOMS: Do you have any other symptoms? (e.g., back pain, diarrhea, fever, urination pain, vomiting)       Pain with voiding  Protocols used: Abdominal Pain - Male-A-AH

## 2023-09-13 ENCOUNTER — Ambulatory Visit: Payer: Managed Care, Other (non HMO) | Admitting: Family Medicine

## 2023-10-18 ENCOUNTER — Ambulatory Visit: Payer: Managed Care, Other (non HMO) | Admitting: Family Medicine

## 2023-10-18 ENCOUNTER — Encounter: Payer: Self-pay | Admitting: Family Medicine

## 2023-10-18 VITALS — BP 132/83 | HR 56 | Temp 97.6°F | Ht 69.0 in | Wt 216.0 lb

## 2023-10-18 DIAGNOSIS — R635 Abnormal weight gain: Secondary | ICD-10-CM | POA: Diagnosis not present

## 2023-10-18 DIAGNOSIS — R030 Elevated blood-pressure reading, without diagnosis of hypertension: Secondary | ICD-10-CM

## 2023-10-18 DIAGNOSIS — H9113 Presbycusis, bilateral: Secondary | ICD-10-CM

## 2023-10-18 DIAGNOSIS — E782 Mixed hyperlipidemia: Secondary | ICD-10-CM

## 2023-10-18 DIAGNOSIS — K219 Gastro-esophageal reflux disease without esophagitis: Secondary | ICD-10-CM

## 2023-10-18 NOTE — Progress Notes (Signed)
 Established patient visit   Patient: Richard Warren   DOB: 1956/01/30   68 y.o. Male  MRN: [id number] Visit Date: 10/18/2023  Today's healthcare provider: Mimi Alt, MD   Chief Complaint  Patient presents with   Weight managment    Patient states he is here for follow up of weight management and states he has gained 14 pounds since last visit   Subjective     HPI     Weight managment    Additional comments: Patient states he is here for follow up of weight management and states he has gained 14 pounds since last visit      Last edited by Eustaquio Hight, CMA on 10/18/2023 11:11 AM.       Discussed the use of AI scribe software for clinical note transcription with the patient, who gave verbal consent to proceed.  History of Present Illness   Richard Warren "Josefina Nian" is a 69 year old male with hypertension who presents for a chronic follow-up visit.  He has a history of elevated blood pressure, with today's reading at 132/83 mmHg. His current medications include Brilinta  60 mg, metoprolol  25 mg once daily, Lipitor 80 mg, aspirin , Protonix  40 mg, and selenium. He last Richard cardiology in November and has a follow-up scheduled for later this month.  He has experienced weight gain, noting an increase of 19 pounds since October, with his weight rising from 197 pounds to 212 pounds by November. He attributes this to increased snacking, particularly on chips and crackers, and acknowledges a lack of regular exercise, although he mentions having access to walking trails near his home.  He reports occasional swelling in his left foot and sometimes experiences difficulty breathing, which he associates with the onset of swelling. These breathing difficulties have occurred only a couple of times.  He has a history of receiving the first dose of the Shingrix  vaccine, which resulted in significant soreness in his arm, making it difficult to use. He has not yet received  the second dose.  He experiences some hearing difficulties, particularly in his right ear, which causes pain when using Q-tips. He suspects wax buildup might be contributing to his hearing issues.         Past Medical History:  Diagnosis Date   Colon polyp    GERD (gastroesophageal reflux disease)    History of kidney stones    Hyperlipidemia    Personal history of kidney stones 04/18/2018    Medications: Outpatient Medications Prior to Visit  Medication Sig   acidophilus (RISAQUAD) CAPS capsule Take 1 capsule by mouth daily.   aspirin  EC 81 MG tablet Take 1 tablet (81 mg total) by mouth daily. Swallow whole.   atorvastatin  (LIPITOR) 80 MG tablet Take 1 tablet (80 mg total) by mouth every evening.   Coenzyme Q10 (CO Q 10 PO) Take by mouth.   metoprolol  succinate (TOPROL -XL) 25 MG 24 hr tablet Take 1 tablet (25 mg total) by mouth daily.   Omega-3 Fatty Acids (OMEGA 3 PO) Take 3,000 mg by mouth 2 (two) times daily.   pantoprazole  (PROTONIX ) 40 MG tablet Take 40 mg by mouth daily.   selenium 50 MCG TABS tablet Take 200 mcg by mouth daily.   ticagrelor  (BRILINTA ) 60 MG TABS tablet Take 1 tablet (60 mg total) by mouth 2 (two) times daily.   [DISCONTINUED] b complex vitamins capsule Take 1 capsule by mouth daily.   [DISCONTINUED] cholecalciferol (VITAMIN D3) 25 MCG (1000  UNIT) tablet Take 1,000 Units by mouth daily.   [DISCONTINUED] Magnesium 250 MG TABS Take by mouth.   [DISCONTINUED] Multiple Vitamins-Minerals (MULTIVITAMIN WITH MINERALS) tablet Take 1 tablet by mouth daily.   [DISCONTINUED] vitamin E 400 UNIT capsule Take 400 Units by mouth daily. (Patient not taking: Reported on 02/01/2023)   [DISCONTINUED] Zinc Citrate-Phytase 25-500 MG CAPS Take 25-500 mg by mouth daily. (Patient not taking: Reported on 08/02/2023)   No facility-administered medications prior to visit.    Review of Systems  Last CBC Lab Results  Component Value Date   WBC 4.2 05/20/2022   HGB 15.6  05/20/2022   HCT 46.6 05/20/2022   MCV 96 05/20/2022   MCH 32.0 05/20/2022   RDW 13.6 05/20/2022   PLT 222 05/20/2022   Last metabolic panel Lab Results  Component Value Date   GLUCOSE 98 01/11/2023   NA 141 01/11/2023   K 4.9 01/11/2023   CL 104 01/11/2023   CO2 21 01/11/2023   BUN 13 01/11/2023   CREATININE 0.85 01/11/2023   EGFR 96 01/11/2023   CALCIUM  9.3 01/11/2023   PROT 6.7 01/11/2023   ALBUMIN 4.2 01/11/2023   LABGLOB 2.5 01/11/2023   AGRATIO 1.7 01/11/2023   BILITOT 0.9 01/11/2023   ALKPHOS 58 01/11/2023   AST 22 01/11/2023   ALT 31 01/11/2023   ANIONGAP 5 04/15/2022   Last lipids Lab Results  Component Value Date   CHOL 120 01/11/2023   HDL 45 01/11/2023   LDLCALC 59 01/11/2023   TRIG 77 01/11/2023   CHOLHDL 2.7 01/11/2023   Last hemoglobin A1c No results found for: "HGBA1C" Last thyroid functions Lab Results  Component Value Date   TSH 1.760 10/23/2019        Objective    BP 132/83 (BP Location: Right Arm, Patient Position: Sitting, Cuff Size: Normal)   Pulse (!) 56   Temp 97.6 F (36.4 C) (Oral)   Ht 5\' 9"  (1.753 m)   Wt 216 lb (98 kg)   SpO2 98%   BMI 31.90 kg/m  BP Readings from Last 3 Encounters:  10/18/23 132/83  08/02/23 126/76  06/28/23 124/68   Wt Readings from Last 3 Encounters:  10/18/23 216 lb (98 kg)  08/02/23 212 lb 9.6 oz (96.4 kg)  06/28/23 197 lb 12.8 oz (89.7 kg)        Physical Exam Vitals reviewed.  Constitutional:      General: He is not in acute distress.    Appearance: Normal appearance. He is not ill-appearing, toxic-appearing or diaphoretic.  Eyes:     Conjunctiva/sclera: Conjunctivae normal.  Cardiovascular:     Rate and Rhythm: Normal rate and regular rhythm.     Pulses: Normal pulses.     Heart sounds: Normal heart sounds. No murmur heard.    No friction rub. No gallop.  Pulmonary:     Effort: Pulmonary effort is normal. No respiratory distress.     Breath sounds: Normal breath sounds. No  stridor. No wheezing, rhonchi or rales.  Abdominal:     General: Bowel sounds are normal. There is no distension.     Palpations: Abdomen is soft.     Tenderness: There is no abdominal tenderness.  Musculoskeletal:     Right lower leg: No edema.     Left lower leg: No edema.  Skin:    Findings: No erythema or rash.  Neurological:     Mental Status: He is alert and oriented to person, place, and time.  Psychiatric:  Mood and Affect: Mood and affect normal.        Speech: Speech normal.        Behavior: Behavior normal. Behavior is cooperative.     Physical Exam   VITALS: BP- 139/62, BP- 132/83 HEENT: Right ear with possible sclerosis, no signs of infection       No results found for any visits on 10/18/23.  Assessment & Plan     Problem List Items Addressed This Visit       Digestive   Esophageal reflux   Managed with Protonix  40 mg. chronic - Continue Protonix  40 mg        Other   Elevated BP without diagnosis of hypertension   Blood pressure today is 132/83 mmHg, within acceptable range. Previous readings elevated without formal diagnosis. Currently on metoprolol  25 mg once daily for heart rate control. 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 Cholesterol panel recommended to assess current levels. - Order cholesterol panel      Relevant Orders   CMP14+EGFR   Lipid panel   Other Visit Diagnoses       Weight gain       Relevant Orders   CMP14+EGFR   Lipid panel   TSH+T4F+T3Free   Hemoglobin A1c     Presbycusis of both ears       Relevant Orders   Ambulatory referral to ENT          Heart Disease (NSTEMI history) Currently on Brilinta  60 mg, aspirin , and metoprolol . Last cardiology follow-up in November; next follow-up scheduled for later this month. - Continue Brilinta  60 mg - Continue aspirin  -  Continue metoprolol  25 mg - Follow up with cardiology as scheduled    Weight Gain Gained 19 pounds since October, current weight 212 lbs. Reports increased snacking on nuts and carbohydrates. Possible thyroid dysfunction contributing to weight gain. Discussed healthier snacking options and regular exercise. Recommended checking thyroid function and metabolic panel. - Order thyroid panel - Encourage healthier snacking options (e.g., vegetables) - Recommend regular exercise, such as walking - Order metabolic panel    Insomnia No specific treatment plan discussed during this visit. - Monitor sleep patterns and consider discussing treatment options if symptoms persist  Hearing Loss Reports difficulty hearing, especially in the right ear. Possible sclerosis observed in the eardrum. No signs of infection. Discussed referral to ENT for further evaluation and hearing test. - Refer to ENT for hearing test and further evaluation  General Health Maintenance Recommended vaccinations include tetanus booster, Shingrix , and pneumonia vaccine. Expressed reluctance to receive the second dose of Shingrix  due to soreness after the first dose. Discussed importance of completing Shingrix  series to prevent shingles and its complications. - Recommend tetanus booster - Recommend Shingrix  vaccine - Recommend pneumonia vaccine     Return in about 4 months (around 02/15/2024) for CPE.         Mimi Alt, MD  [facility] [phone] (phone) [phone] (fax)  [facility]

## 2023-10-18 NOTE — Assessment & Plan Note (Signed)
 Blood pressure today is 132/83 mmHg, within acceptable range. Previous readings elevated without formal diagnosis. Currently on metoprolol  25 mg once daily for heart rate control. - Continue metoprolol  25 mg once daily - Monitor blood pressure regularly

## 2023-10-18 NOTE — Assessment & Plan Note (Signed)
 chronic Currently on Lipitor 80 mg. Cholesterol panel recommended to assess current levels. - Order cholesterol panel

## 2023-10-18 NOTE — Assessment & Plan Note (Signed)
 Managed with Protonix  40 mg. chronic - Continue Protonix  40 mg

## 2023-10-19 LAB — CMP14+EGFR
ALT: 26 IU/L (ref 0–44)
AST: 18 IU/L (ref 0–40)
Albumin: 4.3 g/dL (ref 3.9–4.9)
Alkaline Phosphatase: 55 IU/L (ref 44–121)
BUN/Creatinine Ratio: 16 (ref 10–24)
BUN: 13 mg/dL (ref 8–27)
Bilirubin Total: 0.5 mg/dL (ref 0.0–1.2)
CO2: 22 mmol/L (ref 20–29)
Calcium: 9 mg/dL (ref 8.6–10.2)
Chloride: 109 mmol/L — ABNORMAL HIGH (ref 96–106)
Creatinine, Ser: 0.83 mg/dL (ref 0.76–1.27)
Globulin, Total: 2.9 g/dL (ref 1.5–4.5)
Glucose: 97 mg/dL (ref 70–99)
Potassium: 4.4 mmol/L (ref 3.5–5.2)
Sodium: 147 mmol/L — ABNORMAL HIGH (ref 134–144)
Total Protein: 7.2 g/dL (ref 6.0–8.5)
eGFR: 97 mL/min/1.73

## 2023-10-19 LAB — LIPID PANEL
Chol/HDL Ratio: 3.1 {ratio} (ref 0.0–5.0)
Cholesterol, Total: 139 mg/dL (ref 100–199)
HDL: 45 mg/dL (ref >39)
LDL Chol Calc (NIH): 71 mg/dL (ref 0–99)
Triglycerides: 127 mg/dL (ref 0–149)
VLDL Cholesterol Cal: 23 mg/dL (ref 5–40)

## 2023-10-19 LAB — TSH+T4F+T3FREE
Free T4: 1.18 ng/dL (ref 0.82–1.77)
T3, Free: 3.4 pg/mL (ref 2.0–4.4)
TSH: 1.09 u[IU]/mL (ref 0.450–4.500)

## 2023-10-19 LAB — HEMOGLOBIN A1C
Est. average glucose Bld gHb Est-mCnc: 123 mg/dL
Hgb A1c MFr Bld: 5.9 % — ABNORMAL HIGH (ref 4.8–5.6)

## 2023-10-20 ENCOUNTER — Encounter: Payer: Self-pay | Admitting: Family Medicine

## 2023-11-01 ENCOUNTER — Ambulatory Visit: Payer: Managed Care, Other (non HMO) | Admitting: Cardiovascular Disease

## 2023-11-15 ENCOUNTER — Emergency Department (HOSPITAL_COMMUNITY)

## 2023-11-15 ENCOUNTER — Other Ambulatory Visit: Payer: Self-pay

## 2023-11-15 ENCOUNTER — Emergency Department (HOSPITAL_COMMUNITY)
Admission: EM | Admit: 2023-11-15 | Discharge: 2023-11-15 | Disposition: A | Discharge status: Home / Self Care | Attending: Emergency Medicine | Admitting: Emergency Medicine

## 2023-11-15 ENCOUNTER — Ambulatory Visit: Payer: Managed Care, Other (non HMO) | Admitting: Cardiovascular Disease

## 2023-11-15 DIAGNOSIS — M5020 Other cervical disc displacement, unspecified cervical region: Secondary | ICD-10-CM

## 2023-11-15 DIAGNOSIS — S32010A Wedge compression fracture of first lumbar vertebra, initial encounter for closed fracture: Secondary | ICD-10-CM

## 2023-11-15 DIAGNOSIS — I2510 Atherosclerotic heart disease of native coronary artery without angina pectoris: Secondary | ICD-10-CM | POA: Insufficient documentation

## 2023-11-15 DIAGNOSIS — S82142A Displaced bicondylar fracture of left tibia, initial encounter for closed fracture: Secondary | ICD-10-CM

## 2023-11-15 DIAGNOSIS — Z79899 Other long term (current) drug therapy: Secondary | ICD-10-CM | POA: Insufficient documentation

## 2023-11-15 DIAGNOSIS — Z7982 Long term (current) use of aspirin: Secondary | ICD-10-CM | POA: Insufficient documentation

## 2023-11-15 DIAGNOSIS — S8992XA Unspecified injury of left lower leg, initial encounter: Secondary | ICD-10-CM | POA: Insufficient documentation

## 2023-11-15 DIAGNOSIS — M545 Low back pain, unspecified: Secondary | ICD-10-CM | POA: Diagnosis not present

## 2023-11-15 DIAGNOSIS — I10 Essential (primary) hypertension: Secondary | ICD-10-CM | POA: Diagnosis not present

## 2023-11-15 DIAGNOSIS — W19XXXA Unspecified fall, initial encounter: Secondary | ICD-10-CM

## 2023-11-15 DIAGNOSIS — R519 Headache, unspecified: Secondary | ICD-10-CM | POA: Diagnosis not present

## 2023-11-15 LAB — URINALYSIS, ROUTINE W REFLEX MICROSCOPIC
Bilirubin Urine: NEGATIVE
Glucose, UA: NEGATIVE mg/dL
Hgb urine dipstick: NEGATIVE
Ketones, ur: NEGATIVE mg/dL
Leukocytes,Ua: NEGATIVE
Nitrite: NEGATIVE
Protein, ur: NEGATIVE mg/dL
Specific Gravity, Urine: >1.046 — ABNORMAL HIGH (ref 1.005–1.030)
pH: 5 (ref 5.0–8.0)

## 2023-11-15 LAB — CBC
HCT: 46.1 % (ref 39.0–52.0)
Hemoglobin: 15.2 g/dL (ref 13.0–17.0)
MCH: 32 pg (ref 26.0–34.0)
MCHC: 33 g/dL (ref 30.0–36.0)
MCV: 97.1 fL (ref 80.0–100.0)
Platelets: 190 10*3/uL (ref 150–400)
RBC: 4.75 MIL/uL (ref 4.22–5.81)
RDW: 12.7 % (ref 11.5–15.5)
WBC: 5.3 10*3/uL (ref 4.0–10.5)
nRBC: 0 % (ref 0.0–0.2)

## 2023-11-15 LAB — COMPREHENSIVE METABOLIC PANEL
ALT: 31 U/L (ref 0–44)
AST: 31 U/L (ref 15–41)
Albumin: 3.5 g/dL (ref 3.5–5.0)
Alkaline Phosphatase: 40 U/L (ref 38–126)
Anion gap: 11 (ref 5–15)
BUN: 16 mg/dL (ref 8–23)
CO2: 21 mmol/L — ABNORMAL LOW (ref 22–32)
Calcium: 8.9 mg/dL (ref 8.9–10.3)
Chloride: 107 mmol/L (ref 98–111)
Creatinine, Ser: 0.97 mg/dL (ref 0.61–1.24)
GFR, Estimated: >60 mL/min (ref >60)
Glucose, Bld: 109 mg/dL — ABNORMAL HIGH (ref 70–99)
Potassium: 3.2 mmol/L — ABNORMAL LOW (ref 3.5–5.1)
Sodium: 139 mmol/L (ref 135–145)
Total Bilirubin: 1.3 mg/dL — ABNORMAL HIGH (ref 0.0–1.2)
Total Protein: 6.7 g/dL (ref 6.5–8.1)

## 2023-11-15 LAB — I-STAT CHEM 8, ED
BUN: 17 mg/dL (ref 8–23)
Calcium, Ion: 1.04 mmol/L — ABNORMAL LOW (ref 1.15–1.40)
Chloride: 110 mmol/L (ref 98–111)
Creatinine, Ser: 0.9 mg/dL (ref 0.61–1.24)
Glucose, Bld: 104 mg/dL — ABNORMAL HIGH (ref 70–99)
HCT: 44 % (ref 39.0–52.0)
Hemoglobin: 15 g/dL (ref 13.0–17.0)
Potassium: 3.2 mmol/L — ABNORMAL LOW (ref 3.5–5.1)
Sodium: 141 mmol/L (ref 135–145)
TCO2: 21 mmol/L — ABNORMAL LOW (ref 22–32)

## 2023-11-15 LAB — I-STAT CG4 LACTIC ACID, ED: Lactic Acid, Venous: 2 mmol/L (ref 0.5–1.9)

## 2023-11-15 LAB — SAMPLE TO BLOOD BANK

## 2023-11-15 LAB — TROPONIN I (HIGH SENSITIVITY)
Troponin I (High Sensitivity): 4 ng/L (ref <18)
Troponin I (High Sensitivity): 4 ng/L (ref <18)

## 2023-11-15 LAB — ETHANOL: Alcohol, Ethyl (B): <10 mg/dL (ref <10)

## 2023-11-15 LAB — PROTIME-INR
INR: 1 (ref 0.8–1.2)
Prothrombin Time: 13 s (ref 11.4–15.2)

## 2023-11-15 MED ORDER — OXYCODONE-ACETAMINOPHEN 5-325 MG PO TABS: 1.0000 | ORAL_TABLET | Freq: Four times a day (QID) | ORAL | 0 refills | Status: DC | PRN

## 2023-11-15 MED ORDER — OXYCODONE-ACETAMINOPHEN 5-325 MG PO TABS
2.0000 | ORAL_TABLET | Freq: Once | ORAL | Status: AC
  Administered 2023-11-15: 2 via ORAL
  Filled 2023-11-15: qty 2

## 2023-11-15 MED ORDER — MORPHINE SULFATE (PF) 4 MG/ML IV SOLN
4.0000 mg | Freq: Once | INTRAVENOUS | Status: AC
  Administered 2023-11-15: 4 mg via INTRAVENOUS
  Filled 2023-11-15: qty 1

## 2023-11-15 MED ORDER — HYDROMORPHONE HCL 1 MG/ML IJ SOLN
1.0000 mg | Freq: Once | INTRAMUSCULAR | Status: AC
  Administered 2023-11-15: 1 mg via INTRAVENOUS

## 2023-11-15 MED ORDER — FENTANYL CITRATE PF 50 MCG/ML IJ SOSY
50.0000 ug | PREFILLED_SYRINGE | Freq: Once | INTRAMUSCULAR | Status: AC
  Administered 2023-11-15: 50 ug via INTRAVENOUS
  Filled 2023-11-15: qty 1

## 2023-11-15 MED ORDER — IOHEXOL 350 MG/ML SOLN
75.0000 mL | Freq: Once | INTRAVENOUS | Status: AC | PRN
  Administered 2023-11-15: 75 mL via INTRAVENOUS

## 2023-11-15 MED ORDER — HYDROMORPHONE HCL 1 MG/ML IJ SOLN
INTRAMUSCULAR | Status: AC
  Filled 2023-11-15: qty 1

## 2023-11-15 NOTE — ED Notes (Signed)
 Wife asking if could get a hold of the EMS as the patients glasses are missing. 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

## 2023-11-15 NOTE — ED Notes (Signed)
 Pt d/c home with wife  per EDP order. Discharge summary reviewed verbalize understanding

## 2023-11-15 NOTE — Discharge Instructions (Addendum)
 You were seen today after a fall.  You have a fracture of your left knee.  Please do not put any weight on your left knee.  Keep knee immobilizer in place.  Use walker for any movement to bathroom or other transfer There is a fracture of your L1 vertebrae.  Please keep the brace in place until followed up by Dr. Wynetta Emery. There is a disc protruding in your neck.  It does not appear to be causing any symptoms today.  However, if you begin having numbness, tingling, or weakness in your arms please return immediately. Call Dr. Lonie Peak office for follow-up of your back and neck Call Dr. Magdalene Patricia office for recheck of your knee
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
## 2023-11-15 NOTE — ED Provider Notes (Signed)
  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 Radiology: ordered.  Risk Prescription drug management.  Sign off received from Dr. Manus Warren 68 yo male fell 5 feet land on back and sitting.  On plavix.  Pain rlb and left knee. CTs pending. CT head CT cspine and lumbar spine Effusion left knee- xray negative.  CT of left knee with tibal plateau fx seen- page placed to Ortho Reviewed CT of neck and lumbar spine.  There is an acute disc noted on the neck that could be pressing the left anterior complete surface.  Patient does not have any neurosymptoms. L1 vertebral body with 20% loss of anterior height with no report of retropulsion Neurosurgery was also paged CT head was no evidence of acute abnormality Discussed findings with Richard Warren on for neurosurger NS note reviewed and states only needs neuro follow up in 2 weeks. Cervical collar negative.  Patient without any focal neurological symptoms. Neck is greatly improved from having  collar removed. He continues to have some low back pain.  There is no acute neurological deficit.  Orthopedic surgery, patient needs to be placed in knee immobilizer which is currently in place, be nonweightbearing and follow-up in office. LSO and walker ordered    Margarita Grizzle, MD 11/15/23 [phone]

## 2023-11-15 NOTE — ED Notes (Signed)
 Provided pt with mouth sponge to moisten mouth/lips

## 2023-11-15 NOTE — ED Triage Notes (Signed)
 Pt BIB [facility] from work. Pt was getting in his truck when he slipped falling, landing on his back. Pt c/o LLE pain and R back/flank pain. Pt does take Brulinta.

## 2023-11-15 NOTE — ED Notes (Signed)
 Pt is thirsty and asking for something to drink.  Messaged provider to see if needs to stay NPO or can have diet order to be changed.

## 2023-11-15 NOTE — ED Provider Notes (Signed)
 [place] EMERGENCY DEPARTMENT AT [facility] Provider Note   CSN: [id number] Arrival date & time: 11/15/23  8657     History  No chief complaint on file.   Richard Warren is a 68 y.o. male.  Patient with a history of CAD with stents, hypertension, GERD, hyperlipidemia presents after fall.  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  Unclear if he hit his head but denies losing consciousness.  Does take Brilinta.  Complains of pain to his right low back and left knee.  Believes his left knee twisted and feels unstable at this time.  Denies any head, neck, chest or abdominal pain.  Does have right-sided low back pain.  No focal weakness, numbness or tingling.  No bowel or bladder incontinence.  No fever or vomiting.  No history of chronic back pain problems  The history is provided by the patient.       Home Medications Prior to Admission medications   Medication Sig Start Date End Date Taking? Authorizing Provider  acidophilus (RISAQUAD) CAPS capsule Take 1 capsule by mouth daily.    [provider]  aspirin EC 81 MG tablet Take 1 tablet (81 mg total) by mouth daily. Swallow whole. 03/06/22   Arnetha Courser, MD  atorvastatin (LIPITOR) 80 MG tablet Take 1 tablet (80 mg total) by mouth every evening. 08/03/23   Laurier Nancy, MD  Coenzyme Q10 (CO Q 10 PO) Take by mouth.    [provider]  metoprolol succinate (TOPROL-XL) 25 MG 24 hr tablet Take 1 tablet (25 mg total) by mouth daily. 08/03/23   Laurier Nancy, MD  Omega-3 Fatty Acids (OMEGA 3 PO) Take 3,000 mg by mouth 2 (two) times daily.    [provider]  pantoprazole (PROTONIX) 40 MG tablet Take 40 mg by mouth daily. 06/28/23 06/27/24  [provider]  selenium 50 MCG TABS tablet Take 200 mcg by mouth daily.    [provider]  ticagrelor (BRILINTA) 60 MG TABS tablet Take 1 tablet (60 mg  total) by mouth 2 (two) times daily. 08/03/23   Laurier Nancy, MD      Allergies    Zolpidem    Review of Systems   Review of Systems  Constitutional:  Negative for activity change, appetite change and fever.  HENT:  Negative for congestion and rhinorrhea.   Respiratory:  Negative for cough, chest tightness and shortness of breath.   Cardiovascular:  Negative for chest pain.  Gastrointestinal:  Negative for abdominal pain, nausea and vomiting.  Genitourinary:  Negative for difficulty urinating, dysuria and hematuria.  Musculoskeletal:  Positive for arthralgias, back pain and myalgias.  Neurological:  Positive for headaches. Negative for dizziness and weakness.    all other systems are negative except as noted in the HPI and PMH.   Physical Exam Updated Vital Signs BP (!) 182/98   Temp 97.6 F (36.4 C) (Oral)   Ht 5\' 9"  (1.753 m)   Wt 98 kg   BMI 31.91 kg/m  Physical Exam Vitals and nursing note reviewed.  Constitutional:      General: He is not in acute distress.    Appearance: He is well-developed.  HENT:     Head: Normocephalic and atraumatic.     Mouth/Throat:     Pharynx: No oropharyngeal exudate.  Eyes:     Conjunctiva/sclera: Conjunctivae normal.  Pupils: Pupils are equal, round, and reactive to light.  Neck:     Comments: C-collar in place, no midline tenderness or step-off Cardiovascular:     Rate and Rhythm: Normal rate and regular rhythm.     Heart sounds: Normal heart sounds. No murmur heard. Pulmonary:     Effort: Pulmonary effort is normal. No respiratory distress.     Breath sounds: Normal breath sounds.  Abdominal:     Palpations: Abdomen is soft.     Tenderness: There is no abdominal tenderness. There is no guarding or rebound.  Musculoskeletal:        General: Tenderness present. Normal range of motion.     Cervical back: Normal range of motion and neck supple.     Left lower leg: Edema present.     Comments: Right paraspinal lumbar  tenderness, no midline  Small left knee effusion.  Tenderness to the left lateral joint line and pain with range of motion without deformity.  Intact DP and PT pulses bilaterally. Edema of left foot greater than right.  Skin:    General: Skin is warm.  Neurological:     Mental Status: He is alert and oriented to person, place, and time.     Cranial Nerves: No cranial nerve deficit.     Motor: No abnormal muscle tone.     Coordination: Coordination normal.     Comments:  5/5 strength throughout. CN 2-12 intact.Equal grip strength.   Psychiatric:        Behavior: Behavior normal.     ED Results / Procedures / Treatments   Labs (all labs ordered are listed, but only abnormal results are displayed) Labs Reviewed  COMPREHENSIVE METABOLIC PANEL - Abnormal; Notable for the following components:      Result Value   Potassium 3.2 (*)    CO2 21 (*)    Glucose, Bld 109 (*)    Total Bilirubin 1.3 (*)    All other components within normal limits  I-STAT CHEM 8, ED - Abnormal; Notable for the following components:   Potassium 3.2 (*)    Glucose, Bld 104 (*)    Calcium, Ion 1.04 (*)    TCO2 21 (*)    All other components within normal limits  I-STAT CG4 LACTIC ACID, ED - Abnormal; Notable for the following components:   Lactic Acid, Venous 2.0 (*)    All other components within normal limits  CBC  ETHANOL  PROTIME-INR  URINALYSIS, ROUTINE W REFLEX MICROSCOPIC  SAMPLE TO BLOOD BANK  TROPONIN I (HIGH SENSITIVITY)    EKG EKG Interpretation Date/Time:  Monday November 15 2023 05:44:46 EDT Ventricular Rate:  74 PR Interval:  157 QRS Duration:  92 QT Interval:  393 QTC Calculation: 431 R Axis:   74  Text Interpretation: Sinus rhythm Borderline low voltage, extremity leads Nonspecific T wave abnormality Confirmed by Glynn Octave [phone]) on 11/15/2023 5:51:29 AM  Radiology CT L-SPINE NO CHARGE Result Date: 11/15/2023 CLINICAL DATA:  68 year old male status post fall on blood  thinners. EXAM: CT LUMBAR SPINE WITH CONTRAST TECHNIQUE: Technique: Multiplanar CT images of the lumbar spine were reconstructed from contemporary CT of the Abdomen and Pelvis. RADIATION DOSE REDUCTION: This exam was performed according to the departmental dose-optimization program which includes automated exposure control, adjustment of the mA and/or kV according to patient size and/or use of iterative reconstruction technique. CONTRAST:  No additional COMPARISON:  CT Chest, Abdomen, and Pelvis today reported separately. FINDINGS: Segmentation: Normal on the comparison. Alignment: Relatively  maintained lumbar lordosis. Vertebrae: L1 superior endplate compression fracture with mild comminution and loss of height up to 20%. Mild anterior displacement of the superior endplate fragments. No retropulsion. L1 pedicles and posterior elements appear intact, aligned. Visible T12 vertebra appears to remain intact. Other lumbar vertebrae appear intact. Visible sacrum and SI joints appear intact. Paraspinal and other soft tissues: Abdomen and pelvis detailed separately. Negative lumbar paraspinal soft tissues. Disc levels: Degenerative changes with vacuum phenomena in the L4-L5 disc space, L5-S1 disc space, bilateral SI joints. Superimposed central or left paracentral disc protrusion at L4-L5. But no strong CT evidence of significant lumbar spinal stenosis. IMPRESSION: 1. Acute L1 superior endplate compression fracture with mild comminution and loss of height up to 20%. No retropulsion or other complicating features. 2. No other acute traumatic injury identified in the Lumbar spine. Lower lumbar disc and bilateral SI joint degeneration. 3.  CT Chest, Abdomen, and Pelvis today reported separately. Electronically Signed   By: Odessa Fleming M.D.   On: 11/15/2023 06:54   DG Chest Port 1 View Result Date: 11/15/2023 CLINICAL DATA:  Larey Seat from a truck.  Blunt polytrauma. EXAM: PORTABLE CHEST 1 VIEW COMPARISON:  PA and lateral chest  04/15/2022, abdomen and pelvis CT 12/30/2016. No prior left knee or left ankle films. FINDINGS: Chest AP portable 5:49 a.m.: There is a tangle of multiple overlying monitor wires. The lungs are expiratory. Heart appears normal in size for expiration. No vascular congestion is seen. The mediastinum is normally outlined. There is calcification of the transverse aorta. The hypoexpanded lungs appear clear. There is no measurable pneumothorax. No pleural effusion. No acute osseous findings. Thoracic spondylosis. The bones are mildly demineralized. Portable AP pelvis 1 view: There is no AP evidence of pelvic fracture or diastasis. No focal bone lesions are seen. Joint spaces are maintained at the SI joints, pubic symphysis and hips. Soft tissues are unremarkable. AP and lateral 2 views right knee: Small suprapatellar bursal effusion. Calcific plaques in the femoral, popliteal and popliteal trifurcation arteries consistent with diabetic atherosclerosis. There is no evidence of fracture, dislocation or degenerative changes. Joint spaces are normal.  Unremarkable superficial soft tissues. Right ankle, 3 views: Normal bone mineralization. No evidence of fracture, dislocation or degenerative change. No evidence of joint effusion or other focal bone abnormality. Mild soft tissue swelling IMPRESSION: 1. Expiratory chest with no evidence of acute chest disease. 2. No evidence of pelvic fracture or diastasis. 3. Small suprapatellar bursal effusion in the right knee. 4. No evidence of right ankle fracture or dislocation. Soft tissue swelling. 5. Aortic and peripheral atherosclerosis. Electronically Signed   By: Almira Bar M.D.   On: 11/15/2023 06:38   DG Pelvis Portable Result Date: 11/15/2023 CLINICAL DATA:  Larey Seat from a truck.  Blunt polytrauma. EXAM: PORTABLE CHEST 1 VIEW COMPARISON:  PA and lateral chest 04/15/2022, abdomen and pelvis CT 12/30/2016. No prior left knee or left ankle films. FINDINGS: Chest AP portable 5:49  a.m.: There is a tangle of multiple overlying monitor wires. The lungs are expiratory. Heart appears normal in size for expiration. No vascular congestion is seen. The mediastinum is normally outlined. There is calcification of the transverse aorta. The hypoexpanded lungs appear clear. There is no measurable pneumothorax. No pleural effusion. No acute osseous findings. Thoracic spondylosis. The bones are mildly demineralized. Portable AP pelvis 1 view: There is no AP evidence of pelvic fracture or diastasis. No focal bone lesions are seen. Joint spaces are maintained at the SI joints, pubic symphysis and  hips. Soft tissues are unremarkable. AP and lateral 2 views right knee: Small suprapatellar bursal effusion. Calcific plaques in the femoral, popliteal and popliteal trifurcation arteries consistent with diabetic atherosclerosis. There is no evidence of fracture, dislocation or degenerative changes. Joint spaces are normal.  Unremarkable superficial soft tissues. Right ankle, 3 views: Normal bone mineralization. No evidence of fracture, dislocation or degenerative change. No evidence of joint effusion or other focal bone abnormality. Mild soft tissue swelling IMPRESSION: 1. Expiratory chest with no evidence of acute chest disease. 2. No evidence of pelvic fracture or diastasis. 3. Small suprapatellar bursal effusion in the right knee. 4. No evidence of right ankle fracture or dislocation. Soft tissue swelling. 5. Aortic and peripheral atherosclerosis. Electronically Signed   By: Almira Bar M.D.   On: 11/15/2023 06:38   DG Ankle Left Port Result Date: 11/15/2023 CLINICAL DATA:  Larey Seat from a truck.  Blunt polytrauma. EXAM: PORTABLE CHEST 1 VIEW COMPARISON:  PA and lateral chest 04/15/2022, abdomen and pelvis CT 12/30/2016. No prior left knee or left ankle films. FINDINGS: Chest AP portable 5:49 a.m.: There is a tangle of multiple overlying monitor wires. The lungs are expiratory. Heart appears normal in size for  expiration. No vascular congestion is seen. The mediastinum is normally outlined. There is calcification of the transverse aorta. The hypoexpanded lungs appear clear. There is no measurable pneumothorax. No pleural effusion. No acute osseous findings. Thoracic spondylosis. The bones are mildly demineralized. Portable AP pelvis 1 view: There is no AP evidence of pelvic fracture or diastasis. No focal bone lesions are seen. Joint spaces are maintained at the SI joints, pubic symphysis and hips. Soft tissues are unremarkable. AP and lateral 2 views right knee: Small suprapatellar bursal effusion. Calcific plaques in the femoral, popliteal and popliteal trifurcation arteries consistent with diabetic atherosclerosis. There is no evidence of fracture, dislocation or degenerative changes. Joint spaces are normal.  Unremarkable superficial soft tissues. Right ankle, 3 views: Normal bone mineralization. No evidence of fracture, dislocation or degenerative change. No evidence of joint effusion or other focal bone abnormality. Mild soft tissue swelling IMPRESSION: 1. Expiratory chest with no evidence of acute chest disease. 2. No evidence of pelvic fracture or diastasis. 3. Small suprapatellar bursal effusion in the right knee. 4. No evidence of right ankle fracture or dislocation. Soft tissue swelling. 5. Aortic and peripheral atherosclerosis. Electronically Signed   By: Almira Bar M.D.   On: 11/15/2023 06:38   DG Knee Left Port Result Date: 11/15/2023 CLINICAL DATA:  Larey Seat from a truck.  Blunt polytrauma. EXAM: PORTABLE CHEST 1 VIEW COMPARISON:  PA and lateral chest 04/15/2022, abdomen and pelvis CT 12/30/2016. No prior left knee or left ankle films. FINDINGS: Chest AP portable 5:49 a.m.: There is a tangle of multiple overlying monitor wires. The lungs are expiratory. Heart appears normal in size for expiration. No vascular congestion is seen. The mediastinum is normally outlined. There is calcification of the  transverse aorta. The hypoexpanded lungs appear clear. There is no measurable pneumothorax. No pleural effusion. No acute osseous findings. Thoracic spondylosis. The bones are mildly demineralized. Portable AP pelvis 1 view: There is no AP evidence of pelvic fracture or diastasis. No focal bone lesions are seen. Joint spaces are maintained at the SI joints, pubic symphysis and hips. Soft tissues are unremarkable. AP and lateral 2 views right knee: Small suprapatellar bursal effusion. Calcific plaques in the femoral, popliteal and popliteal trifurcation arteries consistent with diabetic atherosclerosis. There is no evidence of fracture, dislocation or  degenerative changes. Joint spaces are normal.  Unremarkable superficial soft tissues. Right ankle, 3 views: Normal bone mineralization. No evidence of fracture, dislocation or degenerative change. No evidence of joint effusion or other focal bone abnormality. Mild soft tissue swelling IMPRESSION: 1. Expiratory chest with no evidence of acute chest disease. 2. No evidence of pelvic fracture or diastasis. 3. Small suprapatellar bursal effusion in the right knee. 4. No evidence of right ankle fracture or dislocation. Soft tissue swelling. 5. Aortic and peripheral atherosclerosis. Electronically Signed   By: Almira Bar M.D.   On: 11/15/2023 06:38    Procedures Procedures    Medications Ordered in ED Medications  HYDROmorphone (DILAUDID) injection 1 mg (has no administration in time range)    ED Course/ Medical Decision Making/ A&P                                 Medical Decision Making Amount and/or Complexity of Data Reviewed Independent Historian: EMS Labs: ordered. Decision-making details documented in ED Course. Radiology: ordered and independent interpretation performed. Decision-making details documented in ED Course. ECG/medicine tests: ordered and independent interpretation performed. Decision-making details documented in ED  Course.  Risk Prescription drug management.   Fall onto low back and injuring left knee.  Questionable head trauma.  Does take Brilinta.  Vital stable.  GCS 15, ABCs are intact.  Trauma activation on arrival due to anticoagulation use.  Complains of headache, right low back pain and left knee pain.  No weakness, numbness or tingling.  GCS is 15, ABCs are intact  Chest x-ray negative for pneumonia, pneumothorax or rib fracture.  Pelvic x-ray is stable.  Knee x-ray is negative for fracture.  Results reviewed interpreted by me.  ED imaging to be obtained given patient's Brilinta use with questionable head trauma.  Complains of pain to his low back and pain to his left knee.  CT head, C spine, L spine to be obtained given possible head injury and Low back pain.  Intact distal strength, pulses, sensation.  Low suspicion for cord compression or cauda equina.  Trauma CTs scans pending at shift change.   Care to be transferred at shift change.  Dr Rosalia Hammers to assume care.         Final Clinical Impression(s) / ED Diagnoses Final diagnoses:  Fall, initial encounter  Injury of left knee, initial encounter    Rx / DC Orders ED Discharge Orders     None         Cas Tracz, Jeannett Senior, MD 11/15/23 [phone]

## 2023-11-15 NOTE — Progress Notes (Signed)
 CSW received call from [place], [place] who states patient needs a walker delivered to bedside prior to discharge.  CSW spoke with Vaughan Basta of [employer] to have walker ordered.  Edwin Dada, MSW, LCSW Transitions of Care  Clinical Social Worker II [phone]

## 2023-11-15 NOTE — Progress Notes (Signed)
 Orthopedic Tech Progress Note Patient Details:  Richard Warren [birth year] [id number]  Ortho Devices Type of Ortho Device: Lumbar corsett Ortho Device/Splint Location: BACK Ortho Device/Splint Interventions: Ordered, Application, Adjustment   Post Interventions Patient Tolerated: Fair Instructions Provided: Care of device  Donald Pore 11/15/2023, 10:16 AM
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
## 2023-11-15 NOTE — Progress Notes (Signed)
 Orthopedic Tech Progress Note Patient Details:  Richard Warren [birth year] [id number]  Ortho Devices Type of Ortho Device: Knee Immobilizer Ortho Device/Splint Location: lle Ortho Device/Splint Interventions: Ordered, Application, Adjustment   Post Interventions Patient Tolerated: Well Instructions Provided: Care of device, Adjustment of device  Trinna Post 11/15/2023, 7:18 AM

## 2023-11-15 NOTE — Discharge Planning (Signed)
 RNCM stopped in hallway regarding rolling walker delivery.  RNCM reviewed chart and found that it was being delivered to pt at bedside prior to discharge home.  RNCM contacted DME company and rolling walker was en route.

## 2023-11-15 NOTE — ED Notes (Signed)
 Ortho tech called about back brace. They are looking into it.

## 2023-11-15 NOTE — Progress Notes (Signed)
 CT reviewed of c and L spine which shows L1 compression fracture with about 20% vertebral height loss. Recommend LSO brace when out of bed. CT c spine shows disc protrusion at C4-5 but no traumatic fractures. Reported that patient was intact. Recommend outpatient follow up in the office in 2 weeks

## 2023-11-15 NOTE — Consult Note (Signed)
 Reason for Consult:Left tibia plateau fx Referring Physician: Margarita Grizzle Time called: [phone] Time at bedside: 0935   Richard Warren is an 68 y.o. male.  HPI: Dorene Sorrow fell from the top step of his semi (~7ft). He had immediate pain in his back and left knee and could not get up. He was brought to the ED where x-rays showed a left tibia plateau fx in addition to other injuries and orthopedic surgery was consulted. He works as a Loss adjuster, chartered.  Past Medical History:  Diagnosis Date   Colon polyp    GERD (gastroesophageal reflux disease)    History of kidney stones    Hyperlipidemia    Personal history of kidney stones 04/18/2018    Past Surgical History:  Procedure Laterality Date   APPENDECTOMY  1968?   COLONOSCOPY  06/28/2009   Dr Lemar Livings   COLONOSCOPY WITH PROPOFOL N/A 06/28/2023   Procedure: COLONOSCOPY WITH PROPOFOL;  Surgeon: Regis Bill, MD;  Location: [facility] ENDOSCOPY;  Service: Endoscopy;  Laterality: N/A;   CORONARY ANGIOPLASTY     CORONARY STENT INTERVENTION N/A 03/03/2022   Procedure: CORONARY STENT INTERVENTION;  Surgeon: Yvonne Kendall, MD;  Location: [facility] INVASIVE CV LAB;  Service: Cardiovascular;  Laterality: N/A;   ESOPHAGOGASTRODUODENOSCOPY (EGD) WITH PROPOFOL N/A 06/28/2023   Procedure: ESOPHAGOGASTRODUODENOSCOPY (EGD) WITH PROPOFOL;  Surgeon: Regis Bill, MD;  Location: [facility] ENDOSCOPY;  Service: Endoscopy;  Laterality: N/A;   EXTRACORPOREAL SHOCK WAVE LITHOTRIPSY Left 01/07/2017   Procedure: EXTRACORPOREAL SHOCK WAVE LITHOTRIPSY (ESWL);  Surgeon: Hildred Laser, MD;  Location: [facility] ORS;  Service: Urology;  Laterality: Left;   HEMOSTASIS CLIP PLACEMENT  06/28/2023   Procedure: HEMOSTASIS CLIP PLACEMENT;  Surgeon: Regis Bill, MD;  Location: [facility] ENDOSCOPY;  Service: Endoscopy;;   LEFT HEART CATH AND CORONARY ANGIOGRAPHY N/A 03/03/2022   Procedure: LEFT HEART CATH AND CORONARY ANGIOGRAPHY;  Surgeon: Laurier Nancy, MD;  Location:  [facility] INVASIVE CV LAB;  Service: Cardiovascular;  Laterality: N/A;   POLYPECTOMY  06/28/2023   Procedure: POLYPECTOMY;  Surgeon: Regis Bill, MD;  Location: [facility] ENDOSCOPY;  Service: Endoscopy;;   SUBMUCOSAL LIFTING INJECTION  06/28/2023   Procedure: SUBMUCOSAL LIFTING INJECTION;  Surgeon: Regis Bill, MD;  Location: [facility] ENDOSCOPY;  Service: Endoscopy;;   SUBMUCOSAL TATTOO INJECTION  06/28/2023   Procedure: SUBMUCOSAL TATTOO INJECTION;  Surgeon: Regis Bill, MD;  Location: [facility] ENDOSCOPY;  Service: Endoscopy;;   UPPER GI ENDOSCOPY  06/28/2009   Dr Lemar Livings   VASECTOMY      Family History  Problem Relation Age of Onset   Dementia Mother    Heart attack Sister    Hypertension Brother    Coronary artery disease Sister    Prostate cancer Neg Hx    Bladder Cancer Neg Hx    Kidney cancer Neg Hx     Social History:  reports that he has never smoked. He has never used smokeless tobacco. He reports current alcohol use of about 53.0 standard drinks of alcohol per week. He reports that he does not use drugs.  Allergies:  Allergies  Allergen Reactions   Zolpidem     Depression    Medications: I have reviewed the patient's current medications.  Results for orders placed or performed during the hospital encounter of 11/15/23 (from the past 48 hours)  Sample to Blood Bank     Status: None   Collection Time: 11/15/23  5:45 AM  Result Value Ref Range   Blood Bank Specimen SAMPLE AVAILABLE FOR  TESTING    Sample Expiration      11/18/2023,2359 Performed at [facility], [address], [place], [place] [address]   Comprehensive metabolic panel     Status: Abnormal   Collection Time: 11/15/23  5:50 AM  Result Value Ref Range   Sodium 139 135 - 145 mmol/L   Potassium 3.2 (L) 3.5 - 5.1 mmol/L   Chloride 107 98 - 111 mmol/L   CO2 21 (L) 22 - 32 mmol/L   Glucose, Bld 109 (H) 70 - 99 mg/dL    Comment: Glucose reference range applies only to samples taken after  fasting for at least 8 hours.   BUN 16 8 - 23 mg/dL   Creatinine, Ser 6.04 0.61 - 1.24 mg/dL   Calcium 8.9 8.9 - 54.0 mg/dL   Total Protein 6.7 6.5 - 8.1 g/dL   Albumin 3.5 3.5 - 5.0 g/dL   AST 31 15 - 41 U/L   ALT 31 0 - 44 U/L   Alkaline Phosphatase 40 38 - 126 U/L   Total Bilirubin 1.3 (H) 0.0 - 1.2 mg/dL   GFR, Estimated >98 >11 mL/min    Comment: (NOTE) Calculated using the CKD-EPI Creatinine Equation (2021)    Anion gap 11 5 - 15    Comment: Performed at [facility], [address] [place], [place] [address]  CBC     Status: None   Collection Time: 11/15/23  5:50 AM  Result Value Ref Range   WBC 5.3 4.0 - 10.5 K/uL   RBC 4.75 4.22 - 5.81 MIL/uL   Hemoglobin 15.2 13.0 - 17.0 g/dL   HCT 29.5 62.1 - 30.8 %   MCV 97.1 80.0 - 100.0 fL   MCH 32.0 26.0 - 34.0 pg   MCHC 33.0 30.0 - 36.0 g/dL   RDW 65.7 84.6 - 96.2 %   Platelets 190 150 - 400 K/uL   nRBC 0.0 0.0 - 0.2 %    Comment: Performed at [facility], [address], [place], [place] [address]  Ethanol     Status: None   Collection Time: 11/15/23  5:50 AM  Result Value Ref Range   Alcohol, Ethyl (B) <10 <10 mg/dL    Comment: (NOTE) Lowest detectable limit for serum alcohol is 10 mg/dL.  For medical purposes only. Performed at [facility], [address], [place], [place] [address]   Protime-INR     Status: None   Collection Time: 11/15/23  5:50 AM  Result Value Ref Range   Prothrombin Time 13.0 11.4 - 15.2 seconds   INR 1.0 0.8 - 1.2    Comment: (NOTE) INR goal varies based on device and disease states. Performed at [facility], [address], [place], [place] [address]   Troponin I (High Sensitivity)     Status: None   Collection Time: 11/15/23  5:50 AM  Result Value Ref Range   Troponin I (High Sensitivity) 4 <18 ng/L    Comment: (NOTE) Elevated high sensitivity troponin I (hsTnI) values and significant  changes across serial measurements may suggest ACS but many other   chronic and acute conditions are known to elevate hsTnI results.  Refer to the "Links" section for chest pain algorithms and additional  guidance. Performed at [facility], [address], [place], [place] [address]   Urinalysis, Routine w reflex microscopic -Urine, Clean Catch     Status: Abnormal   Collection Time: 11/15/23  5:51  AM  Result Value Ref Range   Color, Urine YELLOW YELLOW   APPearance CLEAR CLEAR   Specific Gravity, Urine >1.046 (H) 1.005 - 1.030   pH 5.0 5.0 - 8.0   Glucose, UA NEGATIVE NEGATIVE mg/dL   Hgb urine dipstick NEGATIVE NEGATIVE   Bilirubin Urine NEGATIVE NEGATIVE   Ketones, ur NEGATIVE NEGATIVE mg/dL   Protein, ur NEGATIVE NEGATIVE mg/dL   Nitrite NEGATIVE NEGATIVE   Leukocytes,Ua NEGATIVE NEGATIVE    Comment: Performed at [facility], [address] [place], [place] [address]  I-Stat Chem 8, ED     Status: Abnormal   Collection Time: 11/15/23  5:56 AM  Result Value Ref Range   Sodium 141 135 - 145 mmol/L   Potassium 3.2 (L) 3.5 - 5.1 mmol/L   Chloride 110 98 - 111 mmol/L   BUN 17 8 - 23 mg/dL   Creatinine, Ser 2.95 0.61 - 1.24 mg/dL   Glucose, Bld 621 (H) 70 - 99 mg/dL    Comment: Glucose reference range applies only to samples taken after fasting for at least 8 hours.   Calcium, Ion 1.04 (L) 1.15 - 1.40 mmol/L   TCO2 21 (L) 22 - 32 mmol/L   Hemoglobin 15.0 13.0 - 17.0 g/dL   HCT 30.8 65.7 - 84.6 %  I-Stat Lactic Acid, ED     Status: Abnormal   Collection Time: 11/15/23  5:56 AM  Result Value Ref Range   Lactic Acid, Venous 2.0 (HH) 0.5 - 1.9 mmol/L    DG Wrist Complete Right Result Date: 11/15/2023 CLINICAL DATA:  Fall.  Wrist pain. EXAM: RIGHT WRIST - COMPLETE 3+ VIEW COMPARISON:  None Available. FINDINGS: There is no evidence of fracture or dislocation. There is no evidence of arthropathy or other focal bone abnormality. Soft tissues are unremarkable. IMPRESSION: Negative. Electronically Signed   By: Signa Kell M.D.   On:  11/15/2023 08:11   CT HEAD WO CONTRAST Result Date: 11/15/2023 CLINICAL DATA:  Blunt polytrauma, fall from a truck on blood thinners. EXAM: CT HEAD WITHOUT CONTRAST CT CERVICAL SPINE WITHOUT CONTRAST CT CHEST, ABDOMEN AND PELVIS WITH CONTRAST TECHNIQUE: Contiguous axial images were obtained from the base of the skull through the vertex without intravenous contrast. Multidetector CT imaging of the cervical spine was performed without intravenous contrast. Multiplanar CT image reconstructions were also generated. Multidetector CT imaging of the chest, abdomen and pelvis was performed following the standard protocol during bolus administration of intravenous contrast. RADIATION DOSE REDUCTION: This exam was performed according to the departmental dose-optimization program which includes automated exposure control, adjustment of the mA and/or kV according to patient size and/or use of iterative reconstruction technique. CONTRAST:  75mL OMNIPAQUE IOHEXOL 350 MG/ML SOLN COMPARISON:  CT abdomen pelvis without contrast 12/30/2016. No prior head CT. No prior cervical spine CT. No prior chest CT. FINDINGS: CT HEAD FINDINGS Brain: No evidence of acute infarction, hemorrhage, hydrocephalus, extra-axial collection or mass lesion/mass effect. There is mild cerebral atrophy and small-vessel disease. Vascular: There are moderate calcific plaques in both siphons. No hyperdense central vessel. Skull: There is a slightly depressed fracture of the nasal bone tip, but there is no overlying soft tissue swelling and this could be chronic. There is no scalp hematoma. No skull fractures or focal lesions are seen. Sinuses/Orbits: No acute findings. Other: None. CT CERVICAL FINDINGS Alignment: Straightened lordosis. No traumatic listhesis is seen. Narrowing and osteophytes of the anterior atlantodental joint. Skull base and vertebrae: No acute fracture. No primary bone  lesion or focal pathologic process. Soft tissues and spinal canal: No  prevertebral fluid or swelling. No visible canal hematoma. Mild calcific plaque proximal left cervical ICA. Scattered bilateral palatine tonsillar stones.  No laryngeal mass. Disc levels: Degenerative disc collapse noted C5-6 with bidirectional osteophytes. Posterior disc osteophyte complex at this level effaces the ventral CSF without cord compression. The other discs are normal in heights. There is a C4-5 left paracentral disc extrusion impressing on the left anterior cord surface. No other herniated discs or cord compromise are seen. There are mild facet joint spurring changes. At C5-6, additional uncinate hypertrophy is seen causing bilateral moderate foraminal stenosis. No other levels show foraminal stenosis. Other:  None. CT CHEST FINDINGS Cardiovascular: The cardiac size is normal. There are moderate to heavy left main and three-vessel coronary artery calcifications. There is no pericardial effusion. There is atherosclerosis in the thoracic aorta and great vessels, mild amount without evidence of aneurysm, stenosis or dissection. The pulmonary arteries are normal caliber, centrally clear. Pulmonary veins are nondistended. Mediastinum/Nodes: Small hiatal hernia. Unremarkable thyroid, normal axillary spaces. No intrathoracic adenopathy. No pneumomediastinum or fluid collections. Unremarkable thoracic trachea and main bronchi. Lungs/Pleura: Small amount of scattered ill-defined ground-glass disease in the posterior segment of the right upper lobe could be due to pulmonary contusions or unrelated pneumonitis. Asymmetric elevation right hemidiaphragm. There are small posteromedial fat herniations through both hemidiaphragms. Rest of the lungs are clear. There is no pleural effusion, thickening or pneumothorax. Musculoskeletal: No regional skeletal fracture is seen. CT ABDOMEN PELVIS FINDINGS Hepatobiliary: There are multiple homogeneous, grossly uncomplicated hepatic cysts. The largest 2 are in hepatic segment 2  measuring 3.8 cm and 3 cm, slightly larger than previously. There is no hepatic mass enhancement. No evidence of laceration or perihepatic hematoma. The gallbladder and bile ducts are unremarkable. Pancreas: No abnormality. Spleen: No abnormality.  No laceration or perisplenic hematoma. Adrenals/Urinary Tract: No adrenal or renal hemorrhage is seen. There is no mass enhancement. There is no urinary stone or obstruction. Symmetric perinephric fat stranding is similar to 2018. The bladder is unremarkable. Stomach/Bowel: No dilatation or wall thickening. An appendix is not seen in this patient. There is diffuse colonic diverticulosis without evidence of focal diverticulitis. Vascular/Lymphatic: Aortic atherosclerosis. No enlarged abdominal or pelvic lymph nodes. Reproductive: Prostate is unremarkable. Other: Small umbilical and inguinal fat hernias. No incarcerated hernia. No mesenteric contusions. There is no free hemorrhage, free air or free fluid. No abdominal wall hematoma. Musculoskeletal: Acute superior endplate anterior wedge compression fracture L1 vertebral body. There is no retropulsion. Loss of anterior vertebral height is 20% with no appreciable posterior height loss. There is a fracture fragment of the anterior superior L1 vertebral body which is slightly depressed, otherwise nondisplaced. No other regional skeletal fracture is seen. The pedicles and posterior elements are intact. IMPRESSION: 1. No acute intracranial CT findings or depressed skull fractures. 2. Straightened cervical lordosis without evidence of fractures or traumatic listhesis. 3. C4-5 left paracentral disc extrusion impressing on the left anterior cord surface. 4. C5-6 degenerative disc disease with bilateral foraminal stenosis. 5. Acute upper plate wedge compression fracture L1 vertebral body with 20% loss of anterior height and no retropulsion. 6. Small amount of scattered ill-defined ground-glass disease in the posterior segment of the  right upper lobe, could be due to pulmonary contusions or unrelated pneumonitis. Follow-up CT recommended 2-3 months to ensure clearing. 7. No other acute trauma related findings in the chest, abdomen or pelvis. 8. Aortic and coronary artery atherosclerosis. 9. Small  hiatal hernia. 10. Multiple hepatic cysts, slightly larger than previously. 11. Diverticulosis without evidence of diverticulitis. 12. Small umbilical and inguinal fat hernias. Aortic Atherosclerosis (ICD10-I70.0). Electronically Signed   By: Almira Bar M.D.   On: 11/15/2023 07:19   CT CERVICAL SPINE WO CONTRAST Result Date: 11/15/2023 CLINICAL DATA:  Blunt polytrauma, fall from a truck on blood thinners. EXAM: CT HEAD WITHOUT CONTRAST CT CERVICAL SPINE WITHOUT CONTRAST CT CHEST, ABDOMEN AND PELVIS WITH CONTRAST TECHNIQUE: Contiguous axial images were obtained from the base of the skull through the vertex without intravenous contrast. Multidetector CT imaging of the cervical spine was performed without intravenous contrast. Multiplanar CT image reconstructions were also generated. Multidetector CT imaging of the chest, abdomen and pelvis was performed following the standard protocol during bolus administration of intravenous contrast. RADIATION DOSE REDUCTION: This exam was performed according to the departmental dose-optimization program which includes automated exposure control, adjustment of the mA and/or kV according to patient size and/or use of iterative reconstruction technique. CONTRAST:  75mL OMNIPAQUE IOHEXOL 350 MG/ML SOLN COMPARISON:  CT abdomen pelvis without contrast 12/30/2016. No prior head CT. No prior cervical spine CT. No prior chest CT. FINDINGS: CT HEAD FINDINGS Brain: No evidence of acute infarction, hemorrhage, hydrocephalus, extra-axial collection or mass lesion/mass effect. There is mild cerebral atrophy and small-vessel disease. Vascular: There are moderate calcific plaques in both siphons. No hyperdense central vessel.  Skull: There is a slightly depressed fracture of the nasal bone tip, but there is no overlying soft tissue swelling and this could be chronic. There is no scalp hematoma. No skull fractures or focal lesions are seen. Sinuses/Orbits: No acute findings. Other: None. CT CERVICAL FINDINGS Alignment: Straightened lordosis. No traumatic listhesis is seen. Narrowing and osteophytes of the anterior atlantodental joint. Skull base and vertebrae: No acute fracture. No primary bone lesion or focal pathologic process. Soft tissues and spinal canal: No prevertebral fluid or swelling. No visible canal hematoma. Mild calcific plaque proximal left cervical ICA. Scattered bilateral palatine tonsillar stones.  No laryngeal mass. Disc levels: Degenerative disc collapse noted C5-6 with bidirectional osteophytes. Posterior disc osteophyte complex at this level effaces the ventral CSF without cord compression. The other discs are normal in heights. There is a C4-5 left paracentral disc extrusion impressing on the left anterior cord surface. No other herniated discs or cord compromise are seen. There are mild facet joint spurring changes. At C5-6, additional uncinate hypertrophy is seen causing bilateral moderate foraminal stenosis. No other levels show foraminal stenosis. Other:  None. CT CHEST FINDINGS Cardiovascular: The cardiac size is normal. There are moderate to heavy left main and three-vessel coronary artery calcifications. There is no pericardial effusion. There is atherosclerosis in the thoracic aorta and great vessels, mild amount without evidence of aneurysm, stenosis or dissection. The pulmonary arteries are normal caliber, centrally clear. Pulmonary veins are nondistended. Mediastinum/Nodes: Small hiatal hernia. Unremarkable thyroid, normal axillary spaces. No intrathoracic adenopathy. No pneumomediastinum or fluid collections. Unremarkable thoracic trachea and main bronchi. Lungs/Pleura: Small amount of scattered  ill-defined ground-glass disease in the posterior segment of the right upper lobe could be due to pulmonary contusions or unrelated pneumonitis. Asymmetric elevation right hemidiaphragm. There are small posteromedial fat herniations through both hemidiaphragms. Rest of the lungs are clear. There is no pleural effusion, thickening or pneumothorax. Musculoskeletal: No regional skeletal fracture is seen. CT ABDOMEN PELVIS FINDINGS Hepatobiliary: There are multiple homogeneous, grossly uncomplicated hepatic cysts. The largest 2 are in hepatic segment 2 measuring 3.8 cm and 3 cm, slightly  larger than previously. There is no hepatic mass enhancement. No evidence of laceration or perihepatic hematoma. The gallbladder and bile ducts are unremarkable. Pancreas: No abnormality. Spleen: No abnormality.  No laceration or perisplenic hematoma. Adrenals/Urinary Tract: No adrenal or renal hemorrhage is seen. There is no mass enhancement. There is no urinary stone or obstruction. Symmetric perinephric fat stranding is similar to 2018. The bladder is unremarkable. Stomach/Bowel: No dilatation or wall thickening. An appendix is not seen in this patient. There is diffuse colonic diverticulosis without evidence of focal diverticulitis. Vascular/Lymphatic: Aortic atherosclerosis. No enlarged abdominal or pelvic lymph nodes. Reproductive: Prostate is unremarkable. Other: Small umbilical and inguinal fat hernias. No incarcerated hernia. No mesenteric contusions. There is no free hemorrhage, free air or free fluid. No abdominal wall hematoma. Musculoskeletal: Acute superior endplate anterior wedge compression fracture L1 vertebral body. There is no retropulsion. Loss of anterior vertebral height is 20% with no appreciable posterior height loss. There is a fracture fragment of the anterior superior L1 vertebral body which is slightly depressed, otherwise nondisplaced. No other regional skeletal fracture is seen. The pedicles and posterior  elements are intact. IMPRESSION: 1. No acute intracranial CT findings or depressed skull fractures. 2. Straightened cervical lordosis without evidence of fractures or traumatic listhesis. 3. C4-5 left paracentral disc extrusion impressing on the left anterior cord surface. 4. C5-6 degenerative disc disease with bilateral foraminal stenosis. 5. Acute upper plate wedge compression fracture L1 vertebral body with 20% loss of anterior height and no retropulsion. 6. Small amount of scattered ill-defined ground-glass disease in the posterior segment of the right upper lobe, could be due to pulmonary contusions or unrelated pneumonitis. Follow-up CT recommended 2-3 months to ensure clearing. 7. No other acute trauma related findings in the chest, abdomen or pelvis. 8. Aortic and coronary artery atherosclerosis. 9. Small hiatal hernia. 10. Multiple hepatic cysts, slightly larger than previously. 11. Diverticulosis without evidence of diverticulitis. 12. Small umbilical and inguinal fat hernias. Aortic Atherosclerosis (ICD10-I70.0). Electronically Signed   By: Almira Bar M.D.   On: 11/15/2023 07:19   CT CHEST ABDOMEN PELVIS W CONTRAST Result Date: 11/15/2023 CLINICAL DATA:  Blunt polytrauma, fall from a truck on blood thinners. EXAM: CT HEAD WITHOUT CONTRAST CT CERVICAL SPINE WITHOUT CONTRAST CT CHEST, ABDOMEN AND PELVIS WITH CONTRAST TECHNIQUE: Contiguous axial images were obtained from the base of the skull through the vertex without intravenous contrast. Multidetector CT imaging of the cervical spine was performed without intravenous contrast. Multiplanar CT image reconstructions were also generated. Multidetector CT imaging of the chest, abdomen and pelvis was performed following the standard protocol during bolus administration of intravenous contrast. RADIATION DOSE REDUCTION: This exam was performed according to the departmental dose-optimization program which includes automated exposure control, adjustment of  the mA and/or kV according to patient size and/or use of iterative reconstruction technique. CONTRAST:  75mL OMNIPAQUE IOHEXOL 350 MG/ML SOLN COMPARISON:  CT abdomen pelvis without contrast 12/30/2016. No prior head CT. No prior cervical spine CT. No prior chest CT. FINDINGS: CT HEAD FINDINGS Brain: No evidence of acute infarction, hemorrhage, hydrocephalus, extra-axial collection or mass lesion/mass effect. There is mild cerebral atrophy and small-vessel disease. Vascular: There are moderate calcific plaques in both siphons. No hyperdense central vessel. Skull: There is a slightly depressed fracture of the nasal bone tip, but there is no overlying soft tissue swelling and this could be chronic. There is no scalp hematoma. No skull fractures or focal lesions are seen. Sinuses/Orbits: No acute findings. Other: None. CT CERVICAL FINDINGS  Alignment: Straightened lordosis. No traumatic listhesis is seen. Narrowing and osteophytes of the anterior atlantodental joint. Skull base and vertebrae: No acute fracture. No primary bone lesion or focal pathologic process. Soft tissues and spinal canal: No prevertebral fluid or swelling. No visible canal hematoma. Mild calcific plaque proximal left cervical ICA. Scattered bilateral palatine tonsillar stones.  No laryngeal mass. Disc levels: Degenerative disc collapse noted C5-6 with bidirectional osteophytes. Posterior disc osteophyte complex at this level effaces the ventral CSF without cord compression. The other discs are normal in heights. There is a C4-5 left paracentral disc extrusion impressing on the left anterior cord surface. No other herniated discs or cord compromise are seen. There are mild facet joint spurring changes. At C5-6, additional uncinate hypertrophy is seen causing bilateral moderate foraminal stenosis. No other levels show foraminal stenosis. Other:  None. CT CHEST FINDINGS Cardiovascular: The cardiac size is normal. There are moderate to heavy left main  and three-vessel coronary artery calcifications. There is no pericardial effusion. There is atherosclerosis in the thoracic aorta and great vessels, mild amount without evidence of aneurysm, stenosis or dissection. The pulmonary arteries are normal caliber, centrally clear. Pulmonary veins are nondistended. Mediastinum/Nodes: Small hiatal hernia. Unremarkable thyroid, normal axillary spaces. No intrathoracic adenopathy. No pneumomediastinum or fluid collections. Unremarkable thoracic trachea and main bronchi. Lungs/Pleura: Small amount of scattered ill-defined ground-glass disease in the posterior segment of the right upper lobe could be due to pulmonary contusions or unrelated pneumonitis. Asymmetric elevation right hemidiaphragm. There are small posteromedial fat herniations through both hemidiaphragms. Rest of the lungs are clear. There is no pleural effusion, thickening or pneumothorax. Musculoskeletal: No regional skeletal fracture is seen. CT ABDOMEN PELVIS FINDINGS Hepatobiliary: There are multiple homogeneous, grossly uncomplicated hepatic cysts. The largest 2 are in hepatic segment 2 measuring 3.8 cm and 3 cm, slightly larger than previously. There is no hepatic mass enhancement. No evidence of laceration or perihepatic hematoma. The gallbladder and bile ducts are unremarkable. Pancreas: No abnormality. Spleen: No abnormality.  No laceration or perisplenic hematoma. Adrenals/Urinary Tract: No adrenal or renal hemorrhage is seen. There is no mass enhancement. There is no urinary stone or obstruction. Symmetric perinephric fat stranding is similar to 2018. The bladder is unremarkable. Stomach/Bowel: No dilatation or wall thickening. An appendix is not seen in this patient. There is diffuse colonic diverticulosis without evidence of focal diverticulitis. Vascular/Lymphatic: Aortic atherosclerosis. No enlarged abdominal or pelvic lymph nodes. Reproductive: Prostate is unremarkable. Other: Small umbilical and  inguinal fat hernias. No incarcerated hernia. No mesenteric contusions. There is no free hemorrhage, free air or free fluid. No abdominal wall hematoma. Musculoskeletal: Acute superior endplate anterior wedge compression fracture L1 vertebral body. There is no retropulsion. Loss of anterior vertebral height is 20% with no appreciable posterior height loss. There is a fracture fragment of the anterior superior L1 vertebral body which is slightly depressed, otherwise nondisplaced. No other regional skeletal fracture is seen. The pedicles and posterior elements are intact. IMPRESSION: 1. No acute intracranial CT findings or depressed skull fractures. 2. Straightened cervical lordosis without evidence of fractures or traumatic listhesis. 3. C4-5 left paracentral disc extrusion impressing on the left anterior cord surface. 4. C5-6 degenerative disc disease with bilateral foraminal stenosis. 5. Acute upper plate wedge compression fracture L1 vertebral body with 20% loss of anterior height and no retropulsion. 6. Small amount of scattered ill-defined ground-glass disease in the posterior segment of the right upper lobe, could be due to pulmonary contusions or unrelated pneumonitis. Follow-up CT recommended 2-3  months to ensure clearing. 7. No other acute trauma related findings in the chest, abdomen or pelvis. 8. Aortic and coronary artery atherosclerosis. 9. Small hiatal hernia. 10. Multiple hepatic cysts, slightly larger than previously. 11. Diverticulosis without evidence of diverticulitis. 12. Small umbilical and inguinal fat hernias. Aortic Atherosclerosis (ICD10-I70.0). Electronically Signed   By: Almira Bar M.D.   On: 11/15/2023 07:19   CT Knee Left Wo Contrast Result Date: 11/15/2023 CLINICAL DATA:  Evaluate for fracture.  Trauma. EXAM: CT OF THE LEFT KNEE WITHOUT CONTRAST TECHNIQUE: Multidetector CT imaging of the left knee was performed according to the standard protocol. Multiplanar CT image  reconstructions were also generated. RADIATION DOSE REDUCTION: This exam was performed according to the departmental dose-optimization program which includes automated exposure control, adjustment of the mA and/or kV according to patient size and/or use of iterative reconstruction technique. COMPARISON:  None Available. FINDINGS: Bones/Joint/Cartilage Small to moderate suprapatellar joint effusion measures 45 Hounsfield units. Nondisplaced, intra-articular comminuted fracture involving the proximal fibula is identified, image 65/7. Mild depressed fracture involving the posterior aspect of the medial tibial plateau is also suspected, image 65/7. No additional fractures identified. Ligaments Suboptimally assessed by CT. Muscles and Tendons No intramuscular hematoma. PCL appears intact. Cannot exclude injury to the ACL. Soft tissues Unremarkable. IMPRESSION: 1. Nondisplaced, intra-articular comminuted fracture involving the proximal fibula. 2. Mild depressed fracture involving the posterior aspect of the medial tibial plateau is also suspected. 3. Small to moderate suprapatellar joint effusion. 4. Cannot exclude injury to the ACL. Consider further evaluation with MRI of the left knee. Electronically Signed   By: Signa Kell M.D.   On: 11/15/2023 07:11   CT L-SPINE NO CHARGE Result Date: 11/15/2023 CLINICAL DATA:  68 year old male status post fall on blood thinners. EXAM: CT LUMBAR SPINE WITH CONTRAST TECHNIQUE: Technique: Multiplanar CT images of the lumbar spine were reconstructed from contemporary CT of the Abdomen and Pelvis. RADIATION DOSE REDUCTION: This exam was performed according to the departmental dose-optimization program which includes automated exposure control, adjustment of the mA and/or kV according to patient size and/or use of iterative reconstruction technique. CONTRAST:  No additional COMPARISON:  CT Chest, Abdomen, and Pelvis today reported separately. FINDINGS: Segmentation: Normal on the  comparison. Alignment: Relatively maintained lumbar lordosis. Vertebrae: L1 superior endplate compression fracture with mild comminution and loss of height up to 20%. Mild anterior displacement of the superior endplate fragments. No retropulsion. L1 pedicles and posterior elements appear intact, aligned. Visible T12 vertebra appears to remain intact. Other lumbar vertebrae appear intact. Visible sacrum and SI joints appear intact. Paraspinal and other soft tissues: Abdomen and pelvis detailed separately. Negative lumbar paraspinal soft tissues. Disc levels: Degenerative changes with vacuum phenomena in the L4-L5 disc space, L5-S1 disc space, bilateral SI joints. Superimposed central or left paracentral disc protrusion at L4-L5. But no strong CT evidence of significant lumbar spinal stenosis. IMPRESSION: 1. Acute L1 superior endplate compression fracture with mild comminution and loss of height up to 20%. No retropulsion or other complicating features. 2. No other acute traumatic injury identified in the Lumbar spine. Lower lumbar disc and bilateral SI joint degeneration. 3.  CT Chest, Abdomen, and Pelvis today reported separately. Electronically Signed   By: Odessa Fleming M.D.   On: 11/15/2023 06:54   DG Chest Port 1 View Result Date: 11/15/2023 CLINICAL DATA:  Larey Seat from a truck.  Blunt polytrauma. EXAM: PORTABLE CHEST 1 VIEW COMPARISON:  PA and lateral chest 04/15/2022, abdomen and pelvis CT 12/30/2016. No prior left  knee or left ankle films. FINDINGS: Chest AP portable 5:49 a.m.: There is a tangle of multiple overlying monitor wires. The lungs are expiratory. Heart appears normal in size for expiration. No vascular congestion is seen. The mediastinum is normally outlined. There is calcification of the transverse aorta. The hypoexpanded lungs appear clear. There is no measurable pneumothorax. No pleural effusion. No acute osseous findings. Thoracic spondylosis. The bones are mildly demineralized. Portable AP pelvis 1  view: There is no AP evidence of pelvic fracture or diastasis. No focal bone lesions are seen. Joint spaces are maintained at the SI joints, pubic symphysis and hips. Soft tissues are unremarkable. AP and lateral 2 views right knee: Small suprapatellar bursal effusion. Calcific plaques in the femoral, popliteal and popliteal trifurcation arteries consistent with diabetic atherosclerosis. There is no evidence of fracture, dislocation or degenerative changes. Joint spaces are normal.  Unremarkable superficial soft tissues. Right ankle, 3 views: Normal bone mineralization. No evidence of fracture, dislocation or degenerative change. No evidence of joint effusion or other focal bone abnormality. Mild soft tissue swelling IMPRESSION: 1. Expiratory chest with no evidence of acute chest disease. 2. No evidence of pelvic fracture or diastasis. 3. Small suprapatellar bursal effusion in the right knee. 4. No evidence of right ankle fracture or dislocation. Soft tissue swelling. 5. Aortic and peripheral atherosclerosis. Electronically Signed   By: Almira Bar M.D.   On: 11/15/2023 06:38   DG Pelvis Portable Result Date: 11/15/2023 CLINICAL DATA:  Larey Seat from a truck.  Blunt polytrauma. EXAM: PORTABLE CHEST 1 VIEW COMPARISON:  PA and lateral chest 04/15/2022, abdomen and pelvis CT 12/30/2016. No prior left knee or left ankle films. FINDINGS: Chest AP portable 5:49 a.m.: There is a tangle of multiple overlying monitor wires. The lungs are expiratory. Heart appears normal in size for expiration. No vascular congestion is seen. The mediastinum is normally outlined. There is calcification of the transverse aorta. The hypoexpanded lungs appear clear. There is no measurable pneumothorax. No pleural effusion. No acute osseous findings. Thoracic spondylosis. The bones are mildly demineralized. Portable AP pelvis 1 view: There is no AP evidence of pelvic fracture or diastasis. No focal bone lesions are seen. Joint spaces are  maintained at the SI joints, pubic symphysis and hips. Soft tissues are unremarkable. AP and lateral 2 views right knee: Small suprapatellar bursal effusion. Calcific plaques in the femoral, popliteal and popliteal trifurcation arteries consistent with diabetic atherosclerosis. There is no evidence of fracture, dislocation or degenerative changes. Joint spaces are normal.  Unremarkable superficial soft tissues. Right ankle, 3 views: Normal bone mineralization. No evidence of fracture, dislocation or degenerative change. No evidence of joint effusion or other focal bone abnormality. Mild soft tissue swelling IMPRESSION: 1. Expiratory chest with no evidence of acute chest disease. 2. No evidence of pelvic fracture or diastasis. 3. Small suprapatellar bursal effusion in the right knee. 4. No evidence of right ankle fracture or dislocation. Soft tissue swelling. 5. Aortic and peripheral atherosclerosis. Electronically Signed   By: Almira Bar M.D.   On: 11/15/2023 06:38   DG Ankle Left Port Result Date: 11/15/2023 CLINICAL DATA:  Larey Seat from a truck.  Blunt polytrauma. EXAM: PORTABLE CHEST 1 VIEW COMPARISON:  PA and lateral chest 04/15/2022, abdomen and pelvis CT 12/30/2016. No prior left knee or left ankle films. FINDINGS: Chest AP portable 5:49 a.m.: There is a tangle of multiple overlying monitor wires. The lungs are expiratory. Heart appears normal in size for expiration. No vascular congestion is seen. The mediastinum is  normally outlined. There is calcification of the transverse aorta. The hypoexpanded lungs appear clear. There is no measurable pneumothorax. No pleural effusion. No acute osseous findings. Thoracic spondylosis. The bones are mildly demineralized. Portable AP pelvis 1 view: There is no AP evidence of pelvic fracture or diastasis. No focal bone lesions are seen. Joint spaces are maintained at the SI joints, pubic symphysis and hips. Soft tissues are unremarkable. AP and lateral 2 views right  knee: Small suprapatellar bursal effusion. Calcific plaques in the femoral, popliteal and popliteal trifurcation arteries consistent with diabetic atherosclerosis. There is no evidence of fracture, dislocation or degenerative changes. Joint spaces are normal.  Unremarkable superficial soft tissues. Right ankle, 3 views: Normal bone mineralization. No evidence of fracture, dislocation or degenerative change. No evidence of joint effusion or other focal bone abnormality. Mild soft tissue swelling IMPRESSION: 1. Expiratory chest with no evidence of acute chest disease. 2. No evidence of pelvic fracture or diastasis. 3. Small suprapatellar bursal effusion in the right knee. 4. No evidence of right ankle fracture or dislocation. Soft tissue swelling. 5. Aortic and peripheral atherosclerosis. Electronically Signed   By: Almira Bar M.D.   On: 11/15/2023 06:38   DG Knee Left Port Result Date: 11/15/2023 CLINICAL DATA:  Larey Seat from a truck.  Blunt polytrauma. EXAM: PORTABLE CHEST 1 VIEW COMPARISON:  PA and lateral chest 04/15/2022, abdomen and pelvis CT 12/30/2016. No prior left knee or left ankle films. FINDINGS: Chest AP portable 5:49 a.m.: There is a tangle of multiple overlying monitor wires. The lungs are expiratory. Heart appears normal in size for expiration. No vascular congestion is seen. The mediastinum is normally outlined. There is calcification of the transverse aorta. The hypoexpanded lungs appear clear. There is no measurable pneumothorax. No pleural effusion. No acute osseous findings. Thoracic spondylosis. The bones are mildly demineralized. Portable AP pelvis 1 view: There is no AP evidence of pelvic fracture or diastasis. No focal bone lesions are seen. Joint spaces are maintained at the SI joints, pubic symphysis and hips. Soft tissues are unremarkable. AP and lateral 2 views right knee: Small suprapatellar bursal effusion. Calcific plaques in the femoral, popliteal and popliteal trifurcation  arteries consistent with diabetic atherosclerosis. There is no evidence of fracture, dislocation or degenerative changes. Joint spaces are normal.  Unremarkable superficial soft tissues. Right ankle, 3 views: Normal bone mineralization. No evidence of fracture, dislocation or degenerative change. No evidence of joint effusion or other focal bone abnormality. Mild soft tissue swelling IMPRESSION: 1. Expiratory chest with no evidence of acute chest disease. 2. No evidence of pelvic fracture or diastasis. 3. Small suprapatellar bursal effusion in the right knee. 4. No evidence of right ankle fracture or dislocation. Soft tissue swelling. 5. Aortic and peripheral atherosclerosis. Electronically Signed   By: Almira Bar M.D.   On: 11/15/2023 06:38    Review of Systems  HENT:  Negative for ear discharge, ear pain, hearing loss and tinnitus.   Eyes:  Negative for photophobia and pain.  Respiratory:  Negative for cough and shortness of breath.   Cardiovascular:  Negative for chest pain.  Gastrointestinal:  Negative for abdominal pain, nausea and vomiting.  Genitourinary:  Negative for dysuria, flank pain, frequency and urgency.  Musculoskeletal:  Positive for arthralgias (Left knee, right wrist) and back pain. Negative for myalgias and neck pain.  Neurological:  Negative for dizziness and headaches.  Hematological:  Does not bruise/bleed easily.  Psychiatric/Behavioral:  The patient is not nervous/anxious.    Blood pressure (!) 137/91,  pulse 78, temperature 97.6 F (36.4 C), temperature source Oral, resp. rate (!) 21, height 5\' 9"  (1.753 m), weight 98 kg, SpO2 96%. Physical Exam Constitutional:      General: He is not in acute distress.    Appearance: He is well-developed. He is not diaphoretic.  HENT:     Head: Normocephalic and atraumatic.  Eyes:     General: No scleral icterus.       Right eye: No discharge.        Left eye: No discharge.     Conjunctiva/sclera: Conjunctivae normal.   Cardiovascular:     Rate and Rhythm: Normal rate and regular rhythm.  Pulmonary:     Effort: Pulmonary effort is normal. No respiratory distress.  Musculoskeletal:     Cervical back: Normal range of motion.     Comments: LLE No traumatic wounds, ecchymosis, or rash  KI in place  No ankle effusion  Sens DPN, SPN, TN intact  Motor EHL, ext, flex, evers 5/5  DP 2+, PT 2+, No significant edema  Skin:    General: Skin is warm and dry.  Neurological:     Mental Status: He is alert.  Psychiatric:        Mood and Affect: Mood normal.        Behavior: Behavior normal.     Assessment/Plan: Left tibia plateau fx -- Plan initial non-operative treatment with [facility] in [place]. F/u with Dr. Carola Frost next week.    Freeman Caldron, PA-C Orthopedic Surgery [phone] 11/15/2023, 9:41 AM

## 2023-11-23 ENCOUNTER — Encounter: Payer: Self-pay | Admitting: Cardiovascular Disease

## 2023-11-23 ENCOUNTER — Ambulatory Visit: Admitting: Cardiovascular Disease

## 2023-11-23 VITALS — BP 132/78 | HR 73 | Ht 69.0 in | Wt 212.0 lb

## 2023-11-23 DIAGNOSIS — I10 Essential (primary) hypertension: Secondary | ICD-10-CM | POA: Diagnosis not present

## 2023-11-23 DIAGNOSIS — E7800 Pure hypercholesterolemia, unspecified: Secondary | ICD-10-CM | POA: Diagnosis not present

## 2023-11-23 DIAGNOSIS — I2510 Atherosclerotic heart disease of native coronary artery without angina pectoris: Secondary | ICD-10-CM

## 2023-11-23 DIAGNOSIS — I249 Acute ischemic heart disease, unspecified: Secondary | ICD-10-CM | POA: Diagnosis not present

## 2023-11-23 DIAGNOSIS — Z9861 Coronary angioplasty status: Secondary | ICD-10-CM

## 2023-11-23 DIAGNOSIS — Z01810 Encounter for preprocedural cardiovascular examination: Secondary | ICD-10-CM

## 2023-11-23 DIAGNOSIS — R0602 Shortness of breath: Secondary | ICD-10-CM

## 2023-11-23 MED ORDER — TICAGRELOR 60 MG PO TABS: 60.0000 mg | ORAL_TABLET | Freq: Two times a day (BID) | ORAL | 2 refills | Status: DC

## 2023-11-23 MED ORDER — METOPROLOL SUCCINATE ER 25 MG PO TB24: 25.0000 mg | ORAL_TABLET | Freq: Every day | ORAL | 2 refills | Status: DC

## 2023-11-23 MED ORDER — ATORVASTATIN CALCIUM 80 MG PO TABS
80.0000 mg | ORAL_TABLET | Freq: Every evening | ORAL | 1 refills | Status: AC
Start: 2023-11-23 — End: ?

## 2023-11-23 NOTE — Progress Notes (Addendum)
 Cardiology Office Note   Date:  11/23/2023   ID:  Richard Warren, DOB 07/13/1956, MRN [id number]  PCP:  Ronnald Ramp, MD  Cardiologist:  Adrian Blackwater, MD      History of Present Illness: Richard Warren is a 68 y.o. male who presents for No chief complaint on file.   67 YOWM, had fell from truck  with Knee and wrist fracture. No diiziness or syncope, just tripped. Has SOB but no chest pain, as had PCI/stenting in past.     Past Medical History:  Diagnosis Date  . Colon polyp   . GERD (gastroesophageal reflux disease)   . History of kidney stones   . Hyperlipidemia   . Personal history of kidney stones 04/18/2018     Past Surgical History:  Procedure Laterality Date  . APPENDECTOMY  1968?  . COLONOSCOPY  06/28/2009   Dr Lemar Livings  . COLONOSCOPY WITH PROPOFOL N/A 06/28/2023   Procedure: COLONOSCOPY WITH PROPOFOL;  Surgeon: Regis Bill, MD;  Location: [facility];  Service: Endoscopy;  Laterality: N/A;  . CORONARY ANGIOPLASTY    . CORONARY STENT INTERVENTION N/A 03/03/2022   Procedure: CORONARY STENT INTERVENTION;  Surgeon: Yvonne Kendall, MD;  Location: [facility];  Service: Cardiovascular;  Laterality: N/A;  . ESOPHAGOGASTRODUODENOSCOPY (EGD) WITH PROPOFOL N/A 06/28/2023   Procedure: ESOPHAGOGASTRODUODENOSCOPY (EGD) WITH PROPOFOL;  Surgeon: Regis Bill, MD;  Location: [facility];  Service: Endoscopy;  Laterality: N/A;  . EXTRACORPOREAL SHOCK WAVE LITHOTRIPSY Left 01/07/2017   Procedure: EXTRACORPOREAL SHOCK WAVE LITHOTRIPSY (ESWL);  Surgeon: Hildred Laser, MD;  Location: [facility];  Service: Urology;  Laterality: Left;  . HEMOSTASIS CLIP PLACEMENT  06/28/2023   Procedure: HEMOSTASIS CLIP PLACEMENT;  Surgeon: Regis Bill, MD;  Location: [facility];  Service: Endoscopy;;  . LEFT HEART CATH AND CORONARY ANGIOGRAPHY N/A 03/03/2022   Procedure: LEFT HEART CATH AND CORONARY ANGIOGRAPHY;  Surgeon: Laurier Nancy, MD;  Location: [facility];  Service: Cardiovascular;  Laterality: N/A;  . POLYPECTOMY  06/28/2023   Procedure: POLYPECTOMY;  Surgeon: Regis Bill, MD;  Location: [facility];  Service: Endoscopy;;  . SUBMUCOSAL LIFTING INJECTION  06/28/2023   Procedure: SUBMUCOSAL LIFTING INJECTION;  Surgeon: Regis Bill, MD;  Location: [facility];  Service: Endoscopy;;  . SUBMUCOSAL TATTOO INJECTION  06/28/2023   Procedure: SUBMUCOSAL TATTOO INJECTION;  Surgeon: Regis Bill, MD;  Location: [facility];  Service: Endoscopy;;  . UPPER GI ENDOSCOPY  06/28/2009   Dr Lemar Livings  . VASECTOMY       Current Outpatient Medications  Medication Sig Dispense Refill  . Azelaic Acid 15 % gel Apply 1 Application topically daily.    . cyclobenzaprine (FLEXERIL) 5 MG tablet Take 5 mg by mouth 3 (three) times daily.    Marland Kitchen acidophilus (RISAQUAD) CAPS capsule Take 1 capsule by mouth daily.    Marland Kitchen aspirin EC 81 MG tablet Take 1 tablet (81 mg total) by mouth daily. Swallow whole. 30 tablet 12  . atorvastatin (LIPITOR) 80 MG tablet Take 1 tablet (80 mg total) by mouth every evening. 90 tablet 1  . Coenzyme Q10 (CO Q 10 PO) Take by mouth.    . metoprolol succinate (TOPROL-XL) 25 MG 24 hr tablet Take 1 tablet (25 mg total) by mouth daily. 30 tablet 2  . Omega-3 Fatty Acids (OMEGA 3 PO) Take 3,000 mg by mouth 2 (two) times daily.    Marland Kitchen oxyCODONE-acetaminophen (PERCOCET/ROXICET) 5-325 MG tablet Take 1 tablet  by mouth every 6 (six) hours as needed for severe pain (pain score 7-10). 15 tablet 0  . pantoprazole (PROTONIX) 40 MG tablet Take 40 mg by mouth daily.    Marland Kitchen selenium 50 MCG TABS tablet Take 200 mcg by mouth daily.    . ticagrelor (BRILINTA) 60 MG TABS tablet Take 1 tablet (60 mg total) by mouth 2 (two) times daily. 60 tablet 2   No current facility-administered medications for this visit.    Allergies:   Zolpidem    Social History:   reports that he has never smoked. He  has never used smokeless tobacco. He reports current alcohol use of about 53.0 standard drinks of alcohol per week. He reports that he does not use drugs.   Family History:  family history includes Coronary artery disease in his sister; Dementia in his mother; Heart attack in his sister; Hypertension in his brother.    ROS:     Review of Systems  Constitutional: Negative.   HENT: Negative.    Eyes: Negative.   Respiratory: Negative.    Gastrointestinal: Negative.   Genitourinary: Negative.   Musculoskeletal: Negative.   Skin: Negative.   Neurological: Negative.   Endo/Heme/Allergies: Negative.   Psychiatric/Behavioral: Negative.    All other systems reviewed and are negative.     All other systems are reviewed and negative.    PHYSICAL EXAM: VS:  BP 132/78   Pulse 73   Ht 5\' 9"  (1.753 m)   Wt 212 lb (96.2 kg)   SpO2 98%   BMI 31.31 kg/m  , BMI Body mass index is 31.31 kg/m. Last weight:  Wt Readings from Last 3 Encounters:  11/23/23 212 lb (96.2 kg)  11/15/23 216 lb 0.8 oz (98 kg)  10/18/23 216 lb (98 kg)     Physical Exam Vitals reviewed.  Constitutional:      Appearance: Normal appearance. He is normal weight.  HENT:     Head: Normocephalic.     Nose: Nose normal.     Mouth/Throat:     Mouth: Mucous membranes are moist.  Eyes:     Pupils: Pupils are equal, round, and reactive to light.  Cardiovascular:     Rate and Rhythm: Normal rate and regular rhythm.     Pulses: Normal pulses.     Heart sounds: Normal heart sounds.  Pulmonary:     Effort: Pulmonary effort is normal.  Abdominal:     General: Abdomen is flat. Bowel sounds are normal.  Musculoskeletal:        General: Normal range of motion.     Cervical back: Normal range of motion.  Skin:    General: Skin is warm.  Neurological:     General: No focal deficit present.     Mental Status: He is alert.  Psychiatric:        Mood and Affect: Mood normal.      EKG:   Recent Labs: 10/18/2023:  TSH 1.090 11/15/2023: ALT 31; BUN 17; Creatinine, Ser 0.90; Hemoglobin 15.0; Platelets 190; Potassium 3.2; Sodium 141    Lipid Panel    Component Value Date/Time   CHOL 139 10/18/2023 1131   TRIG 127 10/18/2023 1131   HDL 45 10/18/2023 1131   CHOLHDL 3.1 10/18/2023 1131   CHOLHDL 4.9 03/04/2022 0512   VLDL 25 03/04/2022 0512   LDLCALC 71 10/18/2023 1131   LDLCALC 110 (H) 05/14/2017 1556      Other studies Reviewed: Additional studies/ records that were reviewed today include:  Review of the above records demonstrates:       No data to display            ASSESSMENT AND PLAN:    ICD-10-CM   1. Hypercholesteremia  E78.00 ticagrelor (BRILINTA) 60 MG TABS tablet    metoprolol succinate (TOPROL-XL) 25 MG 24 hr tablet    atorvastatin (LIPITOR) 80 MG tablet    2. NSTEMI  I24.9 ticagrelor (BRILINTA) 60 MG TABS tablet    metoprolol succinate (TOPROL-XL) 25 MG 24 hr tablet    atorvastatin (LIPITOR) 80 MG tablet    3. Primary hypertension  I10 ticagrelor (BRILINTA) 60 MG TABS tablet    metoprolol succinate (TOPROL-XL) 25 MG 24 hr tablet    atorvastatin (LIPITOR) 80 MG tablet    4. CAD S/P percutaneous coronary angioplasty  I25.10 ticagrelor (BRILINTA) 60 MG TABS tablet   Z98.61 metoprolol succinate (TOPROL-XL) 25 MG 24 hr tablet    atorvastatin (LIPITOR) 80 MG tablet    5. SOB (shortness of breath)  R06.02 ticagrelor (BRILINTA) 60 MG TABS tablet    metoprolol succinate (TOPROL-XL) 25 MG 24 hr tablet    atorvastatin (LIPITOR) 80 MG tablet   Stress test 9//9  24, no ischaemia on scan but ekg had St changes    6. Preoperative cardiovascular examination  Z01.810    Hold brillanta 6 days prior to knee suergery, low risk.       Problem List Items Addressed This Visit       Cardiovascular and Mediastinum   NSTEMI   Relevant Medications   ticagrelor (BRILINTA) 60 MG TABS tablet   metoprolol succinate (TOPROL-XL) 25 MG 24 hr tablet   atorvastatin (LIPITOR) 80 MG tablet      Other   Hypercholesteremia - Primary   Relevant Medications   ticagrelor (BRILINTA) 60 MG TABS tablet   metoprolol succinate (TOPROL-XL) 25 MG 24 hr tablet   atorvastatin (LIPITOR) 80 MG tablet   Other Visit Diagnoses       Primary hypertension       Relevant Medications   ticagrelor (BRILINTA) 60 MG TABS tablet   metoprolol succinate (TOPROL-XL) 25 MG 24 hr tablet   atorvastatin (LIPITOR) 80 MG tablet     CAD S/P percutaneous coronary angioplasty       Relevant Medications   ticagrelor (BRILINTA) 60 MG TABS tablet   metoprolol succinate (TOPROL-XL) 25 MG 24 hr tablet   atorvastatin (LIPITOR) 80 MG tablet     SOB (shortness of breath)       Stress test 9//9  24, no ischaemia on scan but ekg had St changes   Relevant Medications   ticagrelor (BRILINTA) 60 MG TABS tablet   metoprolol succinate (TOPROL-XL) 25 MG 24 hr tablet   atorvastatin (LIPITOR) 80 MG tablet     Preoperative cardiovascular examination       Hold brillanta 6 days prior to knee suergery, low risk.          Disposition:   Return in about 3 months (around 02/23/2024).    Total time spent: 30 minutes  Signed,  Adrian Blackwater, MD  11/23/2023 3:10 PM    [facility]

## 2024-02-21 ENCOUNTER — Ambulatory Visit (INDEPENDENT_AMBULATORY_CARE_PROVIDER_SITE_OTHER): Admitting: Cardiovascular Disease

## 2024-02-21 ENCOUNTER — Ambulatory Visit (INDEPENDENT_AMBULATORY_CARE_PROVIDER_SITE_OTHER): Payer: Managed Care, Other (non HMO) | Admitting: Family Medicine

## 2024-02-21 ENCOUNTER — Encounter: Payer: Self-pay | Admitting: Family Medicine

## 2024-02-21 ENCOUNTER — Encounter: Payer: Self-pay | Admitting: Cardiovascular Disease

## 2024-02-21 VITALS — BP 130/90 | HR 71 | Ht 69.0 in | Wt 213.0 lb

## 2024-02-21 VITALS — BP 130/92 | HR 84 | Ht 69.0 in | Wt 213.8 lb

## 2024-02-21 DIAGNOSIS — R0602 Shortness of breath: Secondary | ICD-10-CM

## 2024-02-21 DIAGNOSIS — R1311 Dysphagia, oral phase: Secondary | ICD-10-CM

## 2024-02-21 DIAGNOSIS — G4700 Insomnia, unspecified: Secondary | ICD-10-CM | POA: Diagnosis not present

## 2024-02-21 DIAGNOSIS — Z0000 Encounter for general adult medical examination without abnormal findings: Secondary | ICD-10-CM

## 2024-02-21 DIAGNOSIS — I10 Essential (primary) hypertension: Secondary | ICD-10-CM

## 2024-02-21 DIAGNOSIS — E782 Mixed hyperlipidemia: Secondary | ICD-10-CM

## 2024-02-21 DIAGNOSIS — R030 Elevated blood-pressure reading, without diagnosis of hypertension: Secondary | ICD-10-CM

## 2024-02-21 DIAGNOSIS — I249 Acute ischemic heart disease, unspecified: Secondary | ICD-10-CM

## 2024-02-21 DIAGNOSIS — E7800 Pure hypercholesterolemia, unspecified: Secondary | ICD-10-CM | POA: Diagnosis not present

## 2024-02-21 DIAGNOSIS — Z131 Encounter for screening for diabetes mellitus: Secondary | ICD-10-CM

## 2024-02-21 DIAGNOSIS — Z9861 Coronary angioplasty status: Secondary | ICD-10-CM

## 2024-02-21 DIAGNOSIS — I2510 Atherosclerotic heart disease of native coronary artery without angina pectoris: Secondary | ICD-10-CM | POA: Diagnosis not present

## 2024-02-21 DIAGNOSIS — Z79899 Other long term (current) drug therapy: Secondary | ICD-10-CM

## 2024-02-21 DIAGNOSIS — D72818 Other decreased white blood cell count: Secondary | ICD-10-CM

## 2024-02-21 MED ORDER — METOPROLOL SUCCINATE ER 25 MG PO TB24: 25.0000 mg | ORAL_TABLET | Freq: Every day | ORAL | 2 refills | Status: DC

## 2024-02-21 MED ORDER — TICAGRELOR 60 MG PO TABS: 60.0000 mg | ORAL_TABLET | Freq: Two times a day (BID) | ORAL | 2 refills | Status: DC

## 2024-02-21 MED ORDER — LOSARTAN POTASSIUM 25 MG PO TABS: 25.0000 mg | ORAL_TABLET | Freq: Every day | ORAL | 1 refills | Status: DC

## 2024-02-21 MED ORDER — ATORVASTATIN CALCIUM 80 MG PO TABS: 80.0000 mg | ORAL_TABLET | Freq: Every evening | ORAL | 1 refills | Status: DC

## 2024-02-21 NOTE — Patient Instructions (Signed)
 It was a pleasure to see you today!  Thank you for choosing [facility] for your primary care.   Today you were seen for your annual physical  Please review the attached information regarding helpful preventive health topics.   To keep you healthy, please keep in mind the following health maintenance items that you are due for:   Health Maintenance Due  Topic Date Due   Pneumococcal Vaccine: 50+ Years (1 of 2 - PCV) Never done   DTaP/Tdap/Td (2 - Td or Tdap) 09/17/2019   Zoster Vaccines- Shingrix  (2 of 2) 08/31/2022     Best Wishes,   Dr. Verdia Glad

## 2024-02-21 NOTE — Progress Notes (Signed)
 Cardiology Office Note   Date:  02/21/2024   ID:  Richard Warren, DOB June 25, 1956, MRN [id number]  PCP:  Mimi Alt, MD  Cardiologist:  Debborah Fairly, MD      History of Present Illness: Richard Warren is a 68 y.o. male who presents for  Chief Complaint  Patient presents with   Follow-up    3 month    Had fallen and tore wrist.      Past Medical History:  Diagnosis Date   Colon polyp    GERD (gastroesophageal reflux disease)    History of kidney stones    Hyperlipidemia    Personal history of kidney stones 04/18/2018     Past Surgical History:  Procedure Laterality Date   APPENDECTOMY  1968?   COLONOSCOPY  06/28/2009   Dr Marquita Situ   COLONOSCOPY WITH PROPOFOL  N/A 06/28/2023   Procedure: COLONOSCOPY WITH PROPOFOL ;  Surgeon: Shane Darling, MD;  Location: [facility];  Service: Endoscopy;  Laterality: N/A;   CORONARY ANGIOPLASTY     CORONARY STENT INTERVENTION N/A 03/03/2022   Procedure: CORONARY STENT INTERVENTION;  Surgeon: Sammy Crisp, MD;  Location: [facility];  Service: Cardiovascular;  Laterality: N/A;   ESOPHAGOGASTRODUODENOSCOPY (EGD) WITH PROPOFOL  N/A 06/28/2023   Procedure: ESOPHAGOGASTRODUODENOSCOPY (EGD) WITH PROPOFOL ;  Surgeon: Shane Darling, MD;  Location: [facility];  Service: Endoscopy;  Laterality: N/A;   EXTRACORPOREAL SHOCK WAVE LITHOTRIPSY Left 01/07/2017   Procedure: EXTRACORPOREAL SHOCK WAVE LITHOTRIPSY (ESWL);  Surgeon: Bart Born, MD;  Location: [facility];  Service: Urology;  Laterality: Left;   HEMOSTASIS CLIP PLACEMENT  06/28/2023   Procedure: HEMOSTASIS CLIP PLACEMENT;  Surgeon: Shane Darling, MD;  Location: [facility];  Service: Endoscopy;;   LEFT HEART CATH AND CORONARY ANGIOGRAPHY N/A 03/03/2022   Procedure: LEFT HEART CATH AND CORONARY ANGIOGRAPHY;  Surgeon: Cherrie Cornwall, MD;  Location: [facility];  Service: Cardiovascular;  Laterality: N/A;    POLYPECTOMY  06/28/2023   Procedure: POLYPECTOMY;  Surgeon: Shane Darling, MD;  Location: [facility];  Service: Endoscopy;;   SUBMUCOSAL LIFTING INJECTION  06/28/2023   Procedure: SUBMUCOSAL LIFTING INJECTION;  Surgeon: Shane Darling, MD;  Location: [facility];  Service: Endoscopy;;   SUBMUCOSAL TATTOO INJECTION  06/28/2023   Procedure: SUBMUCOSAL TATTOO INJECTION;  Surgeon: Shane Darling, MD;  Location: [facility];  Service: Endoscopy;;   UPPER GI ENDOSCOPY  06/28/2009   Dr Marquita Situ   VASECTOMY       Current Outpatient Medications  Medication Sig Dispense Refill   acidophilus (RISAQUAD) CAPS capsule Take 1 capsule by mouth daily.     aspirin  EC 81 MG tablet Take 1 tablet (81 mg total) by mouth daily. Swallow whole. 30 tablet 12   Azelaic Acid 15 % gel Apply 1 Application topically daily.     Coenzyme Q10 (CO Q 10 PO) Take by mouth.     cyclobenzaprine (FLEXERIL) 5 MG tablet Take 5 mg by mouth 3 (three) times daily.     losartan (COZAAR) 25 MG tablet Take 1 tablet (25 mg total) by mouth daily. 90 tablet 1   metroNIDAZOLE  (METROGEL ) 0.75 % gel Apply topically.     Omega-3 Fatty Acids (OMEGA 3 PO) Take 3,000 mg by mouth 2 (two) times daily.     oxyCODONE -acetaminophen  (PERCOCET/ROXICET) 5-325 MG tablet Take 1 tablet by mouth every 6 (six) hours as needed for severe pain (pain score 7-10). 15 tablet 0   pantoprazole  (PROTONIX ) 40 MG tablet  Take 40 mg by mouth daily.     selenium 50 MCG TABS tablet Take 200 mcg by mouth daily.     atorvastatin  (LIPITOR) 80 MG tablet Take 1 tablet (80 mg total) by mouth every evening. 90 tablet 1   metoprolol  succinate (TOPROL -XL) 25 MG 24 hr tablet Take 1 tablet (25 mg total) by mouth daily. 30 tablet 2   ticagrelor  (BRILINTA ) 60 MG TABS tablet Take 1 tablet (60 mg total) by mouth 2 (two) times daily. 60 tablet 2   No current facility-administered medications for this visit.    Allergies:   Zolpidem    Social History:    reports that he has never smoked. He has never used smokeless tobacco. He reports current alcohol use of about 53.0 standard drinks of alcohol per week. He reports that he does not use drugs.   Family History:  family history includes Coronary artery disease in his sister; Dementia in his mother; Heart attack in his sister; Hypertension in his brother.    ROS:     Review of Systems  Constitutional: Negative.   HENT: Negative.    Eyes: Negative.   Respiratory: Negative.    Gastrointestinal: Negative.   Genitourinary: Negative.   Musculoskeletal: Negative.   Skin: Negative.   Neurological: Negative.   Endo/Heme/Allergies: Negative.   Psychiatric/Behavioral: Negative.    All other systems reviewed and are negative.     All other systems are reviewed and negative.    PHYSICAL EXAM: VS:  BP (!) 130/92   Pulse 84   Ht 5' 9 (1.753 m)   Wt 213 lb 12.8 oz (97 kg)   SpO2 96%   BMI 31.57 kg/m  , BMI Body mass index is 31.57 kg/m. Last weight:  Wt Readings from Last 3 Encounters:  02/21/24 213 lb 12.8 oz (97 kg)  02/21/24 213 lb (96.6 kg)  11/23/23 212 lb (96.2 kg)     Physical Exam Vitals reviewed.  Constitutional:      Appearance: Normal appearance. He is normal weight.  HENT:     Head: Normocephalic.     Nose: Nose normal.     Mouth/Throat:     Mouth: Mucous membranes are moist.   Eyes:     Pupils: Pupils are equal, round, and reactive to light.    Cardiovascular:     Rate and Rhythm: Normal rate and regular rhythm.     Pulses: Normal pulses.     Heart sounds: Normal heart sounds.  Pulmonary:     Effort: Pulmonary effort is normal.  Abdominal:     General: Abdomen is flat. Bowel sounds are normal.   Musculoskeletal:        General: Normal range of motion.     Cervical back: Normal range of motion.   Skin:    General: Skin is warm.   Neurological:     General: No focal deficit present.     Mental Status: He is alert.   Psychiatric:        Mood and  Affect: Mood normal.       EKG:   Recent Labs: 10/18/2023: TSH 1.090 11/15/2023: ALT 31; BUN 17; Creatinine, Ser 0.90; Hemoglobin 15.0; Platelets 190; Potassium 3.2; Sodium 141    Lipid Panel    Component Value Date/Time   CHOL 139 10/18/2023 1131   TRIG 127 10/18/2023 1131   HDL 45 10/18/2023 1131   CHOLHDL 3.1 10/18/2023 1131   CHOLHDL 4.9 03/04/2022 0512   VLDL 25 03/04/2022 0512  LDLCALC 71 10/18/2023 1131   LDLCALC 110 (H) 05/14/2017 1556      Other studies Reviewed: Additional studies/ records that were reviewed today include:  Review of the above records demonstrates:       No data to display            ASSESSMENT AND PLAN:    ICD-10-CM   1. CAD S/P percutaneous coronary angioplasty  I25.10 losartan (COZAAR) 25 MG tablet   Z98.61 PCV ECHOCARDIOGRAM COMPLETE    MYOCARDIAL PERFUSION IMAGING    atorvastatin  (LIPITOR) 80 MG tablet    metoprolol  succinate (TOPROL -XL) 25 MG 24 hr tablet    ticagrelor  (BRILINTA ) 60 MG TABS tablet   Other than a fall, no chest pain or SOB.    2. Primary hypertension  I10 losartan (COZAAR) 25 MG tablet    PCV ECHOCARDIOGRAM COMPLETE    MYOCARDIAL PERFUSION IMAGING    atorvastatin  (LIPITOR) 80 MG tablet    metoprolol  succinate (TOPROL -XL) 25 MG 24 hr tablet    ticagrelor  (BRILINTA ) 60 MG TABS tablet   Add losartan 25    3. Hypercholesteremia  E78.00 losartan (COZAAR) 25 MG tablet    PCV ECHOCARDIOGRAM COMPLETE    MYOCARDIAL PERFUSION IMAGING    atorvastatin  (LIPITOR) 80 MG tablet    metoprolol  succinate (TOPROL -XL) 25 MG 24 hr tablet    ticagrelor  (BRILINTA ) 60 MG TABS tablet    4. SOB (shortness of breath)  R06.02 losartan (COZAAR) 25 MG tablet    PCV ECHOCARDIOGRAM COMPLETE    MYOCARDIAL PERFUSION IMAGING    atorvastatin  (LIPITOR) 80 MG tablet    metoprolol  succinate (TOPROL -XL) 25 MG 24 hr tablet    ticagrelor  (BRILINTA ) 60 MG TABS tablet   Do echo and stress test    5. NSTEMI  I24.9 losartan (COZAAR) 25 MG  tablet    PCV ECHOCARDIOGRAM COMPLETE    MYOCARDIAL PERFUSION IMAGING    atorvastatin  (LIPITOR) 80 MG tablet    metoprolol  succinate (TOPROL -XL) 25 MG 24 hr tablet    ticagrelor  (BRILINTA ) 60 MG TABS tablet    6. Primary hypertension  I10 losartan (COZAAR) 25 MG tablet    PCV ECHOCARDIOGRAM COMPLETE    MYOCARDIAL PERFUSION IMAGING    atorvastatin  (LIPITOR) 80 MG tablet    metoprolol  succinate (TOPROL -XL) 25 MG 24 hr tablet    ticagrelor  (BRILINTA ) 60 MG TABS tablet    7. CAD S/P percutaneous coronary angioplasty  I25.10 losartan (COZAAR) 25 MG tablet   Z98.61 PCV ECHOCARDIOGRAM COMPLETE    MYOCARDIAL PERFUSION IMAGING    atorvastatin  (LIPITOR) 80 MG tablet    metoprolol  succinate (TOPROL -XL) 25 MG 24 hr tablet    ticagrelor  (BRILINTA ) 60 MG TABS tablet    8. SOB (shortness of breath)  R06.02 losartan (COZAAR) 25 MG tablet    PCV ECHOCARDIOGRAM COMPLETE    MYOCARDIAL PERFUSION IMAGING    atorvastatin  (LIPITOR) 80 MG tablet    metoprolol  succinate (TOPROL -XL) 25 MG 24 hr tablet    ticagrelor  (BRILINTA ) 60 MG TABS tablet   Stress test 9//9  24, no ischaemia on scan but ekg had St changes       Problem List Items Addressed This Visit       Cardiovascular and Mediastinum   NSTEMI   Relevant Medications   losartan (COZAAR) 25 MG tablet   atorvastatin  (LIPITOR) 80 MG tablet   metoprolol  succinate (TOPROL -XL) 25 MG 24 hr tablet   ticagrelor  (BRILINTA ) 60 MG TABS tablet   Other Relevant Orders  PCV ECHOCARDIOGRAM COMPLETE   MYOCARDIAL PERFUSION IMAGING     Other   Hypercholesteremia   Relevant Medications   losartan (COZAAR) 25 MG tablet   atorvastatin  (LIPITOR) 80 MG tablet   metoprolol  succinate (TOPROL -XL) 25 MG 24 hr tablet   ticagrelor  (BRILINTA ) 60 MG TABS tablet   Other Relevant Orders   PCV ECHOCARDIOGRAM COMPLETE   MYOCARDIAL PERFUSION IMAGING   Other Visit Diagnoses       CAD S/P percutaneous coronary angioplasty    -  Primary   Other than a fall, no chest  pain or SOB.   Relevant Medications   losartan (COZAAR) 25 MG tablet   atorvastatin  (LIPITOR) 80 MG tablet   metoprolol  succinate (TOPROL -XL) 25 MG 24 hr tablet   ticagrelor  (BRILINTA ) 60 MG TABS tablet   Other Relevant Orders   PCV ECHOCARDIOGRAM COMPLETE   MYOCARDIAL PERFUSION IMAGING     Primary hypertension       Add losartan 25   Relevant Medications   losartan (COZAAR) 25 MG tablet   atorvastatin  (LIPITOR) 80 MG tablet   metoprolol  succinate (TOPROL -XL) 25 MG 24 hr tablet   ticagrelor  (BRILINTA ) 60 MG TABS tablet   Other Relevant Orders   PCV ECHOCARDIOGRAM COMPLETE   MYOCARDIAL PERFUSION IMAGING     SOB (shortness of breath)       Do echo and stress test   Relevant Medications   losartan (COZAAR) 25 MG tablet   atorvastatin  (LIPITOR) 80 MG tablet   metoprolol  succinate (TOPROL -XL) 25 MG 24 hr tablet   ticagrelor  (BRILINTA ) 60 MG TABS tablet   Other Relevant Orders   PCV ECHOCARDIOGRAM COMPLETE   MYOCARDIAL PERFUSION IMAGING     Primary hypertension       Relevant Medications   losartan (COZAAR) 25 MG tablet   atorvastatin  (LIPITOR) 80 MG tablet   metoprolol  succinate (TOPROL -XL) 25 MG 24 hr tablet   ticagrelor  (BRILINTA ) 60 MG TABS tablet   Other Relevant Orders   PCV ECHOCARDIOGRAM COMPLETE   MYOCARDIAL PERFUSION IMAGING     CAD S/P percutaneous coronary angioplasty       Relevant Medications   losartan (COZAAR) 25 MG tablet   atorvastatin  (LIPITOR) 80 MG tablet   metoprolol  succinate (TOPROL -XL) 25 MG 24 hr tablet   ticagrelor  (BRILINTA ) 60 MG TABS tablet   Other Relevant Orders   PCV ECHOCARDIOGRAM COMPLETE   MYOCARDIAL PERFUSION IMAGING     SOB (shortness of breath)       Stress test 9//9  24, no ischaemia on scan but ekg had St changes   Relevant Medications   losartan (COZAAR) 25 MG tablet   atorvastatin  (LIPITOR) 80 MG tablet   metoprolol  succinate (TOPROL -XL) 25 MG 24 hr tablet   ticagrelor  (BRILINTA ) 60 MG TABS tablet   Other Relevant Orders    PCV ECHOCARDIOGRAM COMPLETE   MYOCARDIAL PERFUSION IMAGING          Disposition:   Return in about 3 weeks (around 03/13/2024) for echo, stress test and f/u.    Total time spent: 30 minutes  Signed,  Debborah Fairly, MD  02/21/2024 2:14 PM    [facility]
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
## 2024-02-22 ENCOUNTER — Ambulatory Visit: Payer: Self-pay | Admitting: Family Medicine

## 2024-02-22 LAB — CMP14+EGFR
ALT: 19 IU/L (ref 0–44)
AST: 17 IU/L (ref 0–40)
Albumin: 4.2 g/dL (ref 3.9–4.9)
Alkaline Phosphatase: 62 IU/L (ref 44–121)
BUN/Creatinine Ratio: 16 (ref 10–24)
BUN: 12 mg/dL (ref 8–27)
Bilirubin Total: 0.6 mg/dL (ref 0.0–1.2)
CO2: 20 mmol/L (ref 20–29)
Calcium: 9 mg/dL (ref 8.6–10.2)
Chloride: 107 mmol/L — ABNORMAL HIGH (ref 96–106)
Creatinine, Ser: 0.75 mg/dL — ABNORMAL LOW (ref 0.76–1.27)
Globulin, Total: 2.4 g/dL (ref 1.5–4.5)
Glucose: 95 mg/dL (ref 70–99)
Potassium: 4.1 mmol/L (ref 3.5–5.2)
Sodium: 144 mmol/L (ref 134–144)
Total Protein: 6.6 g/dL (ref 6.0–8.5)
eGFR: 100 mL/min/{1.73_m2} (ref >59)

## 2024-02-22 LAB — CBC
Hematocrit: 43.5 % (ref 37.5–51.0)
Hemoglobin: 14.4 g/dL (ref 13.0–17.7)
MCH: 32.2 pg (ref 26.6–33.0)
MCHC: 33.1 g/dL (ref 31.5–35.7)
MCV: 97 fL (ref 79–97)
Platelets: 201 10*3/uL (ref 150–450)
RBC: 4.47 x10E6/uL (ref 4.14–5.80)
RDW: 14.1 % (ref 11.6–15.4)
WBC: 2.9 10*3/uL — ABNORMAL LOW (ref 3.4–10.8)

## 2024-02-22 LAB — LIPID PANEL
Chol/HDL Ratio: 3.4 ratio (ref 0.0–5.0)
Cholesterol, Total: 157 mg/dL (ref 100–199)
HDL: 46 mg/dL (ref >39)
LDL Chol Calc (NIH): 83 mg/dL (ref 0–99)
Triglycerides: 160 mg/dL — ABNORMAL HIGH (ref 0–149)
VLDL Cholesterol Cal: 28 mg/dL (ref 5–40)

## 2024-02-22 LAB — HEMOGLOBIN A1C
Est. average glucose Bld gHb Est-mCnc: 123 mg/dL
Hgb A1c MFr Bld: 5.9 % — ABNORMAL HIGH (ref 4.8–5.6)

## 2024-03-02 ENCOUNTER — Ambulatory Visit

## 2024-03-02 DIAGNOSIS — I259 Chronic ischemic heart disease, unspecified: Secondary | ICD-10-CM | POA: Diagnosis not present

## 2024-03-02 DIAGNOSIS — R0602 Shortness of breath: Secondary | ICD-10-CM | POA: Diagnosis not present

## 2024-03-02 DIAGNOSIS — I2510 Atherosclerotic heart disease of native coronary artery without angina pectoris: Secondary | ICD-10-CM

## 2024-03-02 DIAGNOSIS — I249 Acute ischemic heart disease, unspecified: Secondary | ICD-10-CM

## 2024-03-02 DIAGNOSIS — I10 Essential (primary) hypertension: Secondary | ICD-10-CM

## 2024-03-02 DIAGNOSIS — E7800 Pure hypercholesterolemia, unspecified: Secondary | ICD-10-CM

## 2024-03-02 MED ORDER — TECHNETIUM TC 99M SESTAMIBI GENERIC - CARDIOLITE
30.0000 | Freq: Once | INTRAVENOUS | Status: AC | PRN
  Administered 2024-03-02: 30 via INTRAVENOUS

## 2024-03-02 MED ORDER — TECHNETIUM TC 99M SESTAMIBI GENERIC - CARDIOLITE
10.0000 | Freq: Once | INTRAVENOUS | Status: AC | PRN
  Administered 2024-03-02: 10 via INTRAVENOUS

## 2024-03-17 ENCOUNTER — Ambulatory Visit (INDEPENDENT_AMBULATORY_CARE_PROVIDER_SITE_OTHER)

## 2024-03-17 DIAGNOSIS — I340 Nonrheumatic mitral (valve) insufficiency: Secondary | ICD-10-CM

## 2024-03-17 DIAGNOSIS — I371 Nonrheumatic pulmonary valve insufficiency: Secondary | ICD-10-CM

## 2024-03-17 DIAGNOSIS — I10 Essential (primary) hypertension: Secondary | ICD-10-CM

## 2024-03-17 DIAGNOSIS — R0602 Shortness of breath: Secondary | ICD-10-CM

## 2024-03-17 DIAGNOSIS — I361 Nonrheumatic tricuspid (valve) insufficiency: Secondary | ICD-10-CM | POA: Diagnosis not present

## 2024-03-17 DIAGNOSIS — E7800 Pure hypercholesterolemia, unspecified: Secondary | ICD-10-CM

## 2024-03-17 DIAGNOSIS — I249 Acute ischemic heart disease, unspecified: Secondary | ICD-10-CM

## 2024-03-17 DIAGNOSIS — I2510 Atherosclerotic heart disease of native coronary artery without angina pectoris: Secondary | ICD-10-CM

## 2024-03-24 ENCOUNTER — Ambulatory Visit: Admitting: Cardiovascular Disease

## 2024-03-24 ENCOUNTER — Encounter: Payer: Self-pay | Admitting: Cardiovascular Disease

## 2024-03-24 VITALS — BP 140/80 | HR 76 | Ht 69.0 in | Wt 207.0 lb

## 2024-03-24 DIAGNOSIS — K219 Gastro-esophageal reflux disease without esophagitis: Secondary | ICD-10-CM | POA: Diagnosis not present

## 2024-03-24 DIAGNOSIS — R0789 Other chest pain: Secondary | ICD-10-CM

## 2024-03-24 DIAGNOSIS — I249 Acute ischemic heart disease, unspecified: Secondary | ICD-10-CM | POA: Diagnosis not present

## 2024-03-24 DIAGNOSIS — E7800 Pure hypercholesterolemia, unspecified: Secondary | ICD-10-CM | POA: Diagnosis not present

## 2024-03-24 DIAGNOSIS — R030 Elevated blood-pressure reading, without diagnosis of hypertension: Secondary | ICD-10-CM

## 2024-03-24 DIAGNOSIS — I5033 Acute on chronic diastolic (congestive) heart failure: Secondary | ICD-10-CM

## 2024-03-24 DIAGNOSIS — I455 Other specified heart block: Secondary | ICD-10-CM

## 2024-03-24 MED ORDER — DAPAGLIFLOZIN PROPANEDIOL 10 MG PO TABS: 10.0000 mg | ORAL_TABLET | Freq: Every day | ORAL | 3 refills | Status: DC

## 2024-03-24 MED ORDER — LOSARTAN POTASSIUM 50 MG PO TABS: 50.0000 mg | ORAL_TABLET | Freq: Two times a day (BID) | ORAL | 11 refills | Status: DC

## 2024-03-24 NOTE — Progress Notes (Signed)
 Cardiology Office Note   Date:  03/24/2024   ID:  Richard Warren, DOB [birth year], MRN [id number]  PCP:  Sharma Coyer, MD  Cardiologist:  Denyse Bathe, MD      History of Present Illness: Richard Warren is a 68 y.o. male who presents for  Chief Complaint  Patient presents with   Follow-up    NST/Echo results    Feelinfine.      Past Medical History:  Diagnosis Date   Colon polyp    GERD (gastroesophageal reflux disease)    History of kidney stones    Hyperlipidemia    Personal history of kidney stones 04/18/2018     Past Surgical History:  Procedure Laterality Date   APPENDECTOMY  1968?   COLONOSCOPY  06/28/2009   Dr Dessa   COLONOSCOPY WITH PROPOFOL  N/A 06/28/2023   Procedure: COLONOSCOPY WITH PROPOFOL ;  Surgeon: Maryruth Ole DASEN, MD;  Location: [facility] ENDOSCOPY;  Service: Endoscopy;  Laterality: N/A;   CORONARY ANGIOPLASTY     CORONARY STENT INTERVENTION N/A 03/03/2022   Procedure: CORONARY STENT INTERVENTION;  Surgeon: Mady Bruckner, MD;  Location: [facility] INVASIVE CV LAB;  Service: Cardiovascular;  Laterality: N/A;   ESOPHAGOGASTRODUODENOSCOPY (EGD) WITH PROPOFOL  N/A 06/28/2023   Procedure: ESOPHAGOGASTRODUODENOSCOPY (EGD) WITH PROPOFOL ;  Surgeon: Maryruth Ole DASEN, MD;  Location: [facility] ENDOSCOPY;  Service: Endoscopy;  Laterality: N/A;   EXTRACORPOREAL SHOCK WAVE LITHOTRIPSY Left 01/07/2017   Procedure: EXTRACORPOREAL SHOCK WAVE LITHOTRIPSY (ESWL);  Surgeon: Redell Lynwood Napoleon, MD;  Location: [facility] ORS;  Service: Urology;  Laterality: Left;   HEMOSTASIS CLIP PLACEMENT  06/28/2023   Procedure: HEMOSTASIS CLIP PLACEMENT;  Surgeon: Maryruth Ole DASEN, MD;  Location: [facility] ENDOSCOPY;  Service: Endoscopy;;   LEFT HEART CATH AND CORONARY ANGIOGRAPHY N/A 03/03/2022   Procedure: LEFT HEART CATH AND CORONARY ANGIOGRAPHY;  Surgeon: Bathe Denyse LABOR, MD;  Location: [facility] INVASIVE CV LAB;  Service: Cardiovascular;  Laterality: N/A;   POLYPECTOMY   06/28/2023   Procedure: POLYPECTOMY;  Surgeon: Maryruth Ole DASEN, MD;  Location: [facility] ENDOSCOPY;  Service: Endoscopy;;   SUBMUCOSAL LIFTING INJECTION  06/28/2023   Procedure: SUBMUCOSAL LIFTING INJECTION;  Surgeon: Maryruth Ole DASEN, MD;  Location: [facility] ENDOSCOPY;  Service: Endoscopy;;   SUBMUCOSAL TATTOO INJECTION  06/28/2023   Procedure: SUBMUCOSAL TATTOO INJECTION;  Surgeon: Maryruth Ole DASEN, MD;  Location: [facility] ENDOSCOPY;  Service: Endoscopy;;   UPPER GI ENDOSCOPY  06/28/2009   Dr Dessa   VASECTOMY       Current Outpatient Medications  Medication Sig Dispense Refill   acidophilus (RISAQUAD) CAPS capsule Take 1 capsule by mouth daily.     aspirin  EC 81 MG tablet Take 1 tablet (81 mg total) by mouth daily. Swallow whole. 30 tablet 12   atorvastatin  (LIPITOR) 80 MG tablet Take 1 tablet (80 mg total) by mouth every evening. 90 tablet 1   Azelaic Acid 15 % gel Apply 1 Application topically daily.     Coenzyme Q10 (CO Q 10 PO) Take by mouth.     cyclobenzaprine (FLEXERIL) 5 MG tablet Take 5 mg by mouth 3 (three) times daily.     dapagliflozin propanediol (FARXIGA) 10 MG TABS tablet Take 1 tablet (10 mg total) by mouth daily before breakfast. 30 tablet 3   losartan  (COZAAR ) 50 MG tablet Take 1 tablet (50 mg total) by mouth 2 (two) times daily. 60 tablet 11   metoprolol  succinate (TOPROL -XL) 25 MG 24 hr tablet Take 1 tablet (25 mg total) by mouth daily. 30 tablet 2  metroNIDAZOLE  (METROGEL ) 0.75 % gel Apply topically.     Omega-3 Fatty Acids (OMEGA 3 PO) Take 3,000 mg by mouth 2 (two) times daily.     oxyCODONE -acetaminophen  (PERCOCET/ROXICET) 5-325 MG tablet Take 1 tablet by mouth every 6 (six) hours as needed for severe pain (pain score 7-10). 15 tablet 0   pantoprazole  (PROTONIX ) 40 MG tablet Take 40 mg by mouth daily.     selenium 50 MCG TABS tablet Take 200 mcg by mouth daily.     ticagrelor  (BRILINTA ) 60 MG TABS tablet Take 1 tablet (60 mg total) by mouth 2 (two) times daily.  60 tablet 2   No current facility-administered medications for this visit.    Allergies:   Zolpidem    Social History:   reports that he has never smoked. He has never used smokeless tobacco. He reports current alcohol use of about 53.0 standard drinks of alcohol per week. He reports that he does not use drugs.   Family History:  family history includes Coronary artery disease in his sister; Dementia in his mother; Heart attack in his sister; Hypertension in his brother.    ROS:     Review of Systems  Constitutional: Negative.   HENT: Negative.    Eyes: Negative.   Respiratory: Negative.    Gastrointestinal: Negative.   Genitourinary: Negative.   Musculoskeletal: Negative.   Skin: Negative.   Neurological: Negative.   Endo/Heme/Allergies: Negative.   Psychiatric/Behavioral: Negative.    All other systems reviewed and are negative.     All other systems are reviewed and negative.    PHYSICAL EXAM: VS:  BP (!) 140/80   Pulse 76   Ht 5' 9 (1.753 m)   Wt 207 lb (93.9 kg)   SpO2 97%   BMI 30.57 kg/m  , BMI Body mass index is 30.57 kg/m. Last weight:  Wt Readings from Last 3 Encounters:  03/24/24 207 lb (93.9 kg)  02/21/24 213 lb 12.8 oz (97 kg)  02/21/24 213 lb (96.6 kg)     Physical Exam Vitals reviewed.  Constitutional:      Appearance: Normal appearance. He is normal weight.  HENT:     Head: Normocephalic.     Nose: Nose normal.     Mouth/Throat:     Mouth: Mucous membranes are moist.  Eyes:     Pupils: Pupils are equal, round, and reactive to light.  Cardiovascular:     Rate and Rhythm: Normal rate and regular rhythm.     Pulses: Normal pulses.     Heart sounds: Normal heart sounds.  Pulmonary:     Effort: Pulmonary effort is normal.  Abdominal:     General: Abdomen is flat. Bowel sounds are normal.  Musculoskeletal:        General: Normal range of motion.     Cervical back: Normal range of motion.  Skin:    General: Skin is warm.   Neurological:     General: No focal deficit present.     Mental Status: He is alert.  Psychiatric:        Mood and Affect: Mood normal.       EKG:   Recent Labs: 10/18/2023: TSH 1.090 02/21/2024: ALT 19; BUN 12; Creatinine, Ser 0.75; Hemoglobin 14.4; Platelets 201; Potassium 4.1; Sodium 144    Lipid Panel    Component Value Date/Time   CHOL 157 02/21/2024 0000   TRIG 160 (H) 02/21/2024 0000   HDL 46 02/21/2024 0000   CHOLHDL 3.4 02/21/2024 0000  CHOLHDL 4.9 03/04/2022 0512   VLDL 25 03/04/2022 0512   LDLCALC 83 02/21/2024 0000   LDLCALC 110 (H) 05/14/2017 1556      Other studies Reviewed: Additional studies/ records that were reviewed today include:  Review of the above records demonstrates:       No data to display            ASSESSMENT AND PLAN:    ICD-10-CM   1. NSTEMI  I24.9 losartan  (COZAAR ) 50 MG tablet    dapagliflozin propanediol (FARXIGA) 10 MG TABS tablet    2. Sinus arrest  I45.5 losartan  (COZAAR ) 50 MG tablet    dapagliflozin propanediol (FARXIGA) 10 MG TABS tablet    3. Gastroesophageal reflux disease without esophagitis  K21.9 losartan  (COZAAR ) 50 MG tablet    dapagliflozin propanediol (FARXIGA) 10 MG TABS tablet    4. Hypercholesteremia  E78.00 losartan  (COZAAR ) 50 MG tablet    dapagliflozin propanediol (FARXIGA) 10 MG TABS tablet    5. Elevated BP without diagnosis of hypertension  R03.0 losartan  (COZAAR ) 50 MG tablet    dapagliflozin propanediol (FARXIGA) 10 MG TABS tablet   go to 50 losartan , echo normal EF grade 1 diastolic dysfunction. added  farxiga  as SOB.    6. Other chest pain  R07.89 losartan  (COZAAR ) 50 MG tablet    dapagliflozin propanediol (FARXIGA) 10 MG TABS tablet   stress test normal, infrequent chest pain    7. CHF (congestive heart failure), NYHA class III, acute on chronic, diastolic (HCC)  I50.33    grade 1 daistolic dysfunction and SOB, add farxiga       Problem List Items Addressed This Visit        Cardiovascular and Mediastinum   NSTEMI - Primary   Relevant Medications   losartan  (COZAAR ) 50 MG tablet   dapagliflozin propanediol (FARXIGA) 10 MG TABS tablet   Sinus arrest   Relevant Medications   losartan  (COZAAR ) 50 MG tablet   dapagliflozin propanediol (FARXIGA) 10 MG TABS tablet     Digestive   Esophageal reflux   Relevant Medications   losartan  (COZAAR ) 50 MG tablet   dapagliflozin propanediol (FARXIGA) 10 MG TABS tablet     Other   Hypercholesteremia   Relevant Medications   losartan  (COZAAR ) 50 MG tablet   dapagliflozin propanediol (FARXIGA) 10 MG TABS tablet   Elevated BP without diagnosis of hypertension   Relevant Medications   losartan  (COZAAR ) 50 MG tablet   dapagliflozin propanediol (FARXIGA) 10 MG TABS tablet   Other Visit Diagnoses       Other chest pain       stress test normal, infrequent chest pain   Relevant Medications   losartan  (COZAAR ) 50 MG tablet   dapagliflozin propanediol (FARXIGA) 10 MG TABS tablet     CHF (congestive heart failure), NYHA class III, acute on chronic, diastolic (HCC)       grade 1 daistolic dysfunction and SOB, add farxiga   Relevant Medications   losartan  (COZAAR ) 50 MG tablet          Disposition:   Return in about 2 months (around 05/25/2024).    Total time spent: 30 minutes  Signed,  Denyse Bathe, MD  03/24/2024 11:32 AM    [facility]

## 2024-04-04 ENCOUNTER — Other Ambulatory Visit: Payer: Self-pay

## 2024-04-04 ENCOUNTER — Telehealth: Payer: Self-pay | Admitting: Cardiovascular Disease

## 2024-04-04 NOTE — Telephone Encounter (Signed)
 error

## 2024-05-24 ENCOUNTER — Other Ambulatory Visit: Payer: Self-pay | Admitting: Cardiovascular Disease

## 2024-05-24 DIAGNOSIS — R0602 Shortness of breath: Secondary | ICD-10-CM

## 2024-05-24 DIAGNOSIS — I10 Essential (primary) hypertension: Secondary | ICD-10-CM

## 2024-05-24 DIAGNOSIS — E7800 Pure hypercholesterolemia, unspecified: Secondary | ICD-10-CM

## 2024-05-24 DIAGNOSIS — I2510 Atherosclerotic heart disease of native coronary artery without angina pectoris: Secondary | ICD-10-CM

## 2024-05-24 DIAGNOSIS — I249 Acute ischemic heart disease, unspecified: Secondary | ICD-10-CM

## 2024-05-26 ENCOUNTER — Encounter: Payer: Self-pay | Admitting: Cardiovascular Disease

## 2024-05-26 ENCOUNTER — Ambulatory Visit (INDEPENDENT_AMBULATORY_CARE_PROVIDER_SITE_OTHER): Admitting: Cardiovascular Disease

## 2024-05-26 VITALS — BP 126/70 | HR 65 | Ht 69.0 in | Wt 204.0 lb

## 2024-05-26 DIAGNOSIS — Z79899 Other long term (current) drug therapy: Secondary | ICD-10-CM

## 2024-05-26 DIAGNOSIS — I10 Essential (primary) hypertension: Secondary | ICD-10-CM | POA: Diagnosis not present

## 2024-05-26 DIAGNOSIS — E7800 Pure hypercholesterolemia, unspecified: Secondary | ICD-10-CM

## 2024-05-26 DIAGNOSIS — I249 Acute ischemic heart disease, unspecified: Secondary | ICD-10-CM

## 2024-05-26 DIAGNOSIS — I455 Other specified heart block: Secondary | ICD-10-CM

## 2024-05-26 DIAGNOSIS — I2510 Atherosclerotic heart disease of native coronary artery without angina pectoris: Secondary | ICD-10-CM | POA: Diagnosis not present

## 2024-05-26 DIAGNOSIS — R0789 Other chest pain: Secondary | ICD-10-CM | POA: Diagnosis not present

## 2024-05-26 DIAGNOSIS — R0602 Shortness of breath: Secondary | ICD-10-CM

## 2024-05-26 DIAGNOSIS — K219 Gastro-esophageal reflux disease without esophagitis: Secondary | ICD-10-CM

## 2024-05-26 DIAGNOSIS — Z9861 Coronary angioplasty status: Secondary | ICD-10-CM

## 2024-05-26 DIAGNOSIS — R030 Elevated blood-pressure reading, without diagnosis of hypertension: Secondary | ICD-10-CM

## 2024-05-26 MED ORDER — LOSARTAN POTASSIUM 50 MG PO TABS: 50.0000 mg | ORAL_TABLET | Freq: Two times a day (BID) | ORAL | 11 refills | Status: AC

## 2024-05-26 MED ORDER — TICAGRELOR 60 MG PO TABS: 60.0000 mg | ORAL_TABLET | Freq: Two times a day (BID) | ORAL | 2 refills | Status: DC

## 2024-05-26 MED ORDER — ASPIRIN 81 MG PO TBEC: 81.0000 mg | DELAYED_RELEASE_TABLET | Freq: Every day | ORAL | 12 refills | Status: AC

## 2024-05-26 MED ORDER — DAPAGLIFLOZIN PROPANEDIOL 10 MG PO TABS: 10.0000 mg | ORAL_TABLET | Freq: Every day | ORAL | 3 refills | Status: AC

## 2024-05-26 MED ORDER — PANTOPRAZOLE SODIUM 40 MG PO TBEC: 40.0000 mg | DELAYED_RELEASE_TABLET | Freq: Every day | ORAL | 11 refills | Status: DC

## 2024-05-26 MED ORDER — METOPROLOL SUCCINATE ER 25 MG PO TB24: 25.0000 mg | ORAL_TABLET | Freq: Every day | ORAL | 0 refills | Status: DC

## 2024-05-26 NOTE — Progress Notes (Signed)
 Cardiology Office Note   Date:  05/26/2024   ID:  Richard Warren, DOB Dec 01, 1955, MRN [id number]  PCP:  Sharma Coyer, MD  Cardiologist:  Denyse Bathe, MD      History of Present Illness: Richard Warren is a 68 y.o. male who presents for  Chief Complaint  Patient presents with   Follow-up    2 months follow up    HPI    Past Medical History:  Diagnosis Date   Colon polyp    GERD (gastroesophageal reflux disease)    History of kidney stones    Hyperlipidemia    Personal history of kidney stones 04/18/2018     Past Surgical History:  Procedure Laterality Date   APPENDECTOMY  1968?   COLONOSCOPY  06/28/2009   Dr Dessa   COLONOSCOPY WITH PROPOFOL  N/A 06/28/2023   Procedure: COLONOSCOPY WITH PROPOFOL ;  Surgeon: Maryruth Ole DASEN, MD;  Location: [facility];  Service: Endoscopy;  Laterality: N/A;   CORONARY ANGIOPLASTY     CORONARY STENT INTERVENTION N/A 03/03/2022   Procedure: CORONARY STENT INTERVENTION;  Surgeon: Mady Bruckner, MD;  Location: [facility] INVASIVE CV [facility];  Service: Cardiovascular;  Laterality: N/A;   ESOPHAGOGASTRODUODENOSCOPY (EGD) WITH PROPOFOL  N/A 06/28/2023   Procedure: ESOPHAGOGASTRODUODENOSCOPY (EGD) WITH PROPOFOL ;  Surgeon: Maryruth Ole DASEN, MD;  Location: [facility] ENDOSCOPY;  Service: Endoscopy;  Laterality: N/A;   EXTRACORPOREAL SHOCK WAVE LITHOTRIPSY Left 01/07/2017   Procedure: EXTRACORPOREAL SHOCK WAVE LITHOTRIPSY (ESWL);  Surgeon: Redell Lynwood Napoleon, MD;  Location: [facility] ORS;  Service: Urology;  Laterality: Left;   HEMOSTASIS CLIP PLACEMENT  06/28/2023   Procedure: HEMOSTASIS CLIP PLACEMENT;  Surgeon: Maryruth Ole DASEN, MD;  Location: [facility] ENDOSCOPY;  Service: Endoscopy;;   LEFT HEART CATH AND CORONARY ANGIOGRAPHY N/A 03/03/2022   Procedure: LEFT HEART CATH AND CORONARY ANGIOGRAPHY;  Surgeon: Bathe Denyse LABOR, MD;  Location: [facility];  Service: Cardiovascular;  Laterality: N/A;   POLYPECTOMY  06/28/2023    Procedure: POLYPECTOMY;  Surgeon: Maryruth Ole DASEN, MD;  Location: [facility] ENDOSCOPY;  Service: Endoscopy;;   SUBMUCOSAL LIFTING INJECTION  06/28/2023   Procedure: SUBMUCOSAL LIFTING INJECTION;  Surgeon: Maryruth Ole DASEN, MD;  Location: [facility] ENDOSCOPY;  Service: Endoscopy;;   SUBMUCOSAL TATTOO INJECTION  06/28/2023   Procedure: SUBMUCOSAL TATTOO INJECTION;  Surgeon: Maryruth Ole DASEN, MD;  Location: [facility] ENDOSCOPY;  Service: Endoscopy;;   UPPER GI ENDOSCOPY  06/28/2009   Dr Dessa   VASECTOMY       Current Outpatient Medications  Medication Sig Dispense Refill   acidophilus (RISAQUAD) CAPS capsule Take 1 capsule by mouth daily.     atorvastatin  (LIPITOR) 80 MG tablet Take 1 tablet (80 mg total) by mouth every evening. 90 tablet 1   Azelaic Acid 15 % gel Apply 1 Application topically daily.     Coenzyme Q10 (CO Q 10 PO) Take by mouth.     cyclobenzaprine (FLEXERIL) 5 MG tablet Take 5 mg by mouth 3 (three) times daily.     Omega-3 Fatty Acids (OMEGA 3 PO) Take 3,000 mg by mouth 2 (two) times daily.     selenium 50 MCG TABS tablet Take 200 mcg by mouth daily.     aspirin  EC 81 MG tablet Take 1 tablet (81 mg total) by mouth daily. Swallow whole. 30 tablet 12   dapagliflozin  propanediol (FARXIGA ) 10 MG TABS tablet Take 1 tablet (10 mg total) by mouth daily before breakfast. 30 tablet 3   losartan  (COZAAR ) 50 MG tablet Take 1 tablet (  50 mg total) by mouth 2 (two) times daily. 60 tablet 11   metoprolol  succinate (TOPROL -XL) 25 MG 24 hr tablet Take 1 tablet (25 mg total) by mouth daily. 30 tablet 0   pantoprazole  (PROTONIX ) 40 MG tablet Take 1 tablet (40 mg total) by mouth daily. 30 tablet 11   ticagrelor  (BRILINTA ) 60 MG TABS tablet Take 1 tablet (60 mg total) by mouth 2 (two) times daily. 60 tablet 2   No current facility-administered medications for this visit.    Allergies:   Zolpidem    Social History:   reports that he has never smoked. He has never used smokeless tobacco. He  reports current alcohol use of about 53.0 standard drinks of alcohol per week. He reports that he does not use drugs.   Family History:  family history includes Coronary artery disease in his sister; Dementia in his mother; Heart attack in his sister; Hypertension in his brother.    ROS:     Review of Systems  Constitutional: Negative.   HENT: Negative.    Eyes: Negative.   Respiratory: Negative.    Gastrointestinal: Negative.   Genitourinary: Negative.   Musculoskeletal: Negative.   Skin: Negative.   Neurological: Negative.   Endo/Heme/Allergies: Negative.   Psychiatric/Behavioral: Negative.    All other systems reviewed and are negative.     All other systems are reviewed and negative.    PHYSICAL EXAM: VS:  BP 126/70   Pulse 65   Ht 5' 9 (1.753 m)   Wt 204 lb (92.5 kg)   SpO2 93%   BMI 30.13 kg/m  , BMI Body mass index is 30.13 kg/m. Last weight:  Wt Readings from Last 3 Encounters:  05/26/24 204 lb (92.5 kg)  03/24/24 207 lb (93.9 kg)  02/21/24 213 lb 12.8 oz (97 kg)     Physical Exam Vitals reviewed.  Constitutional:      Appearance: Normal appearance. He is normal weight.  HENT:     Head: Normocephalic.     Nose: Nose normal.     Mouth/Throat:     Mouth: Mucous membranes are moist.  Eyes:     Pupils: Pupils are equal, round, and reactive to light.  Cardiovascular:     Rate and Rhythm: Normal rate and regular rhythm.     Pulses: Normal pulses.     Heart sounds: Normal heart sounds.  Pulmonary:     Effort: Pulmonary effort is normal.  Abdominal:     General: Abdomen is flat. Bowel sounds are normal.  Musculoskeletal:        General: Normal range of motion.     Cervical back: Normal range of motion.  Skin:    General: Skin is warm.  Neurological:     General: No focal deficit present.     Mental Status: He is alert.  Psychiatric:        Mood and Affect: Mood normal.       EKG:   Recent Labs: 10/18/2023: TSH 1.090 02/21/2024: ALT 19; BUN  12; Creatinine, Ser 0.75; Hemoglobin 14.4; Platelets 201; Potassium 4.1; Sodium 144    Lipid Panel    Component Value Date/Time   CHOL 157 02/21/2024 0000   TRIG 160 (H) 02/21/2024 0000   HDL 46 02/21/2024 0000   CHOLHDL 3.4 02/21/2024 0000   CHOLHDL 4.9 03/04/2022 0512   VLDL 25 03/04/2022 0512   LDLCALC 83 02/21/2024 0000   LDLCALC 110 (H) 05/14/2017 1556      Other studies Reviewed:  Additional studies/ records that were reviewed today include:  Review of the above records demonstrates:       No data to display            ASSESSMENT AND PLAN:    ICD-10-CM   1. Sinus arrest  I45.5 losartan  (COZAAR ) 50 MG tablet    dapagliflozin  propanediol (FARXIGA ) 10 MG TABS tablet    2. CAD S/P percutaneous coronary angioplasty  I25.10 ticagrelor  (BRILINTA ) 60 MG TABS tablet   Z98.61 metoprolol  succinate (TOPROL -XL) 25 MG 24 hr tablet   Other than a fall, no chest pain or SOB.    3. Primary hypertension  I10 ticagrelor  (BRILINTA ) 60 MG TABS tablet    metoprolol  succinate (TOPROL -XL) 25 MG 24 hr tablet   Add losartan  25    4. Hypercholesteremia  E78.00 ticagrelor  (BRILINTA ) 60 MG TABS tablet    losartan  (COZAAR ) 50 MG tablet    metoprolol  succinate (TOPROL -XL) 25 MG 24 hr tablet    dapagliflozin  propanediol (FARXIGA ) 10 MG TABS tablet    5. SOB (shortness of breath)  R06.02 ticagrelor  (BRILINTA ) 60 MG TABS tablet    metoprolol  succinate (TOPROL -XL) 25 MG 24 hr tablet   Do echo and stress test    6. NSTEMI  I24.9 ticagrelor  (BRILINTA ) 60 MG TABS tablet    losartan  (COZAAR ) 50 MG tablet    metoprolol  succinate (TOPROL -XL) 25 MG 24 hr tablet    dapagliflozin  propanediol (FARXIGA ) 10 MG TABS tablet    7. Primary hypertension  I10 ticagrelor  (BRILINTA ) 60 MG TABS tablet    metoprolol  succinate (TOPROL -XL) 25 MG 24 hr tablet    8. CAD S/P percutaneous coronary angioplasty  I25.10 ticagrelor  (BRILINTA ) 60 MG TABS tablet   Z98.61 metoprolol  succinate (TOPROL -XL) 25 MG 24 hr  tablet    9. SOB (shortness of breath)  R06.02 ticagrelor  (BRILINTA ) 60 MG TABS tablet    metoprolol  succinate (TOPROL -XL) 25 MG 24 hr tablet   Stress test 9//9  24, no ischaemia on scan but ekg had St changes    10. On statin therapy  Z79.899     11. Gastroesophageal reflux disease without esophagitis  K21.9 losartan  (COZAAR ) 50 MG tablet    dapagliflozin  propanediol (FARXIGA ) 10 MG TABS tablet    12. Elevated BP without diagnosis of hypertension  R03.0 losartan  (COZAAR ) 50 MG tablet    dapagliflozin  propanediol (FARXIGA ) 10 MG TABS tablet   go to 50 losartan , echo normal EF grade 1 diastolic dysfunction. added  farxiga   as SOB.    13. Other chest pain  R07.89 losartan  (COZAAR ) 50 MG tablet    dapagliflozin  propanediol (FARXIGA ) 10 MG TABS tablet   stress test normal, infrequent chest pain       Problem List Items Addressed This Visit       Cardiovascular and Mediastinum   NSTEMI   Relevant Medications   ticagrelor  (BRILINTA ) 60 MG TABS tablet   losartan  (COZAAR ) 50 MG tablet   metoprolol  succinate (TOPROL -XL) 25 MG 24 hr tablet   dapagliflozin  propanediol (FARXIGA ) 10 MG TABS tablet   aspirin  EC 81 MG tablet   Sinus arrest - Primary   Relevant Medications   losartan  (COZAAR ) 50 MG tablet   metoprolol  succinate (TOPROL -XL) 25 MG 24 hr tablet   dapagliflozin  propanediol (FARXIGA ) 10 MG TABS tablet   aspirin  EC 81 MG tablet     Digestive   Esophageal reflux   Relevant Medications   losartan  (COZAAR ) 50 MG tablet   pantoprazole  (PROTONIX ) 40  MG tablet   dapagliflozin  propanediol (FARXIGA ) 10 MG TABS tablet     Other   Hypercholesteremia   Relevant Medications   ticagrelor  (BRILINTA ) 60 MG TABS tablet   losartan  (COZAAR ) 50 MG tablet   metoprolol  succinate (TOPROL -XL) 25 MG 24 hr tablet   dapagliflozin  propanediol (FARXIGA ) 10 MG TABS tablet   aspirin  EC 81 MG tablet   On statin therapy   Elevated BP without diagnosis of hypertension   Relevant Medications    losartan  (COZAAR ) 50 MG tablet   dapagliflozin  propanediol (FARXIGA ) 10 MG TABS tablet   Other Visit Diagnoses       CAD S/P percutaneous coronary angioplasty       Other than a fall, no chest pain or SOB.   Relevant Medications   ticagrelor  (BRILINTA ) 60 MG TABS tablet   losartan  (COZAAR ) 50 MG tablet   metoprolol  succinate (TOPROL -XL) 25 MG 24 hr tablet   aspirin  EC 81 MG tablet     Primary hypertension       Add losartan  25   Relevant Medications   ticagrelor  (BRILINTA ) 60 MG TABS tablet   losartan  (COZAAR ) 50 MG tablet   metoprolol  succinate (TOPROL -XL) 25 MG 24 hr tablet   aspirin  EC 81 MG tablet     SOB (shortness of breath)       Do echo and stress test   Relevant Medications   ticagrelor  (BRILINTA ) 60 MG TABS tablet   metoprolol  succinate (TOPROL -XL) 25 MG 24 hr tablet     Primary hypertension       Relevant Medications   ticagrelor  (BRILINTA ) 60 MG TABS tablet   losartan  (COZAAR ) 50 MG tablet   metoprolol  succinate (TOPROL -XL) 25 MG 24 hr tablet   aspirin  EC 81 MG tablet     CAD S/P percutaneous coronary angioplasty       Relevant Medications   ticagrelor  (BRILINTA ) 60 MG TABS tablet   losartan  (COZAAR ) 50 MG tablet   metoprolol  succinate (TOPROL -XL) 25 MG 24 hr tablet   aspirin  EC 81 MG tablet     SOB (shortness of breath)       Stress test 9//9  24, no ischaemia on scan but ekg had St changes   Relevant Medications   ticagrelor  (BRILINTA ) 60 MG TABS tablet   metoprolol  succinate (TOPROL -XL) 25 MG 24 hr tablet     Other chest pain       stress test normal, infrequent chest pain   Relevant Medications   losartan  (COZAAR ) 50 MG tablet   dapagliflozin  propanediol (FARXIGA ) 10 MG TABS tablet          Disposition:   Return in about 3 months (around 08/25/2024).    Total time spent: 30 minutes  Signed,  Denyse Bathe, MD  05/26/2024 10:58 AM    [facility]

## 2024-06-26 ENCOUNTER — Ambulatory Visit: Admitting: Family Medicine

## 2024-07-13 ENCOUNTER — Telehealth: Payer: Self-pay

## 2024-07-13 ENCOUNTER — Other Ambulatory Visit: Payer: Self-pay | Admitting: Cardiology

## 2024-07-13 MED ORDER — CLOPIDOGREL BISULFATE 75 MG PO TABS: 75.0000 mg | ORAL_TABLET | Freq: Every day | ORAL | 0 refills | Status: DC

## 2024-07-13 NOTE — Telephone Encounter (Signed)
 Pt states his blood thinners were $300 and was wondering if there is an alternative they can get for cheaper.

## 2024-07-18 NOTE — Telephone Encounter (Signed)
 Left vm to return call.

## 2024-07-20 ENCOUNTER — Ambulatory Visit (INDEPENDENT_AMBULATORY_CARE_PROVIDER_SITE_OTHER): Admitting: Family Medicine

## 2024-07-20 ENCOUNTER — Encounter: Payer: Self-pay | Admitting: Family Medicine

## 2024-07-20 VITALS — BP 119/78 | HR 60 | Temp 97.9°F | Ht 69.0 in | Wt 206.5 lb

## 2024-07-20 DIAGNOSIS — E7800 Pure hypercholesterolemia, unspecified: Secondary | ICD-10-CM | POA: Diagnosis not present

## 2024-07-20 DIAGNOSIS — Z79899 Other long term (current) drug therapy: Secondary | ICD-10-CM

## 2024-07-20 DIAGNOSIS — I252 Old myocardial infarction: Secondary | ICD-10-CM | POA: Insufficient documentation

## 2024-07-20 DIAGNOSIS — K219 Gastro-esophageal reflux disease without esophagitis: Secondary | ICD-10-CM

## 2024-07-20 DIAGNOSIS — E782 Mixed hyperlipidemia: Secondary | ICD-10-CM

## 2024-07-20 DIAGNOSIS — R7303 Prediabetes: Secondary | ICD-10-CM | POA: Insufficient documentation

## 2024-07-20 DIAGNOSIS — R030 Elevated blood-pressure reading, without diagnosis of hypertension: Secondary | ICD-10-CM

## 2024-07-20 DIAGNOSIS — G4709 Other insomnia: Secondary | ICD-10-CM | POA: Diagnosis not present

## 2024-07-20 DIAGNOSIS — I2510 Atherosclerotic heart disease of native coronary artery without angina pectoris: Secondary | ICD-10-CM | POA: Insufficient documentation

## 2024-07-20 DIAGNOSIS — I10 Essential (primary) hypertension: Secondary | ICD-10-CM

## 2024-07-20 DIAGNOSIS — G4700 Insomnia, unspecified: Secondary | ICD-10-CM

## 2024-07-20 MED ORDER — TRAZODONE HCL 50 MG PO TABS: 25.0000 mg | ORAL_TABLET | Freq: Every evening | ORAL | 3 refills | Status: AC | PRN

## 2024-07-20 NOTE — Assessment & Plan Note (Signed)
 Coronary artery disease, post-NSTEMI Post-NSTEMI managed with aspirin  81 mg daily, Plavix 75 mg daily, and Coenzyme Q10 10 mg daily. No current chest pain reported. - Continue aspirin  81 mg daily. - Continue Plavix 75 mg daily. - Continue Coenzyme Q10 10 mg daily

## 2024-07-20 NOTE — Assessment & Plan Note (Addendum)
 Chronic hyperlipidemia managed with statin therapy. Current regimen includes Lupitor 80 mg daily and omega-3 fatty acids. - Continue Lipitor 80 mg daily. - Continue omega-3 fatty acids.

## 2024-07-20 NOTE — Assessment & Plan Note (Signed)
 Insomnia Chronic insomnia with difficulty falling and staying asleep. Previous use of Ambien was discontinued due to allergy. Trazodone  was effective in the past but discontinued 6-9 months ago. Discussed alternative options including mirtazapine and hydroxyzine, but trazodone  is preferred due to its efficacy and safety profile. Trazodone  is not in the same family as Ambien and does not carry the same risk of dependency. Discussed potential side effects of trazodone , including anxiety relief and non-addictive nature. - Prescribed trazodone  with dosing flexibility: 25 mg, 50 mg, or 100 mg as needed.

## 2024-07-20 NOTE — Patient Instructions (Addendum)
 [facility] [phone]  [address] [place], [place]  [address]    To keep you healthy, please keep in mind the following health maintenance items that you are due for:   Health Maintenance Due  Topic Date Due   Medicare Annual Wellness (AWV)  Never done     Best Wishes,   Dr. Lang

## 2024-07-20 NOTE — Assessment & Plan Note (Signed)
 Prediabetes, Chronic  A1c previously at 5.9%. Farxiga  10 mg daily is being used for blood sugar management and has shown improvement in symptoms such as reduced foot swelling and improved abdominal comfort. - Continue Farxiga  10 mg daily. - Ordered A1c test to monitor blood sugar levels.

## 2024-07-20 NOTE — Assessment & Plan Note (Signed)
 Chronic  Intermittent symptoms GERD managed with pepcid 20mg  OTC  -DC protonix  40mg   -continue pepcid 20mg  PRN

## 2024-07-20 NOTE — Assessment & Plan Note (Addendum)
 Essential hypertension Chronic  Hypertension well-controlled with current regimen. Blood pressure today is 119/78 mmHg. Current medications include losartan  50 mg daily and metoprolol  25 mg daily. - Continue losartan  50 mg daily. - Continue metoprolol  25 mg daily.

## 2024-07-20 NOTE — Progress Notes (Signed)
 Established patient visit   Patient: Richard Warren   DOB: [birth year]   68 y.o. Male  MRN: [id number] Visit Date: 07/20/2024  Today's healthcare provider: Rockie Agent, MD   Chief Complaint  Patient presents with   Medical Management of Chronic Issues    Patient is present for 4 month f/u htn. Patient is fasting today and is able to have labs done. Feeling well since last OV    Hypertension   Insomnia    Patient notes some issues with sleep, problems with falling and staying asleep. Would like to try medication again if able   Subjective     HPI     Medical Management of Chronic Issues    Additional comments: Patient is present for 4 month f/u htn. Patient is fasting today and is able to have labs done. Feeling well since last OV         Insomnia    Additional comments: Patient notes some issues with sleep, problems with falling and staying asleep. Would like to try medication again if able      Last edited by Cherry Chiquita HERO, CMA on 07/20/2024  1:02 PM.       Discussed the use of AI scribe software for clinical note transcription with the patient, who gave verbal consent to proceed.  History of Present Illness Richard Warren is a 68 year old male with insomnia who presents with sleep disturbances.  He has ongoing insomnia, characterized by difficulty falling asleep and staying asleep. He recalls a past allergic reaction to Ambien and mentions that trazodone  was used in the past, though he does not remember taking it. He has been off trazodone  for about six to nine months.  He has a history of coronary artery disease and a past NSTEMI, for which he continues to take aspirin  81 mg daily and Plavix 75 mg daily. He also takes Coenzyme Q10 and omega-3 fatty acids for cholesterol management. No chest pain is reported.  He has a history of hyperlipidemia and is on Lipitor 80 mg daily. He also takes losartan  50 mg daily and metoprolol  25 mg daily  for hypertension, which is currently well-controlled with a blood pressure of 119/78.  He has a history of esophageal reflux and takes Protonix  40 mg daily, though he mentions using Pepcid as well. He reports improvement in swallowing difficulties.  He mentions a history of colonic polyps and sinus arrest. He is concerned about the cost of follow-up procedures due to past insurance issues.  He takes Farxiga  10 mg daily, which he reports has helped reduce swelling in his feet, a problem he has had for thirty years. He notes that his belly feels better and less bloated since starting Farxiga .  He has a family history of breast cancer, with his sister having passed away from the disease.     Past Medical History:  Diagnosis Date   Colon polyp    GERD (gastroesophageal reflux disease)    Herpes zoster without complication 07/08/2007   History of kidney stones    Hyperlipidemia    NSTEMI 03/02/2022   Personal history of kidney stones 04/18/2018    Medications: Outpatient Medications Prior to Visit  Medication Sig   aspirin  EC 81 MG tablet Take 1 tablet (81 mg total) by mouth daily. Swallow whole.   atorvastatin  (LIPITOR) 80 MG tablet Take 1 tablet (80 mg total) by mouth every evening.   clopidogrel (PLAVIX) 75 MG tablet Take 1  tablet (75 mg total) by mouth daily.   Coenzyme Q10 (CO Q 10 PO) Take by mouth.   dapagliflozin  propanediol (FARXIGA ) 10 MG TABS tablet Take 1 tablet (10 mg total) by mouth daily before breakfast.   famotidine (PEPCID) 20 MG tablet Take 20 mg by mouth 2 (two) times daily.   losartan  (COZAAR ) 50 MG tablet Take 1 tablet (50 mg total) by mouth 2 (two) times daily.   metoprolol  succinate (TOPROL -XL) 25 MG 24 hr tablet Take 1 tablet (25 mg total) by mouth daily.   Omega-3 Fatty Acids (OMEGA 3 PO) Take 3,000 mg by mouth 2 (two) times daily.   selenium 50 MCG TABS tablet Take 200 mcg by mouth daily.   [DISCONTINUED] acidophilus (RISAQUAD) CAPS capsule Take 1 capsule by  mouth daily.   [DISCONTINUED] pantoprazole  (PROTONIX ) 40 MG tablet Take 1 tablet (40 mg total) by mouth daily.   [DISCONTINUED] Azelaic Acid 15 % gel Apply 1 Application topically daily.   [DISCONTINUED] cyclobenzaprine (FLEXERIL) 5 MG tablet Take 5 mg by mouth 3 (three) times daily.   No facility-administered medications prior to visit.    Review of Systems  Last metabolic panel Lab Results  Component Value Date   GLUCOSE 95 02/21/2024   NA 144 02/21/2024   K 4.1 02/21/2024   CL 107 (H) 02/21/2024   CO2 20 02/21/2024   BUN 12 02/21/2024   CREATININE 0.75 (L) 02/21/2024   EGFR 100 02/21/2024   CALCIUM  9.0 02/21/2024   PROT 6.6 02/21/2024   ALBUMIN 4.2 02/21/2024   LABGLOB 2.4 02/21/2024   AGRATIO 1.7 01/11/2023   BILITOT 0.6 02/21/2024   ALKPHOS 62 02/21/2024   AST 17 02/21/2024   ALT 19 02/21/2024   ANIONGAP 11 11/15/2023   Last lipids Lab Results  Component Value Date   CHOL 157 02/21/2024   HDL 46 02/21/2024   LDLCALC 83 02/21/2024   TRIG 160 (H) 02/21/2024   CHOLHDL 3.4 02/21/2024   Last hemoglobin A1c Lab Results  Component Value Date   HGBA1C 5.9 (H) 02/21/2024   Last thyroid functions Lab Results  Component Value Date   TSH 1.090 10/18/2023   FREET4 1.18 10/18/2023   Last vitamin D No results found for: 25OHVITD2, 25OHVITD3, VD25OH Last vitamin B12 and Folate No results found for: VITAMINB12, FOLATE      Objective    BP 119/78 (BP Location: Right Arm, Patient Position: Sitting, Cuff Size: Normal)   Pulse 60   Temp 97.9 F (36.6 C) (Oral)   Ht 5' 9 (1.753 m)   Wt 206 lb 8 oz (93.7 kg)   SpO2 99%   BMI 30.49 kg/m   BP Readings from Last 3 Encounters:  07/20/24 119/78  05/26/24 126/70  03/24/24 (!) 140/80   Wt Readings from Last 3 Encounters:  07/20/24 206 lb 8 oz (93.7 kg)  05/26/24 204 lb (92.5 kg)  03/24/24 207 lb (93.9 kg)        Physical Exam  Physical Exam VITALS: BP- 119/78 CHEST: Clear to auscultation  bilaterally, no wheezes or crackles. CARDIOVASCULAR: Normal heart rate and rhythm, S1 and S2 normal without murmurs. EXTREMITIES: left LE has trace edema compared to RLE    No results found for any visits on 07/20/24.  Assessment & Plan     Problem List Items Addressed This Visit     RESOLVED: Cannot sleep - Primary   RESOLVED: Combined fat and carbohydrate induced hyperlipemia   Esophageal reflux   Chronic  Intermittent symptoms GERD  managed with pepcid 20mg  OTC  -DC protonix  40mg   -continue pepcid 20mg  PRN       Relevant Medications   famotidine (PEPCID) 20 MG tablet   History of non-ST elevation myocardial infarction (NSTEMI)   Coronary artery disease, post-NSTEMI Post-NSTEMI managed with aspirin  81 mg daily, Plavix 75 mg daily, and Coenzyme Q10 10 mg daily. No current chest pain reported. - Continue aspirin  81 mg daily. - Continue Plavix 75 mg daily. - Continue Coenzyme Q10 10 mg daily      HTN, goal below 130/80   Essential hypertension Chronic  Hypertension well-controlled with current regimen. Blood pressure today is 119/78 mmHg. Current medications include losartan  50 mg daily and metoprolol  25 mg daily. - Continue losartan  50 mg daily. - Continue metoprolol  25 mg daily.      Hypercholesteremia   Chronic hyperlipidemia managed with statin therapy. Current regimen includes Lupitor 80 mg daily and omega-3 fatty acids. - Continue Lipitor 80 mg daily. - Continue omega-3 fatty acids.      Relevant Orders   Lipid panel   Insomnia, persistent   Insomnia Chronic insomnia with difficulty falling and staying asleep. Previous use of Ambien was discontinued due to allergy. Trazodone  was effective in the past but discontinued 6-9 months ago. Discussed alternative options including mirtazapine and hydroxyzine, but trazodone  is preferred due to its efficacy and safety profile. Trazodone  is not in the same family as Ambien and does not carry the same risk of dependency.  Discussed potential side effects of trazodone , including anxiety relief and non-addictive nature. - Prescribed trazodone  with dosing flexibility: 25 mg, 50 mg, or 100 mg as needed.      Relevant Medications   traZODone  (DESYREL ) 50 MG tablet   On statin therapy   Prediabetes (Chronic)   Prediabetes, Chronic  A1c previously at 5.9%. Farxiga  10 mg daily is being used for blood sugar management and has shown improvement in symptoms such as reduced foot swelling and improved abdominal comfort. - Continue Farxiga  10 mg daily. - Ordered A1c test to monitor blood sugar levels.      Relevant Orders   Hemoglobin A1c    Assessment and Plan Assessment & Plan   General Health Maintenance Annual wellness exam recommended. Discussed the importance of regular health maintenance and screenings. He is due for a colonoscopy follow-up due to previous findings of sessile polyps. - Schedule annual wellness exam within the next six months. - Contact GI office to schedule follow-up colonoscopy.     Return in about 6 months (around 01/17/2025) for AWV .         Rockie Agent, MD  [facility] [phone] (phone) [phone] (fax)  [facility]

## 2024-07-21 LAB — LIPID PANEL
Chol/HDL Ratio: 3 ratio (ref 0.0–5.0)
Cholesterol, Total: 149 mg/dL (ref 100–199)
HDL: 50 mg/dL (ref >39)
LDL Chol Calc (NIH): 80 mg/dL (ref 0–99)
Triglycerides: 104 mg/dL (ref 0–149)
VLDL Cholesterol Cal: 19 mg/dL (ref 5–40)

## 2024-07-21 LAB — CMP14+EGFR
ALT: 19 IU/L (ref 0–44)
AST: 20 IU/L (ref 0–40)
Albumin: 4.3 g/dL (ref 3.9–4.9)
Alkaline Phosphatase: 53 IU/L (ref 47–123)
BUN/Creatinine Ratio: 15 (ref 10–24)
BUN: 15 mg/dL (ref 8–27)
Bilirubin Total: 0.7 mg/dL (ref 0.0–1.2)
CO2: 23 mmol/L (ref 20–29)
Calcium: 9.3 mg/dL (ref 8.6–10.2)
Chloride: 103 mmol/L (ref 96–106)
Creatinine, Ser: 1.03 mg/dL (ref 0.76–1.27)
Globulin, Total: 2.7 g/dL (ref 1.5–4.5)
Glucose: 89 mg/dL (ref 70–99)
Potassium: 4.4 mmol/L (ref 3.5–5.2)
Sodium: 142 mmol/L (ref 134–144)
Total Protein: 7 g/dL (ref 6.0–8.5)
eGFR: 80 mL/min/1.73 (ref >59)

## 2024-07-21 LAB — HEMOGLOBIN A1C
Est. average glucose Bld gHb Est-mCnc: 120 mg/dL
Hgb A1c MFr Bld: 5.8 % — ABNORMAL HIGH (ref 4.8–5.6)

## 2024-07-27 ENCOUNTER — Ambulatory Visit: Payer: Self-pay | Admitting: Family Medicine

## 2024-08-21 ENCOUNTER — Encounter: Payer: Self-pay | Admitting: Cardiovascular Disease

## 2024-08-21 ENCOUNTER — Ambulatory Visit: Admitting: Cardiovascular Disease

## 2024-08-21 VITALS — BP 123/75 | HR 67 | Ht 69.0 in | Wt 207.4 lb

## 2024-08-21 DIAGNOSIS — Z131 Encounter for screening for diabetes mellitus: Secondary | ICD-10-CM

## 2024-08-21 DIAGNOSIS — R0602 Shortness of breath: Secondary | ICD-10-CM

## 2024-08-21 DIAGNOSIS — I5033 Acute on chronic diastolic (congestive) heart failure: Secondary | ICD-10-CM

## 2024-08-21 DIAGNOSIS — I10 Essential (primary) hypertension: Secondary | ICD-10-CM

## 2024-08-21 DIAGNOSIS — E7800 Pure hypercholesterolemia, unspecified: Secondary | ICD-10-CM

## 2024-08-21 DIAGNOSIS — I249 Acute ischemic heart disease, unspecified: Secondary | ICD-10-CM

## 2024-08-21 DIAGNOSIS — I455 Other specified heart block: Secondary | ICD-10-CM

## 2024-08-21 NOTE — Progress Notes (Signed)
 Cardiology Office Note   Date:  08/21/2024   ID:  Richard Warren, DOB 08-15-1956, MRN [id number]  PCP:  Sharma Coyer, MD  Cardiologist:  Denyse Bathe, MD      History of Present Illness: Richard Warren is a 68 y.o. male who presents for  Chief Complaint  Patient presents with   Follow-up    3 month follow up    Doing fine.      Past Medical History:  Diagnosis Date   Colon polyp    GERD (gastroesophageal reflux disease)    Herpes zoster without complication 07/08/2007   History of kidney stones    Hyperlipidemia    NSTEMI 03/02/2022   Personal history of kidney stones 04/18/2018     Past Surgical History:  Procedure Laterality Date   APPENDECTOMY  1968?   COLONOSCOPY  06/28/2009   Dr Dessa   COLONOSCOPY WITH PROPOFOL  N/A 06/28/2023   Procedure: COLONOSCOPY WITH PROPOFOL ;  Surgeon: Maryruth Ole DASEN, MD;  Location: [facility] ENDOSCOPY;  Service: Endoscopy;  Laterality: N/A;   CORONARY ANGIOPLASTY     CORONARY STENT INTERVENTION N/A 03/03/2022   Procedure: CORONARY STENT INTERVENTION;  Surgeon: Mady Bruckner, MD;  Location: [facility] INVASIVE CV LAB;  Service: Cardiovascular;  Laterality: N/A;   ESOPHAGOGASTRODUODENOSCOPY (EGD) WITH PROPOFOL  N/A 06/28/2023   Procedure: ESOPHAGOGASTRODUODENOSCOPY (EGD) WITH PROPOFOL ;  Surgeon: Maryruth Ole DASEN, MD;  Location: [facility] ENDOSCOPY;  Service: Endoscopy;  Laterality: N/A;   EXTRACORPOREAL SHOCK WAVE LITHOTRIPSY Left 01/07/2017   Procedure: EXTRACORPOREAL SHOCK WAVE LITHOTRIPSY (ESWL);  Surgeon: Redell Lynwood Napoleon, MD;  Location: [facility] ORS;  Service: Urology;  Laterality: Left;   HEMOSTASIS CLIP PLACEMENT  06/28/2023   Procedure: HEMOSTASIS CLIP PLACEMENT;  Surgeon: Maryruth Ole DASEN, MD;  Location: [facility] ENDOSCOPY;  Service: Endoscopy;;   LEFT HEART CATH AND CORONARY ANGIOGRAPHY N/A 03/03/2022   Procedure: LEFT HEART CATH AND CORONARY ANGIOGRAPHY;  Surgeon: Bathe Denyse LABOR, MD;  Location: [facility] INVASIVE  CV LAB;  Service: Cardiovascular;  Laterality: N/A;   POLYPECTOMY  06/28/2023   Procedure: POLYPECTOMY;  Surgeon: Maryruth Ole DASEN, MD;  Location: [facility] ENDOSCOPY;  Service: Endoscopy;;   SUBMUCOSAL LIFTING INJECTION  06/28/2023   Procedure: SUBMUCOSAL LIFTING INJECTION;  Surgeon: Maryruth Ole DASEN, MD;  Location: [facility] ENDOSCOPY;  Service: Endoscopy;;   SUBMUCOSAL TATTOO INJECTION  06/28/2023   Procedure: SUBMUCOSAL TATTOO INJECTION;  Surgeon: Maryruth Ole DASEN, MD;  Location: [facility] ENDOSCOPY;  Service: Endoscopy;;   UPPER GI ENDOSCOPY  06/28/2009   Dr Dessa   VASECTOMY       Current Outpatient Medications  Medication Sig Dispense Refill   aspirin  EC 81 MG tablet Take 1 tablet (81 mg total) by mouth daily. Swallow whole. 30 tablet 12   atorvastatin  (LIPITOR) 80 MG tablet Take 1 tablet (80 mg total) by mouth every evening. 90 tablet 1   clopidogrel  (PLAVIX ) 75 MG tablet Take 1 tablet (75 mg total) by mouth daily. 90 tablet 0   Coenzyme Q10 (CO Q 10 PO) Take by mouth.     dapagliflozin  propanediol (FARXIGA ) 10 MG TABS tablet Take 1 tablet (10 mg total) by mouth daily before breakfast. 30 tablet 3   famotidine (PEPCID) 20 MG tablet Take 20 mg by mouth 2 (two) times daily.     losartan  (COZAAR ) 50 MG tablet Take 1 tablet (50 mg total) by mouth 2 (two) times daily. 60 tablet 11   metoprolol  succinate (TOPROL -XL) 25 MG 24 hr tablet Take 1 tablet (25 mg total) by  mouth daily. 30 tablet 0   Omega-3 Fatty Acids (OMEGA 3 PO) Take 3,000 mg by mouth 2 (two) times daily.     selenium 50 MCG TABS tablet Take 200 mcg by mouth daily.     traZODone  (DESYREL ) 50 MG tablet Take 0.5-1 tablets (25-50 mg total) by mouth at bedtime as needed for sleep. 30 tablet 3   No current facility-administered medications for this visit.    Allergies:   Zolpidem    Social History:   reports that he has never smoked. He has never used smokeless tobacco. He reports current alcohol use of about 53.0 standard  drinks of alcohol per week. He reports that he does not use drugs.   Family History:  family history includes Coronary artery disease in his sister; Dementia in his mother; Heart attack in his sister; Hypertension in his brother.    ROS:     Review of Systems  Constitutional: Negative.   HENT: Negative.    Eyes: Negative.   Respiratory: Negative.    Gastrointestinal: Negative.   Genitourinary: Negative.   Musculoskeletal: Negative.   Skin: Negative.   Neurological: Negative.   Endo/Heme/Allergies: Negative.   Psychiatric/Behavioral: Negative.    All other systems reviewed and are negative.     All other systems are reviewed and negative.    PHYSICAL EXAM: VS:  BP 123/75   Pulse 67   Ht 5' 9 (1.753 m)   Wt 207 lb 6.4 oz (94.1 kg)   SpO2 97%   BMI 30.63 kg/m  , BMI Body mass index is 30.63 kg/m. Last weight:  Wt Readings from Last 3 Encounters:  08/21/24 207 lb 6.4 oz (94.1 kg)  07/20/24 206 lb 8 oz (93.7 kg)  05/26/24 204 lb (92.5 kg)     Physical Exam Vitals reviewed.  Constitutional:      Appearance: Normal appearance. He is normal weight.  HENT:     Head: Normocephalic.     Nose: Nose normal.     Mouth/Throat:     Mouth: Mucous membranes are moist.  Eyes:     Pupils: Pupils are equal, round, and reactive to light.  Cardiovascular:     Rate and Rhythm: Normal rate and regular rhythm.     Pulses: Normal pulses.     Heart sounds: Normal heart sounds.  Pulmonary:     Effort: Pulmonary effort is normal.  Abdominal:     General: Abdomen is flat. Bowel sounds are normal.  Musculoskeletal:        General: Normal range of motion.     Cervical back: Normal range of motion.  Skin:    General: Skin is warm.  Neurological:     General: No focal deficit present.     Mental Status: He is alert.  Psychiatric:        Mood and Affect: Mood normal.       EKG:   Recent Labs: 10/18/2023: TSH 1.090 02/21/2024: Hemoglobin 14.4; Platelets 201 07/20/2024: ALT  19; BUN 15; Creatinine, Ser 1.03; Potassium 4.4; Sodium 142    Lipid Panel    Component Value Date/Time   CHOL 149 07/20/2024 1358   TRIG 104 07/20/2024 1358   HDL 50 07/20/2024 1358   CHOLHDL 3.0 07/20/2024 1358   CHOLHDL 4.9 03/04/2022 0512   VLDL 25 03/04/2022 0512   LDLCALC 80 07/20/2024 1358   LDLCALC 110 (H) 05/14/2017 1556      Other studies Reviewed: Additional studies/ records that were reviewed today include:  Review of the above records demonstrates:       No data to display            ASSESSMENT AND PLAN:    ICD-10-CM   1. SOB (shortness of breath)  R06.02    farxiga  helped with SOB.    2. Primary hypertension  I10     3. NSTEMI  I24.9     4. Sinus arrest  I45.5     5. CHF (congestive heart failure), NYHA class III, acute on chronic, diastolic (HCC)  I50.33     6. Hypercholesteremia  E78.00        Problem List Items Addressed This Visit       Cardiovascular and Mediastinum   Sinus arrest     Other   Hypercholesteremia   Other Visit Diagnoses       SOB (shortness of breath)    -  Primary   farxiga  helped with SOB.     Primary hypertension         NSTEMI         CHF (congestive heart failure), NYHA class III, acute on chronic, diastolic (HCC)              Disposition:   Return in about 3 months (around 11/19/2024).    Total time spent: 35 minutes  Signed,  Denyse Bathe, MD  08/21/2024 1:44 PM    [facility]

## 2024-09-25 ENCOUNTER — Other Ambulatory Visit: Payer: Self-pay

## 2024-09-25 DIAGNOSIS — I2510 Atherosclerotic heart disease of native coronary artery without angina pectoris: Secondary | ICD-10-CM

## 2024-09-25 DIAGNOSIS — I249 Acute ischemic heart disease, unspecified: Secondary | ICD-10-CM

## 2024-09-25 DIAGNOSIS — R0602 Shortness of breath: Secondary | ICD-10-CM

## 2024-09-25 DIAGNOSIS — I10 Essential (primary) hypertension: Secondary | ICD-10-CM

## 2024-09-25 DIAGNOSIS — E7800 Pure hypercholesterolemia, unspecified: Secondary | ICD-10-CM

## 2024-09-26 MED ORDER — CLOPIDOGREL BISULFATE 75 MG PO TABS: 75.0000 mg | ORAL_TABLET | Freq: Every day | ORAL | 0 refills | Status: AC

## 2024-09-26 MED ORDER — METOPROLOL SUCCINATE ER 25 MG PO TB24: 25.0000 mg | ORAL_TABLET | Freq: Every day | ORAL | 0 refills | Status: AC

## 2024-09-27 ENCOUNTER — Other Ambulatory Visit: Payer: Self-pay | Admitting: Cardiovascular Disease

## 2024-09-27 ENCOUNTER — Ambulatory Visit: Admitting: Family Medicine

## 2024-09-27 ENCOUNTER — Encounter: Payer: Self-pay | Admitting: Family Medicine

## 2024-09-27 VITALS — BP 103/66 | HR 89 | Resp 16 | Ht 69.0 in | Wt 200.0 lb

## 2024-09-27 DIAGNOSIS — F1011 Alcohol abuse, in remission: Secondary | ICD-10-CM | POA: Diagnosis not present

## 2024-09-27 DIAGNOSIS — I249 Acute ischemic heart disease, unspecified: Secondary | ICD-10-CM

## 2024-09-27 DIAGNOSIS — R0602 Shortness of breath: Secondary | ICD-10-CM

## 2024-09-27 DIAGNOSIS — R1085 Abdominal pain of multiple sites: Secondary | ICD-10-CM

## 2024-09-27 DIAGNOSIS — I10 Essential (primary) hypertension: Secondary | ICD-10-CM

## 2024-09-27 DIAGNOSIS — M545 Low back pain, unspecified: Secondary | ICD-10-CM | POA: Diagnosis not present

## 2024-09-27 DIAGNOSIS — I2510 Atherosclerotic heart disease of native coronary artery without angina pectoris: Secondary | ICD-10-CM

## 2024-09-27 DIAGNOSIS — E7800 Pure hypercholesterolemia, unspecified: Secondary | ICD-10-CM

## 2024-09-27 MED ORDER — SUCRALFATE 1 G PO TABS: 1.0000 g | ORAL_TABLET | Freq: Three times a day (TID) | ORAL | 0 refills | Status: AC

## 2024-09-27 NOTE — Progress Notes (Signed)
 "     Established patient visit   Patient: Richard Warren   DOB: 10-25-1955   69 y.o. Male  MRN: [id number] Visit Date: 09/27/2024  Today's healthcare provider: Nancyann Perry, MD   Chief Complaint  Patient presents with   Acute Visit   Back Pain     Lower back and sides (5-6 weeks) ( Possible Xray?) Pt also wants something for a possible Ulcers?   Subjective    Discussed the use of AI scribe software for clinical note transcription with the patient, who gave verbal consent to proceed.  History of Present Illness   Richard Warren is a 69 year old male who presents with back pain and abdominal pain.  He has been experiencing back pain for about a month, which worsens when sitting for long periods or upon getting out of bed. The pain is located off to the sides, particularly along the waistline, and is more pronounced on the right side.  He also has abdominal pain that has been present for several weeks, located in the epigastric region, described as sometimes burning and sharp. This pain is exacerbated by eating, making it difficult for him to eat. He has a history of an ulcer many years ago and has not been taking famotidine recently as it was not providing sufficient relief.  He quit drinking alcohol about six weeks ago, having previously consumed four to five shots a night. He does not take over-the-counter pain medications like ibuprofen or naproxen  due to concerns about their side effects.  He had a CT scan of his abdomen last March following a fall from a truck, which resulted in a spine injury, a knee injury, and a broken wrist. He still has his gallbladder and has not had it removed.       Medications: Show/hide medication list[1] Review of Systems  Constitutional:  Negative for appetite change, chills and fever.  Respiratory:  Negative for chest tightness, shortness of breath and wheezing.   Cardiovascular:  Negative for chest pain and palpitations.   Gastrointestinal:  Negative for abdominal pain, nausea and vomiting.       Objective    BP 103/66 (BP Location: Left Arm, Patient Position: Sitting)   Pulse 89   Resp 16   Ht 5' 9 (1.753 m)   Wt 200 lb (90.7 kg)   SpO2 98%   BMI 29.53 kg/m   Physical Exam   General Appearance:    Well developed, well nourished male, alert, cooperative, in no acute distress  Eyes:    PERRL, conjunctiva/corneas clear, EOM's intact       Lungs:     Clear to auscultation bilaterally, respirations unlabored  Heart:    Normal heart rate. Normal rhythm. No murmurs, rubs, or gallops.    Abdomen:   soft, round. Moderate diffusely distended, mild tenderness. SABRA No CVA tenderness     Assessment & Plan     1. Abdominal pain of multiple sites (Primary) Differential diagnosis includes intraabdominal mass, gastritis, and pancreatits. - CBC - Comprehensive metabolic panel with GFR - Lipase - Amylase - H. pylori breath test - US  Abdomen Complete; Future  - sucralfate  (CARAFATE ) 1 g tablet; Take 1 tablet (1 g total) by mouth 4 (four) times daily -  with meals and at bedtime.  Dispense: 120 tablet; Refill: 0  2. Acute bilateral low back pain without sciatica  - DG Lumbar Spine Complete; Future  3. History of alcohol abuse  Nancyann Perry, MD  [facility] [phone] (phone) [phone] (fax)  [facility]    [1]  Outpatient Medications Prior to Visit  Medication Sig   aspirin  EC 81 MG tablet Take 1 tablet (81 mg total) by mouth daily. Swallow whole.   atorvastatin  (LIPITOR) 80 MG tablet Take 1 tablet (80 mg total) by mouth every evening.   clopidogrel  (PLAVIX ) 75 MG tablet Take 1 tablet (75 mg total) by mouth daily.   Coenzyme Q10 (CO Q 10 PO) Take by mouth.   dapagliflozin  propanediol (FARXIGA ) 10 MG TABS tablet Take 1 tablet (10 mg total) by mouth daily before breakfast.   famotidine (PEPCID) 20 MG tablet Take 20 mg by mouth 2 (two) times  daily.   losartan  (COZAAR ) 50 MG tablet Take 1 tablet (50 mg total) by mouth 2 (two) times daily.   metoprolol  succinate (TOPROL -XL) 25 MG 24 hr tablet Take 1 tablet (25 mg total) by mouth daily.   Omega-3 Fatty Acids (OMEGA 3 PO) Take 3,000 mg by mouth 2 (two) times daily.   selenium 50 MCG TABS tablet Take 200 mcg by mouth daily.   traZODone  (DESYREL ) 50 MG tablet Take 0.5-1 tablets (25-50 mg total) by mouth at bedtime as needed for sleep.   No facility-administered medications prior to visit.   "

## 2024-09-27 NOTE — Patient Instructions (Signed)
Please review the attached list of medications and notify my office if there are any errors.   Go to the [facility] on [address] for low back Xray  

## 2024-09-28 MED ORDER — ATORVASTATIN CALCIUM 80 MG PO TABS: 80.0000 mg | ORAL_TABLET | Freq: Every evening | ORAL | 0 refills | Status: AC

## 2024-09-29 ENCOUNTER — Ambulatory Visit
Admission: RE | Admit: 2024-09-29 | Discharge: 2024-09-29 | Disposition: A | Source: Ambulatory Visit | Attending: Family Medicine | Admitting: Family Medicine

## 2024-09-29 ENCOUNTER — Ambulatory Visit: Payer: Self-pay | Admitting: Family Medicine

## 2024-09-29 DIAGNOSIS — M545 Low back pain, unspecified: Secondary | ICD-10-CM

## 2024-09-29 DIAGNOSIS — R1085 Abdominal pain of multiple sites: Secondary | ICD-10-CM | POA: Insufficient documentation

## 2024-09-29 LAB — COMPREHENSIVE METABOLIC PANEL WITH GFR
ALT: 50 IU/L — ABNORMAL HIGH (ref 0–44)
AST: 23 IU/L (ref 0–40)
Albumin: 4.5 g/dL (ref 3.9–4.9)
Alkaline Phosphatase: 58 IU/L (ref 47–123)
BUN/Creatinine Ratio: 12 (ref 10–24)
BUN: 11 mg/dL (ref 8–27)
Bilirubin Total: 0.5 mg/dL (ref 0.0–1.2)
CO2: 21 mmol/L (ref 20–29)
Calcium: 9.5 mg/dL (ref 8.6–10.2)
Chloride: 104 mmol/L (ref 96–106)
Creatinine, Ser: 0.93 mg/dL (ref 0.76–1.27)
Globulin, Total: 2.5 g/dL (ref 1.5–4.5)
Glucose: 89 mg/dL (ref 70–99)
Potassium: 4.2 mmol/L (ref 3.5–5.2)
Sodium: 142 mmol/L (ref 134–144)
Total Protein: 7 g/dL (ref 6.0–8.5)
eGFR: 90 mL/min/1.73

## 2024-09-29 LAB — CBC
Hematocrit: 47.6 % (ref 37.5–51.0)
Hemoglobin: 15.7 g/dL (ref 13.0–17.7)
MCH: 32.1 pg (ref 26.6–33.0)
MCHC: 33 g/dL (ref 31.5–35.7)
MCV: 97 fL (ref 79–97)
Platelets: 249 x10E3/uL (ref 150–450)
RBC: 4.89 x10E6/uL (ref 4.14–5.80)
RDW: 11.9 % (ref 11.6–15.4)
WBC: 4.5 x10E3/uL (ref 3.4–10.8)

## 2024-09-29 LAB — H. PYLORI BREATH TEST

## 2024-09-29 LAB — LIPASE: Lipase: 32 U/L (ref 13–78)

## 2024-09-29 LAB — AMYLASE: Amylase: 89 U/L (ref 31–110)

## 2024-11-21 ENCOUNTER — Ambulatory Visit: Admitting: Cardiovascular Disease

## 2025-01-17 ENCOUNTER — Encounter: Admitting: Family Medicine
